# Patient Record
Sex: Male | Born: 1945 | Race: White | Hispanic: No | Marital: Married | Smoking: Former smoker
Health system: Southern US, Community
[De-identification: ages and names within clinical notes are randomized; demographics above are authoritative.]

## PROBLEM LIST (undated history)

## (undated) DIAGNOSIS — J189 Pneumonia, unspecified organism: Secondary | ICD-10-CM

## (undated) DIAGNOSIS — E785 Hyperlipidemia, unspecified: Secondary | ICD-10-CM

## (undated) DIAGNOSIS — H269 Unspecified cataract: Secondary | ICD-10-CM

## (undated) DIAGNOSIS — C801 Malignant (primary) neoplasm, unspecified: Secondary | ICD-10-CM

## (undated) DIAGNOSIS — L409 Psoriasis, unspecified: Secondary | ICD-10-CM

## (undated) DIAGNOSIS — I10 Essential (primary) hypertension: Secondary | ICD-10-CM

## (undated) DIAGNOSIS — H9190 Unspecified hearing loss, unspecified ear: Secondary | ICD-10-CM

## (undated) DIAGNOSIS — M1990 Unspecified osteoarthritis, unspecified site: Secondary | ICD-10-CM

## (undated) DIAGNOSIS — I447 Left bundle-branch block, unspecified: Secondary | ICD-10-CM

## (undated) DIAGNOSIS — R011 Cardiac murmur, unspecified: Secondary | ICD-10-CM

## (undated) HISTORY — DX: Unspecified cataract: H26.9

## (undated) HISTORY — PX: OTHER SURGICAL HISTORY: SHX169

## (undated) HISTORY — PX: COLONOSCOPY: SHX174

## (undated) HISTORY — DX: Unspecified osteoarthritis, unspecified site: M19.90

## (undated) HISTORY — DX: Hyperlipidemia, unspecified: E78.5

---

## 2002-12-24 ENCOUNTER — Encounter: Payer: Self-pay | Admitting: Gastroenterology

## 2002-12-24 ENCOUNTER — Encounter: Admission: RE | Admit: 2002-12-24 | Discharge: 2002-12-24 | Payer: Self-pay | Admitting: Gastroenterology

## 2009-10-11 ENCOUNTER — Ambulatory Visit (HOSPITAL_COMMUNITY): Admission: RE | Admit: 2009-10-11 | Discharge: 2009-10-11 | Payer: Self-pay | Admitting: Cardiology

## 2009-10-11 HISTORY — PX: CARDIAC CATHETERIZATION: SHX172

## 2013-02-26 ENCOUNTER — Other Ambulatory Visit: Payer: Self-pay

## 2013-02-26 DIAGNOSIS — R1030 Lower abdominal pain, unspecified: Secondary | ICD-10-CM

## 2013-02-27 ENCOUNTER — Ambulatory Visit
Admission: RE | Admit: 2013-02-27 | Discharge: 2013-02-27 | Disposition: A | Discharge status: Home / Self Care | Payer: Medicare Other | Source: Ambulatory Visit

## 2013-02-27 DIAGNOSIS — R1030 Lower abdominal pain, unspecified: Secondary | ICD-10-CM

## 2013-03-11 ENCOUNTER — Ambulatory Visit (INDEPENDENT_AMBULATORY_CARE_PROVIDER_SITE_OTHER): Payer: Self-pay | Admitting: General Surgery

## 2013-04-04 ENCOUNTER — Ambulatory Visit (INDEPENDENT_AMBULATORY_CARE_PROVIDER_SITE_OTHER): Payer: Medicare Other | Admitting: General Surgery

## 2013-04-04 ENCOUNTER — Encounter (INDEPENDENT_AMBULATORY_CARE_PROVIDER_SITE_OTHER): Payer: Self-pay | Admitting: General Surgery

## 2013-04-04 VITALS — BP 160/98 | HR 82 | Resp 16 | Ht 71.0 in | Wt 220.4 lb

## 2013-04-04 DIAGNOSIS — D171 Benign lipomatous neoplasm of skin and subcutaneous tissue of trunk: Secondary | ICD-10-CM

## 2013-04-04 DIAGNOSIS — D1739 Benign lipomatous neoplasm of skin and subcutaneous tissue of other sites: Secondary | ICD-10-CM

## 2013-04-04 NOTE — Progress Notes (Signed)
Patient ID: Paul Pineda, male   DOB: [birth year], 67 y.o.   MRN: [id number]  Chief Complaint  Patient presents with  . New Evaluation    eval lipoma in groin area    HPI Paul Pineda is a 67 y.o. male.  The patient is a 67 year old male who was referred by Dr. Kateri Plummer for an evaluation of the right inguinal lipoma. Patient has undergone ultrasound which reveals a lipoma. Patient states she is uncomfortable sensation in that area. He states it does not reduce and is not get smaller.  HPI  Past Medical History  Diagnosis Date  . Arthritis   . Hyperlipidemia     History reviewed. No pertinent past surgical history.  Family History  Problem Relation Age of Onset  . Cancer Father     brain    Social History History  Substance Use Topics  . Smoking status: Former Smoker    Quit date: 04/04/1985  . Smokeless tobacco: Never Used  . Alcohol Use: 8.4 oz/week    14 Cans of beer per week    No Known Allergies  Current Outpatient Prescriptions  Medication Sig Dispense Refill  . ENBREL 25 MG injection       . lisinopril (PRINIVIL,ZESTRIL) 20 MG tablet       . metoprolol succinate (TOPROL-XL) 100 MG 24 hr tablet       . fluorouracil (EFUDEX) 5 % cream       . predniSONE (DELTASONE) 10 MG tablet        No current facility-administered medications for this visit.    Review of Systems Review of Systems  Constitutional: Negative.   HENT: Negative.   Respiratory: Negative.   Cardiovascular: Negative.   Gastrointestinal: Negative.   Neurological: Negative.   All other systems reviewed and are negative.    Blood pressure 160/98, pulse 82, resp. rate 16, height 5\' 11"  (1.803 m), weight 220 lb 6.4 oz (99.973 kg).  Physical Exam Physical Exam  Constitutional: He is oriented to person, place, and time. He appears well-developed and well-nourished.  HENT:  Head: Normocephalic and atraumatic.  Eyes: EOM are normal. Pupils are equal, round, and reactive to light.  Neck:  Normal range of motion. Neck supple.  Cardiovascular: Normal rate, regular rhythm and normal heart sounds.   Pulmonary/Chest: Effort normal and breath sounds normal.  Abdominal: Soft. Bowel sounds are normal. No hernia. Hernia confirmed negative in the ventral area.    Musculoskeletal: Normal range of motion.  Neurological: He is alert and oriented to person, place, and time.  Skin: Skin is warm and dry.    Data Reviewed Ultrasound reveals signs consistent with a lipoma  Assessment    67 year old male with a right inguinal lipoma versus a possible hernia     Plan    1. We'll proceed to the operating room for excision of right inguinal lipoma. I discussed with patient this could be a hernia at which time we would proceed with a right inguinal hernia repair. 2. All risks and benefits were discussed with the patient, to generally include infection, bleeding, damage to surrounding structures, acute and chronic nerve pain, and recurrence. Alternatives were offered and described.  All questions were answered and the patient voiced understanding of the procedure and wishes to proceed at this point.         Marigene Ehlers., Paul Pineda 04/04/2013, 1:48 PM

## 2013-04-23 ENCOUNTER — Encounter (HOSPITAL_COMMUNITY): Payer: Self-pay | Admitting: Pharmacy Technician

## 2013-04-25 NOTE — Pre-Procedure Instructions (Addendum)
[facility]  04/25/2013   Your procedure is scheduled on:  04/29/13  Report to  short stay admitting at 845 AM.  Call this number if you have problems the morning of surgery: [phone]   Remember:   Do not eat food or drink liquids after midnight.   Take these medicines the morning of surgery with A SIP OF WATER: metoprolol   Do not wear jewelry, make-up or nail polish.  Do not wear lotions, powders, or perfumes. You may wear deodorant.  Do not shave 48 hours prior to surgery. Men may shave face and neck.  Do not bring valuables to the hospital.  [facility] is not responsible                  for any belongings or valuables.               Contacts, dentures or bridgework may not be worn into surgery.  Leave suitcase in the car. After surgery it may be brought to your room.  For patients admitted to the hospital, discharge time is determined by your                treatment team.               Patients discharged the day of surgery will not be allowed to drive  home.  Name and phone number of your driver:   Special Instructions: Shower using CHG 2 nights before surgery and the night before surgery.  If you shower the day of surgery use CHG.  Use special wash - you have one bottle of CHG for all showers.  You should use approximately 1/3 of the bottle for each shower.   Please read over the following fact sheets that you were given: Pain Booklet, Coughing and Deep Breathing and Surgical Site Infection Prevention

## 2013-04-28 ENCOUNTER — Encounter (HOSPITAL_COMMUNITY)
Admission: RE | Admit: 2013-04-28 | Discharge: 2013-04-28 | Disposition: A | Discharge status: Home / Self Care | Payer: Medicare Other | Source: Ambulatory Visit | Attending: General Surgery | Admitting: General Surgery

## 2013-04-28 ENCOUNTER — Encounter (HOSPITAL_COMMUNITY)
Admission: RE | Admit: 2013-04-28 | Discharge: 2013-04-28 | Disposition: A | Discharge status: Home / Self Care | Payer: Medicare Other | Source: Ambulatory Visit | Attending: Anesthesiology | Admitting: Anesthesiology

## 2013-04-28 ENCOUNTER — Encounter (HOSPITAL_COMMUNITY): Payer: Self-pay

## 2013-04-28 HISTORY — DX: Malignant (primary) neoplasm, unspecified: C80.1

## 2013-04-28 HISTORY — DX: Pneumonia, unspecified organism: J18.9

## 2013-04-28 HISTORY — DX: Cardiac murmur, unspecified: R01.1

## 2013-04-28 HISTORY — DX: Left bundle-branch block, unspecified: I44.7

## 2013-04-28 HISTORY — DX: Essential (primary) hypertension: I10

## 2013-04-28 LAB — CBC
MCHC: 34.6 g/dL (ref 30.0–36.0)
MCV: 100.4 fL — ABNORMAL HIGH (ref 78.0–100.0)
Platelets: 143 10*3/uL — ABNORMAL LOW (ref 150–400)
RDW: 13.6 % (ref 11.5–15.5)
WBC: 6.7 10*3/uL (ref 4.0–10.5)

## 2013-04-28 LAB — BASIC METABOLIC PANEL
BUN: 16 mg/dL (ref 6–23)
CO2: 24 mEq/L (ref 19–32)
Calcium: 9.2 mg/dL (ref 8.4–10.5)
Chloride: 105 mEq/L (ref 96–112)
Creatinine, Ser: 1 mg/dL (ref 0.50–1.35)
GFR calc Af Amer: 89 mL/min — ABNORMAL LOW (ref >90)
Glucose, Bld: 91 mg/dL (ref 70–99)

## 2013-04-28 LAB — HEPATIC FUNCTION PANEL
AST: 62 U/L — ABNORMAL HIGH (ref 0–37)
Bilirubin, Direct: 0.2 mg/dL (ref 0.0–0.3)

## 2013-04-28 MED ORDER — CEFAZOLIN SODIUM-DEXTROSE 2-3 GM-% IV SOLR
2.0000 g | INTRAVENOUS | Status: DC
  Filled 2013-04-28: qty 50

## 2013-04-28 NOTE — Progress Notes (Signed)
Anesthesia Chart Review:  Patient is a 67 year old male scheduled for right inguinal lipoma removal, possible right IHR on 04/29/13 by Dr. Derrell Lolling.  PAT appointment was this morning (04/28/13).  I was not asked to see patient, but was given his chart to review this afternoon.  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 Donato Schultz. He ultimately had a cardiac cath to evaluate for LV dysfunction.  Cath showed no significant CAD, EF 45-50%.  He was started on ACEI and b-blocker therapy.  Current medication list includes lisinopril and Toprol.   Following an abnormal nuclear stress test showing no ischemia, but EF 37% in 2011, patient underwent a cardiac cath on 10/11/09 that showed no angiographically significant CAD, mildly reduced EF of 45-50% with no wall motion abnormalities, no significant MR,  Insignificant peak-to-peak gradient across the aortic valve.  Mildly reduced EF was felt to be either due to HTN or possible ETOH use, and he was treated medically with ACEI and b-blocker therapy.    Echo on 10/01/09 showed normal Lv chamber size with moderately reduced contraction, anteroseptal wall hypokinesis, LVEF 40-45%, mild MR, trivial TR, grade I diastolic dysfunction without elevated LA pressure.  EKG on 04/28/13 showed NSR, LAD, left BBB. 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   CXR on 04/28/13 showed no acute findings.  Preoperative labs noted.  Cr 1.0, glucose 91.  H/H WNL.  PLT 143K.  AST/ALT 62, 66 (HFP added due to history of ETOH use/fatty liver).  Patient has known left BBB with cardiology evaluation in 2011 showing no significant CAD with mildly reduced EF of 45-50%.  He remains on b-blocker and ACEI therapy, and was referred to [facility] by his PCP.   He will be evaluated by his assigned anesthesiologist on the day of surgery, but if no acute CV/CHF symptoms then I would anticipate that he could proceed as planned.  Velna Ochs [facility] Short Stay Center/Anesthesiology Phone [phone] 04/28/2013 3:01 PM

## 2013-04-28 NOTE — Progress Notes (Signed)
req'd notes, cath from [facility] card 2012 or 2013

## 2013-04-29 ENCOUNTER — Ambulatory Visit (HOSPITAL_COMMUNITY): Payer: Medicare Other | Admitting: Anesthesiology

## 2013-04-29 ENCOUNTER — Ambulatory Visit (HOSPITAL_COMMUNITY)
Admission: RE | Admit: 2013-04-29 | Discharge: 2013-04-29 | Disposition: A | Discharge status: Home / Self Care | Payer: Medicare Other | Source: Ambulatory Visit | Attending: General Surgery | Admitting: General Surgery

## 2013-04-29 ENCOUNTER — Encounter (HOSPITAL_COMMUNITY): Payer: Self-pay | Admitting: *Deleted

## 2013-04-29 ENCOUNTER — Encounter (HOSPITAL_COMMUNITY): Payer: Self-pay | Admitting: Vascular Surgery

## 2013-04-29 ENCOUNTER — Encounter (HOSPITAL_COMMUNITY)
Admission: RE | Disposition: A | Discharge status: Home / Self Care | Payer: Self-pay | Source: Ambulatory Visit | Attending: General Surgery

## 2013-04-29 DIAGNOSIS — Z79899 Other long term (current) drug therapy: Secondary | ICD-10-CM | POA: Insufficient documentation

## 2013-04-29 DIAGNOSIS — D176 Benign lipomatous neoplasm of spermatic cord: Secondary | ICD-10-CM | POA: Insufficient documentation

## 2013-04-29 DIAGNOSIS — Z01818 Encounter for other preprocedural examination: Secondary | ICD-10-CM | POA: Insufficient documentation

## 2013-04-29 DIAGNOSIS — K4090 Unilateral inguinal hernia, without obstruction or gangrene, not specified as recurrent: Secondary | ICD-10-CM

## 2013-04-29 DIAGNOSIS — D1739 Benign lipomatous neoplasm of skin and subcutaneous tissue of other sites: Secondary | ICD-10-CM

## 2013-04-29 DIAGNOSIS — Z01812 Encounter for preprocedural laboratory examination: Secondary | ICD-10-CM | POA: Insufficient documentation

## 2013-04-29 DIAGNOSIS — Z01810 Encounter for preprocedural cardiovascular examination: Secondary | ICD-10-CM | POA: Insufficient documentation

## 2013-04-29 HISTORY — PX: INGUINAL HERNIA REPAIR: SHX194

## 2013-04-29 HISTORY — PX: INSERTION OF MESH: SHX5868

## 2013-04-29 SURGERY — INSERTION OF MESH
Anesthesia: General | Site: Groin | Laterality: Right | Wound class: Clean

## 2013-04-29 MED ORDER — SODIUM CHLORIDE 0.9 % IV SOLN: 12.5000 mg | INTRAVENOUS | Status: DC

## 2013-04-29 MED ORDER — BUPIVACAINE HCL (PF) 0.25 % IJ SOLN
INTRAMUSCULAR | Status: AC
  Filled 2013-04-29: qty 30

## 2013-04-29 MED ORDER — LIDOCAINE HCL (CARDIAC) 20 MG/ML IV SOLN
INTRAVENOUS | Status: DC | PRN
  Administered 2013-04-29: 50 mg via INTRAVENOUS

## 2013-04-29 MED ORDER — PROMETHAZINE HCL 12.5 MG PO TABS: 12.5000 mg | ORAL_TABLET | Freq: Once | ORAL | Status: AC

## 2013-04-29 MED ORDER — SODIUM CHLORIDE 0.9 % IJ SOLN: 3.0000 mL | Freq: Two times a day (BID) | INTRAMUSCULAR | Status: DC

## 2013-04-29 MED ORDER — SODIUM CHLORIDE 0.9 % IJ SOLN: 3.0000 mL | INTRAMUSCULAR | Status: DC | PRN

## 2013-04-29 MED ORDER — EPHEDRINE SULFATE 50 MG/ML IJ SOLN
INTRAMUSCULAR | Status: DC | PRN
  Administered 2013-04-29: 5 mg via INTRAVENOUS
  Administered 2013-04-29 (×2): 10 mg via INTRAVENOUS

## 2013-04-29 MED ORDER — SODIUM CHLORIDE 0.9 % IV SOLN: 250.0000 mL | INTRAVENOUS | Status: DC | PRN

## 2013-04-29 MED ORDER — OXYCODONE HCL 5 MG PO TABS
5.0000 mg | ORAL_TABLET | ORAL | Status: DC | PRN
  Administered 2013-04-29: 5 mg via ORAL

## 2013-04-29 MED ORDER — ACETAMINOPHEN 650 MG RE SUPP: 650.0000 mg | RECTAL | Status: DC | PRN

## 2013-04-29 MED ORDER — MIDAZOLAM HCL 5 MG/5ML IJ SOLN
INTRAMUSCULAR | Status: DC | PRN
  Administered 2013-04-29: 1 mg via INTRAVENOUS

## 2013-04-29 MED ORDER — ROCURONIUM BROMIDE 100 MG/10ML IV SOLN
INTRAVENOUS | Status: DC | PRN
  Administered 2013-04-29: 40 mg via INTRAVENOUS
  Administered 2013-04-29: 10 mg via INTRAVENOUS

## 2013-04-29 MED ORDER — HYDROMORPHONE HCL PF 1 MG/ML IJ SOLN
0.2500 mg | INTRAMUSCULAR | Status: DC | PRN
  Administered 2013-04-29 (×2): 0.5 mg via INTRAVENOUS

## 2013-04-29 MED ORDER — PROPOFOL 10 MG/ML IV BOLUS
INTRAVENOUS | Status: DC | PRN
  Administered 2013-04-29: 150 mg via INTRAVENOUS

## 2013-04-29 MED ORDER — HYDROMORPHONE HCL PF 1 MG/ML IJ SOLN
INTRAMUSCULAR | Status: AC
  Filled 2013-04-29: qty 1

## 2013-04-29 MED ORDER — ACETAMINOPHEN 325 MG PO TABS: 650.0000 mg | ORAL_TABLET | ORAL | Status: DC | PRN

## 2013-04-29 MED ORDER — 0.9 % SODIUM CHLORIDE (POUR BTL) OPTIME
TOPICAL | Status: DC | PRN
  Administered 2013-04-29: 1000 mL

## 2013-04-29 MED ORDER — NEOSTIGMINE METHYLSULFATE 1 MG/ML IJ SOLN
INTRAMUSCULAR | Status: DC | PRN
  Administered 2013-04-29: 4 mg via INTRAVENOUS

## 2013-04-29 MED ORDER — BUPIVACAINE-EPINEPHRINE PF 0.25-1:200000 % IJ SOLN
INTRAMUSCULAR | Status: DC | PRN
  Administered 2013-04-29: 19 mL

## 2013-04-29 MED ORDER — FENTANYL CITRATE 0.05 MG/ML IJ SOLN
INTRAMUSCULAR | Status: DC | PRN
  Administered 2013-04-29: 125 ug via INTRAVENOUS
  Administered 2013-04-29: 75 ug via INTRAVENOUS
  Administered 2013-04-29 (×2): 25 ug via INTRAVENOUS

## 2013-04-29 MED ORDER — CHLORHEXIDINE GLUCONATE 4 % EX LIQD: 1.0000 "application " | Freq: Once | CUTANEOUS | Status: DC

## 2013-04-29 MED ORDER — ONDANSETRON HCL 4 MG/2ML IJ SOLN: 4.0000 mg | Freq: Four times a day (QID) | INTRAMUSCULAR | Status: DC | PRN

## 2013-04-29 MED ORDER — PROMETHAZINE HCL 25 MG/ML IJ SOLN
12.5000 mg | Freq: Once | INTRAMUSCULAR | Status: AC
  Administered 2013-04-29: 12.5 mg via INTRAVENOUS

## 2013-04-29 MED ORDER — LACTATED RINGERS IV SOLN
INTRAVENOUS | Status: DC
  Administered 2013-04-29: 10:00:00 via INTRAVENOUS

## 2013-04-29 MED ORDER — GLYCOPYRROLATE 0.2 MG/ML IJ SOLN
INTRAMUSCULAR | Status: DC | PRN
  Administered 2013-04-29: .7 mg via INTRAVENOUS

## 2013-04-29 MED ORDER — ARTIFICIAL TEARS OP OINT
TOPICAL_OINTMENT | OPHTHALMIC | Status: DC | PRN
  Administered 2013-04-29: 1 via OPHTHALMIC

## 2013-04-29 MED ORDER — OXYCODONE HCL 5 MG PO TABS
ORAL_TABLET | ORAL | Status: AC
  Filled 2013-04-29: qty 1

## 2013-04-29 MED ORDER — ONDANSETRON HCL 4 MG/2ML IJ SOLN
INTRAMUSCULAR | Status: DC | PRN
  Administered 2013-04-29: 4 mg via INTRAVENOUS

## 2013-04-29 MED ORDER — OXYCODONE-ACETAMINOPHEN 10-325 MG PO TABS: 1.0000 | ORAL_TABLET | ORAL | Status: DC | PRN

## 2013-04-29 MED ORDER — PROMETHAZINE HCL 25 MG/ML IJ SOLN
INTRAMUSCULAR | Status: AC
  Administered 2013-04-29: 12.5 mg via INTRAVENOUS
  Filled 2013-04-29: qty 1

## 2013-04-29 MED ORDER — PROMETHAZINE HCL 12.5 MG RE SUPP: 12.5000 mg | Freq: Once | RECTAL | Status: AC

## 2013-04-29 MED ORDER — LACTATED RINGERS IV SOLN
INTRAVENOUS | Status: DC | PRN
  Administered 2013-04-29 (×2): via INTRAVENOUS

## 2013-04-29 SURGICAL SUPPLY — 62 items
ADH SKN CLS APL DERMABOND .7 (GAUZE/BANDAGES/DRESSINGS) ×2
BLADE SURG 10 STRL SS (BLADE) ×4 IMPLANT
BLADE SURG 15 STRL LF DISP TIS (BLADE) ×3 IMPLANT
BLADE SURG 15 STRL SS (BLADE) ×3
BLADE SURG ROTATE 9660 (MISCELLANEOUS) IMPLANT
CHLORAPREP W/TINT 26ML (MISCELLANEOUS) ×3 IMPLANT
CLOTH BEACON ORANGE TIMEOUT ST (SAFETY) ×4 IMPLANT
COVER SURGICAL LIGHT HANDLE (MISCELLANEOUS) ×4 IMPLANT
DERMABOND ADVANCED (GAUZE/BANDAGES/DRESSINGS) ×1
DERMABOND ADVANCED .7 DNX12 (GAUZE/BANDAGES/DRESSINGS) ×2 IMPLANT
DRAIN PENROSE 1/2X12 LTX STRL (WOUND CARE) IMPLANT
DRAPE LAPAROTOMY TRNSV 102X78 (DRAPE) ×3 IMPLANT
DRAPE ORTHO SPLIT 77X108 STRL (DRAPES)
DRAPE PED LAPAROTOMY (DRAPES) IMPLANT
DRAPE SURG ORHT 6 SPLT 77X108 (DRAPES) IMPLANT
DRAPE UTILITY 15X26 W/TAPE STR (DRAPE) ×6 IMPLANT
ELECT CAUTERY BLADE 6.4 (BLADE) ×4 IMPLANT
ELECT REM PT RETURN 9FT ADLT (ELECTROSURGICAL) ×3
ELECTRODE REM PT RTRN 9FT ADLT (ELECTROSURGICAL) ×3 IMPLANT
GLOVE BIO SURGEON STRL SZ7.5 (GLOVE) ×4 IMPLANT
GLOVE BIO SURGEON STRL SZ8 (GLOVE) ×2 IMPLANT
GLOVE BIOGEL PI IND STRL 7.0 (GLOVE) ×3 IMPLANT
GLOVE BIOGEL PI IND STRL 8 (GLOVE) ×4 IMPLANT
GLOVE BIOGEL PI INDICATOR 7.0 (GLOVE) ×3
GLOVE BIOGEL PI INDICATOR 8 (GLOVE) ×2
GLOVE SURG SS PI 6.5 STRL IVOR (GLOVE) ×2 IMPLANT
GLOVE SURG SS PI 7.0 STRL IVOR (GLOVE) ×2 IMPLANT
GOWN STRL NON-REIN LRG LVL3 (GOWN DISPOSABLE) ×7 IMPLANT
GOWN STRL REIN XL XLG (GOWN DISPOSABLE) ×6 IMPLANT
KIT BASIN OR (CUSTOM PROCEDURE TRAY) ×4 IMPLANT
KIT ROOM TURNOVER OR (KITS) ×4 IMPLANT
MESH ULTRAPRO 3X6 7.6X15CM (Mesh General) ×2 IMPLANT
NDL HYPO 25GX1X1/2 BEV (NEEDLE) ×1 IMPLANT
NEEDLE 22X1 1/2 (OR ONLY) (NEEDLE) ×2 IMPLANT
NEEDLE HYPO 25GX1X1/2 BEV (NEEDLE) ×3 IMPLANT
NS IRRIG 1000ML POUR BTL (IV SOLUTION) ×4 IMPLANT
PACK SURGICAL SETUP 50X90 (CUSTOM PROCEDURE TRAY) ×4 IMPLANT
PAD ARMBOARD 7.5X6 YLW CONV (MISCELLANEOUS) ×4 IMPLANT
PENCIL BUTTON HOLSTER BLD 10FT (ELECTRODE) ×4 IMPLANT
SPECIMEN JAR SMALL (MISCELLANEOUS) ×1 IMPLANT
SPONGE GAUZE 4X4 12PLY (GAUZE/BANDAGES/DRESSINGS) IMPLANT
SPONGE INTESTINAL PEANUT (DISPOSABLE) IMPLANT
SPONGE LAP 18X18 X RAY DECT (DISPOSABLE) ×4 IMPLANT
SUT MNCRL AB 4-0 PS2 18 (SUTURE) ×1 IMPLANT
SUT PDS AB 0 CT 36 (SUTURE) IMPLANT
SUT PROLENE 2 0 SH DA (SUTURE) ×1 IMPLANT
SUT SILK 2 0 SH (SUTURE) IMPLANT
SUT SILK 3 0 (SUTURE)
SUT SILK 3-0 18XBRD TIE 12 (SUTURE) ×1 IMPLANT
SUT VIC AB 0 CT2 27 (SUTURE) IMPLANT
SUT VIC AB 2-0 SH 27 (SUTURE)
SUT VIC AB 2-0 SH 27X BRD (SUTURE) ×1 IMPLANT
SUT VIC AB 3-0 SH 27 (SUTURE)
SUT VIC AB 3-0 SH 27XBRD (SUTURE) ×1 IMPLANT
SYR CONTROL 10ML LL (SYRINGE) ×3 IMPLANT
TOWEL OR 17X24 6PK STRL BLUE (TOWEL DISPOSABLE) ×4 IMPLANT
TOWEL OR 17X26 10 PK STRL BLUE (TOWEL DISPOSABLE) ×4 IMPLANT
TRAY FOLEY CATH 14FR (SET/KITS/TRAYS/PACK) ×2 IMPLANT
TRAY FOLEY CATH 16FRSI W/METER (SET/KITS/TRAYS/PACK) IMPLANT
TUBE CONNECTING 12X1/4 (SUCTIONS) IMPLANT
UNDERPAD 30X30 INCONTINENT (UNDERPADS AND DIAPERS) IMPLANT
YANKAUER SUCT BULB TIP NO VENT (SUCTIONS) IMPLANT

## 2013-04-29 NOTE — Progress Notes (Signed)
Dr Randa Evens called and informed of pt unable to remove gold colored ring to left ring finger, with several attempts.

## 2013-04-29 NOTE — Progress Notes (Signed)
Patient was nauseated after drinking several cups of water.  Anesthesiologist called, order obtained for Phenergan for nausea.  Pt stated nausea was much better after phenergan, and pain was 2/10 after dilaudid.

## 2013-04-29 NOTE — Interval H&P Note (Signed)
History and Physical Interval Note:  04/29/2013 10:52 AM  Paul Pineda  has presented today for surgery, with the diagnosis of hernia  The various methods of treatment have been discussed with the patient and family. After consideration of risks, benefits and other options for treatment, the patient has consented to  Procedure(s): RIGHT INGUINAL LIPOMA REMOVAL VS RIGHT INGUINAL HERNIA WITH MESH (Right) INSERTION OF MESH (Right) as a surgical intervention .  The patient's history has been reviewed, patient examined, no change in status, stable for surgery.  I have reviewed the patient's chart and labs.  Questions were answered to the patient's satisfaction.     Marigene Ehlers., Jed Limerick

## 2013-04-29 NOTE — Progress Notes (Signed)
Care of pt assumed by MA Karlisa Gaubert RN from L Yontz RN 

## 2013-04-29 NOTE — Anesthesia Preprocedure Evaluation (Addendum)
Anesthesia Evaluation  Patient identified by MRN, date of birth, ID band Patient awake    Reviewed: Allergy & Precautions, H&P , NPO status , Patient's Chart, lab work & pertinent test results  Airway Mallampati: II      Dental   Pulmonary pneumonia -, resolved,          Cardiovascular hypertension, Pt. on medications + dysrhythmias     Neuro/Psych    GI/Hepatic negative GI ROS, Neg liver ROS,   Endo/Other  negative endocrine ROS  Renal/GU negative Renal ROS     Musculoskeletal   Abdominal   Peds  Hematology   Anesthesia Other Findings   Reproductive/Obstetrics                         Anesthesia Physical Anesthesia Plan  ASA: III  Anesthesia Plan:    Post-op Pain Management:    Induction: Intravenous  Airway Management Planned: LMA  Additional Equipment:   Intra-op Plan:   Post-operative Plan: Extubation in OR  Informed Consent: I have reviewed the patients History and Physical, chart, labs and discussed the procedure including the risks, benefits and alternatives for the proposed anesthesia with the patient or authorized representative who has indicated his/her understanding and acceptance.   Dental advisory given  Plan Discussed with: CRNA, Anesthesiologist and Surgeon  Anesthesia Plan Comments:         Anesthesia Quick Evaluation

## 2013-04-29 NOTE — Op Note (Signed)
Pre Operative Diagnosis:  Right subcutaneous inguinal lipoma and questionable Right inguinal hernia  Post Operative Diagnosis: right subcutaneous inguinal lipoma, and a cord lipoma  Procedure: excision of a right subcutaneous inguinal lipomameasuring 9 0.5 x 3 cm in size, and a right inguinal hernia repair with mesh  Surgeon: Dr. Axel Filler  Assistant: none  Anesthesia: GETA  EBL: less than 5 cc  Complications: none  Counts: reported as correct x 2  Findings:  9.5 x 3 cm sub-q lipoma and a cord lipoma.  Indications for procedure:  The patient is a 67 year old male with a long history of a right inguinal subcutaneous lipoma. It is difficult to determine whether or not the patient had a small hernia that was felt to be on exam. After counseling the patient said to have this electively excised and hernia repaired it was present.  Details of the procedure: The patient was taken back to the operating room. The patient was placed in supine position with bilateral SCDs in place. After appropriate anitbiotics were confirmed, a time-out was confirmed and all facts were verified.  Quarter percent Marcaine was used to infiltrate the area of the incision and an ilioinguinal nerve block was also placed. A 5 cm incision was made just 1 cm superior to the inguinal ligament. Physician subcutaneous fat take place and the lipoma was defined and excised in its entirety. After excision of the lipoma measured approximately 9 cm x 3 cm.  I proceeded to dissect down to the hernia and bovie cautery was used to maintain hemostasis and dissection was carried down to the external oblique.  A standard incision was made laterally, and the external oblique was bluntly dissected away from the surrounding tissue with Metzenbaum scissors. Incision was made through the external oblique and lateral stab wound through the external ring. The external oblique was elevated in the spermatic cord was bluntly dissected away from  the surrounding tissue.  The ilioinguinal nerve was identified and ligated proximally. The spermatic cord and the hernia were then bluntly dissected away from the pubic tubercle and a Penrose was placed around the hernia sac in the spermatic cord. Dissection cremasterics to place a Bovie cautery.the spermatic cord was dissected away and the vas deferens was identified and protected all portions of the case. There is a small cord lipoma that was excised and suture-ligated. This retracted to the abdomen. At this time a 3 x 6 piece of Prolene mesh was then anchored to the pubic tubercle with a 2-0 Prolene.  It was anchored to the shelving edge of the external oblique.  The superior edge was fixated to the conjoint tendon with a running Prolene.  The tails were then cut into shape and tucked under the external oblique. The mesh surrounding the spermatic cord at the internal oblique was then tightened to allow approximately 1 fingerbreadth. At this time the area was irrigated out with sterile saline.  The external oblique was reapproximated using a 2-0 Vicryl in a running fashion. Scarpa's fascia was then reapproximated using a 3-0 Vicryl running fashion. The skin was then reapproximated with 4 Monocryl in a subcuticular fashion. The skin was then dressed with Dermabond.  The patient was taken to the recovery room in stable condition.

## 2013-04-29 NOTE — Preoperative (Signed)
Beta Blockers   Reason not to administer Beta Blockers:toprol taken today 

## 2013-04-29 NOTE — Anesthesia Postprocedure Evaluation (Signed)
  Anesthesia Post-op Note  Patient: Paul Pineda  Procedure(s) Performed: Procedure(s): INSERTION OF MESH (Right) HERNIA REPAIR INGUINAL ADULT (Right)  Patient Location: PACU  Anesthesia Type:General  Level of Consciousness: awake  Airway and Oxygen Therapy: Patient Spontanous Breathing  Post-op Pain: mild  Post-op Assessment: Post-op Vital signs reviewed  Post-op Vital Signs: stable  Complications: No apparent anesthesia complications

## 2013-04-29 NOTE — H&P (View-Only) (Signed)
Patient ID: Paul Pineda, male   DOB: September 15, 1945, 67 y.o.   MRN: [id number]  Chief Complaint  Patient presents with  . New Evaluation    eval lipoma in groin area    HPI Paul Pineda is a 67 y.o. male.  The patient is a 67 year old male who was referred by Dr. Kateri Plummer for an evaluation of the right inguinal lipoma. Patient has undergone ultrasound which reveals a lipoma. Patient states she is uncomfortable sensation in that area. He states it does not reduce and is not get smaller.  HPI  Past Medical History  Diagnosis Date  . Arthritis   . Hyperlipidemia     History reviewed. No pertinent past surgical history.  Family History  Problem Relation Age of Onset  . Cancer Father     brain    Social History History  Substance Use Topics  . Smoking status: Former Smoker    Quit date: 04/04/1985  . Smokeless tobacco: Never Used  . Alcohol Use: 8.4 oz/week    14 Cans of beer per week    No Known Allergies  Current Outpatient Prescriptions  Medication Sig Dispense Refill  . ENBREL 25 MG injection       . lisinopril (PRINIVIL,ZESTRIL) 20 MG tablet       . metoprolol succinate (TOPROL-XL) 100 MG 24 hr tablet       . fluorouracil (EFUDEX) 5 % cream       . predniSONE (DELTASONE) 10 MG tablet        No current facility-administered medications for this visit.    Review of Systems Review of Systems  Constitutional: Negative.   HENT: Negative.   Respiratory: Negative.   Cardiovascular: Negative.   Gastrointestinal: Negative.   Neurological: Negative.   All other systems reviewed and are negative.    Blood pressure 160/98, pulse 82, resp. rate 16, height 5\' 11"  (1.803 m), weight 220 lb 6.4 oz (99.973 kg).  Physical Exam Physical Exam  Constitutional: He is oriented to person, place, and time. He appears well-developed and well-nourished.  HENT:  Head: Normocephalic and atraumatic.  Eyes: EOM are normal. Pupils are equal, round, and reactive to light.  Neck:  Normal range of motion. Neck supple.  Cardiovascular: Normal rate, regular rhythm and normal heart sounds.   Pulmonary/Chest: Effort normal and breath sounds normal.  Abdominal: Soft. Bowel sounds are normal. No hernia. Hernia confirmed negative in the ventral area.    Musculoskeletal: Normal range of motion.  Neurological: He is alert and oriented to person, place, and time.  Skin: Skin is warm and dry.    Data Reviewed Ultrasound reveals signs consistent with a lipoma  Assessment    67 year old male with a right inguinal lipoma versus a possible hernia     Plan    1. We'll proceed to the operating room for excision of right inguinal lipoma. I discussed with patient this could be a hernia at which time we would proceed with a right inguinal hernia repair. 2. All risks and benefits were discussed with the patient, to generally include infection, bleeding, damage to surrounding structures, acute and chronic nerve pain, and recurrence. Alternatives were offered and described.  All questions were answered and the patient voiced understanding of the procedure and wishes to proceed at this point.         Marigene Ehlers., Shloka Baldridge 04/04/2013, 1:48 PM

## 2013-04-29 NOTE — Transfer of Care (Signed)
Immediate Anesthesia Transfer of Care Note  Patient: Paul Pineda  Procedure(s) Performed: Procedure(s): INSERTION OF MESH (Right) HERNIA REPAIR INGUINAL ADULT (Right)  Patient Location: PACU  Anesthesia Type:General  Level of Consciousness: awake, alert  and oriented  Airway & Oxygen Therapy: Patient Spontanous Breathing and Patient connected to nasal cannula oxygen  Post-op Assessment: Report given to PACU RN and Post -op Vital signs reviewed and stable  Post vital signs: Reviewed and stable  Complications: No apparent anesthesia complications

## 2013-04-29 NOTE — Progress Notes (Signed)
Dr Derrell Lolling called and informed of the gold band to pt left ring finger.  Spoke with Asher Muir in the OR, to let the Dr know.

## 2013-05-02 ENCOUNTER — Encounter (HOSPITAL_COMMUNITY): Payer: Self-pay | Admitting: General Surgery

## 2013-05-19 ENCOUNTER — Encounter (INDEPENDENT_AMBULATORY_CARE_PROVIDER_SITE_OTHER): Payer: Medicare Other | Admitting: General Surgery

## 2013-06-05 ENCOUNTER — Other Ambulatory Visit: Payer: Self-pay

## 2013-06-23 NOTE — Progress Notes (Signed)
Surgery scheduled for 07/14/13.  Preop on 07/04/13 at 100pm.  Need orders in EPIC.  Thank You.

## 2013-07-01 ENCOUNTER — Encounter (HOSPITAL_COMMUNITY): Payer: Self-pay | Admitting: Pharmacy Technician

## 2013-07-01 ENCOUNTER — Other Ambulatory Visit: Payer: Self-pay | Admitting: Orthopedic Surgery

## 2013-07-01 NOTE — Progress Notes (Signed)
Preoperative surgical orders have been place into the Epic hospital system for Paul Pineda on 07/01/2013, 9:39 AM  by Patrica Duel for surgery on 07-14-2013.  Preop Total Knee orders including Experal, PO Tylenol, and IV Decadron as long as there are no contraindications to the above medications. Avel Peace, PA-C

## 2013-07-02 NOTE — Patient Instructions (Addendum)
20      Your procedure is scheduled on:  Monday 07/14/2013  Report to [facility] at 0730 AM.  Call this number if you have problems the night before or morning of surgery: [phone]   Remember:             IF YOU USE CPAP,BRING MASK AND TUBING AM OF SURGERY!             IF YOU DO NOT HAVE YOUR TYPE AND SCREEN DRAWN AT PRE-ADMIT APPOINTMENT, YOU WILL HAVE IT DRAWN AM OF SURGERY!   Do not eat food or drink liquids AFTER MIDNIGHT!  Take these medicines the morning of surgery with A SIP OF WATER: Metoprolol    [facility] IS NOT RESPONSIBLE FOR ANY BELONGINGS OR VALUABLES BROUGHT TO HOSPITAL.  Marland Kitchen  Leave suitcase in the car. After surgery it may be brought to your room.  For patients admitted to the hospital, checkout time is 11:00 AM the day of              Discharge.    DO NOT WEAR JEWELRY,MAKE-UP,LOTIONS,POWDERS,PERFUMES,CONTACTS , DENTURES OR BRIDGEWORK ,AND DO NOT WEAR FALSE EYELASHES                                    Patients discharged the day of surgery will not be allowed to drive home.  If going home the same day of surgery, must have someone stay with you first 24 hrs.at home and arrange for someone to drive you home from the              Hospital.              YOUR DRIVER IS: N/A   Special Instructions:              Please read over the following fact sheets that you were given:             1. [place] PREPARING FOR SURGERY SHEET              2.INCENTIVE SPIROMETRY                                        [place].Bertice Risse,RN,BSN     [id number]                FAILURE TO FOLLOW THESE INSTRUCTIONS MAY RESULT IN  CANCELLATION OF YOUR SURGERY!               Patient Signature:___________________________

## 2013-07-04 ENCOUNTER — Encounter (HOSPITAL_COMMUNITY)
Admission: RE | Admit: 2013-07-04 | Discharge: 2013-07-04 | Disposition: A | Discharge status: Home / Self Care | Payer: Medicare Other | Source: Ambulatory Visit | Attending: Orthopedic Surgery | Admitting: Orthopedic Surgery

## 2013-07-04 ENCOUNTER — Encounter (HOSPITAL_COMMUNITY): Payer: Self-pay

## 2013-07-04 DIAGNOSIS — M1710 Unilateral primary osteoarthritis, unspecified knee: Secondary | ICD-10-CM | POA: Insufficient documentation

## 2013-07-04 DIAGNOSIS — Z01812 Encounter for preprocedural laboratory examination: Secondary | ICD-10-CM | POA: Insufficient documentation

## 2013-07-04 LAB — URINALYSIS, ROUTINE W REFLEX MICROSCOPIC
Nitrite: NEGATIVE
Protein, ur: 30 mg/dL — AB
Urobilinogen, UA: 1 mg/dL (ref 0.0–1.0)

## 2013-07-04 LAB — SURGICAL PCR SCREEN
MRSA, PCR: NEGATIVE
Staphylococcus aureus: POSITIVE — AB

## 2013-07-04 LAB — COMPREHENSIVE METABOLIC PANEL
ALT: 72 U/L — ABNORMAL HIGH (ref 0–53)
BUN: 20 mg/dL (ref 6–23)
CO2: 25 mEq/L (ref 19–32)
Calcium: 10.1 mg/dL (ref 8.4–10.5)
GFR calc Af Amer: 73 mL/min — ABNORMAL LOW (ref >90)
GFR calc non Af Amer: 63 mL/min — ABNORMAL LOW (ref >90)
Glucose, Bld: 93 mg/dL (ref 70–99)
Sodium: 141 mEq/L (ref 135–145)
Total Protein: 7.5 g/dL (ref 6.0–8.3)

## 2013-07-04 LAB — PROTIME-INR
INR: 0.96 (ref 0.00–1.49)
Prothrombin Time: 12.6 seconds (ref 11.6–15.2)

## 2013-07-04 LAB — CBC
Hemoglobin: 17.4 g/dL — ABNORMAL HIGH (ref 13.0–17.0)
MCH: 34.7 pg — ABNORMAL HIGH (ref 26.0–34.0)
MCHC: 34.7 g/dL (ref 30.0–36.0)
RDW: 12.5 % (ref 11.5–15.5)

## 2013-07-04 LAB — APTT: aPTT: 27 seconds (ref 24–37)

## 2013-07-04 LAB — URINE MICROSCOPIC-ADD ON

## 2013-07-04 NOTE — Progress Notes (Signed)
09/27/2009-EKG FROM [facility],DR.MARK SKAINS ON CHART AND OFFICE VISIT ON CHART. 10/01/2009-NUCLEAR STRESS TEST AND ECHOCARDIOGRAM  FROM [facility] ON CHART. 10/11/2009-CARDIAC CATHERIZATION FROM [place] ON CHART. 10-18-2009-F/U OFFICE VISIT FROM CATH FROM [facility] ON CHART.

## 2013-07-07 NOTE — Progress Notes (Signed)
Fax sent to Dr. 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

## 2013-07-13 ENCOUNTER — Other Ambulatory Visit: Payer: Self-pay | Admitting: Orthopedic Surgery

## 2013-07-13 NOTE — H&P (Signed)
Paul Pineda  DOB: 05/28/1946 Married / Language: English / Race: White Male  Date of Admission:  07-14-2013  Chief Complaint:  Right Knee Pain  History of Present Illness The patient is a 67 year old male who comes in for a preoperative History and Physical. The patient is scheduled for a right total knee arthroplasty to be performed by Dr. Frank V. [name], MD at [facility] on 07/14/2013. The patient was seen for a second opinion. The patient reports right knee symptoms including: pain, swelling, instability, giving way and stiffness which began year(s) ago without any known injury.The patient feels that the symptoms are worsening. The patient has the current diagnosis of knee osteoarthritis. Previous work-up for this problem has included knee x-rays. Past treatment for this problem has included intra-articular injection of corticosteroids (last injection was 07/07/09). Current treatment includes application of ice, restricted activity and nonsteroidal anti-inflammatory drugs (Naproxen).  He is a truck driver. He notes that after driving for a long distance, he feels like his knee cannot support him when he first gets up. He states the previous cortisone injections have only helped for a short period. He also has a history of psoriatic arthritis. The patient's knee has gotten progressively worse over time. He is at a stage where it is hurting him all the time. He has pain at night and during the day. It is limiting what he can and cannot do. He still drives long distance doing truck deliveries. He does not have to do any loading or unloading of the truck. he is not having any significant swelling in the knee. It does give out on him at times. He is not having any left knee pain,hip pain, low back pain, lower extremity weakness or paresthesias associated with this. He has been treated previously by Dr. Dalldorf with injections. The first injection lasted a long time  for several months, then had another injection which did not help as much. He is at a stage now where the knee is far worse than it was when he had the injections. The patient would like to be more active, but cannot do so because of the pain. He is ready to proceed with surgery. Risks and benefits of the surgery have been discussed with the patient and they elect to proceed with surgery.  There are on active contraindications to upcoming procedure such as ongoing infection or progressive neurological disease.   Problem List/Past Medical Primary osteoarthritis of one knee (715.16) High blood pressure Skin Cancer  Allergies No Known Drug Allergies. 05/01/2013    Family History Cancer. Father. father    Social History Illicit drug use. no Living situation. live with spouse Drug/Alcohol Rehab (Previously). no Exercise. Exercises never Marital status. married Tobacco / smoke exposure. no Number of flights of stairs before winded. 2-3 Pain Contract. no Drug/Alcohol Rehab (Currently). no Tobacco use. Former smoker. former smoker; smoke(d) 2 pack(s) per day Children. 2 Current work status. working full time Alcohol use. current drinker; drinks beer and wine; 5-7 per week Post-Surgical Plans. Home Advance Directives. Living Will    Medication History Enbrel ( Subcutaneous) Specific dose unknown - Active. Metoprolol Succinate ER ( Oral) Specific dose unknown - Active. Lisinopril ( Oral) Specific dose unknown - Active. Multivitamins ( Oral) Active.    Past Surgical History Inguinal Hernia Repair. open: right   Review of Systems General:Not Present- Chills, Fever, Night Sweats, Fatigue, Weight Gain, Weight Loss and Memory Loss. Skin:Not Present- Hives, Itching, Rash, Eczema and Lesions.   HEENT:Not Present- Tinnitus, Headache, Double Vision, Visual Loss, Hearing Loss and Dentures. Respiratory:Not Present- Shortness of breath with exertion,  Shortness of breath at rest, Allergies, Coughing up blood and Chronic Cough. Cardiovascular:Not Present- Chest Pain, Racing/skipping heartbeats, Difficulty Breathing Lying Down, Murmur, Swelling and Palpitations. Gastrointestinal:Not Present- Bloody Stool, Heartburn, Abdominal Pain, Vomiting, Nausea, Constipation, Diarrhea, Difficulty Swallowing, Jaundice and Loss of appetitie. Male Genitourinary:Not Present- Urinary frequency, Blood in Urine, Weak urinary stream, Discharge, Flank Pain, Incontinence, Painful Urination, Urgency, Urinary Retention and Urinating at Night. Musculoskeletal:Present- Muscle Pain and Joint Swelling. Not Present- Muscle Weakness, Joint Pain, Back Pain, Morning Stiffness and Spasms. Neurological:Not Present- Tremor, Dizziness, Blackout spells, Paralysis, Difficulty with balance and Weakness. Psychiatric:Not Present- Insomnia.    Vitals Weight: 227 lb Height: 71 in Weight was reported by patient. Height was reported by patient. Body Surface Area: 2.27 m Body Mass Index: 31.66 kg/m Pulse: 68 (Regular) Resp.: 16 (Unlabored) BP: 138/82 (Sitting, Right Arm, Standard)     Physical Exam The physical exam findings are as follows:   General Mental Status - Alert, cooperative and good historian. General Appearance- pleasant. Not in acute distress. Orientation- Oriented X3. Build & Nutrition- Well nourished and Well developed.   Head and Neck Head- normocephalic, atraumatic . Neck Global Assessment- supple. no bruit auscultated on the right and no bruit auscultated on the left.   Eye Vision- Wears corrective lenses. Pupil- Bilateral- Regular and Round. Motion- Bilateral- EOMI.   Chest and Lung Exam Auscultation: Breath sounds:- clear at anterior chest wall and - clear at posterior chest wall. Adventitious sounds:- No Adventitious sounds.   Cardiovascular Auscultation:Rhythm- Regular rate and rhythm. Heart  Sounds- S1 WNL and S2 WNL. Murmurs & Other Heart Sounds: Murmur 1:Location- Aortic Area and Pulmonic Area. Timing- Holosystolic. Grade- III/VI. Character- Crescendo/Decrescendo.   Abdomen Palpation/Percussion:Tenderness- Abdomen is non-tender to palpation. Rigidity (guarding)- Abdomen is soft. Auscultation:Auscultation of the abdomen reveals - Bowel sounds normal.   Male Genitourinary Not done, not pertinent to present illness  Musculoskeletal On exam well developed male alert and oriented in no apparent distress. His hip shows normal range of motion with no discomfort. The left knee no effusion. Range 0 to 140. No tenderness or instability. The right knee has a lot of soft tissue hypertrophy. There is a small effusion. Range about 10 to 120 with marked crepitus on range of motion. Tenderness medial greater than lateral. No instability noted. Pulses sensation and motor intact distally.  RADIOGRAPHS: AP both knees and lateral of the right show the severe endstage arthritis of the right knee tricompartmental bone on bone with large osteophyte formation. The left knee looks normal.   Assessment & Plan Primary osteoarthritis of one knee (715.16) Impression: Right Knee  Note: Plan is for a Right Total Knee Replacement by Dr. [name].  Plan is to go home.  The patient does not have any contraindications and will receive TXA (tranexamic acid) prior to surgery.  Signed electronically by Jaevion Goto L Lynard Postlewait, III PA-C  

## 2013-07-14 ENCOUNTER — Encounter (HOSPITAL_COMMUNITY): Payer: Medicare Other | Admitting: Registered Nurse

## 2013-07-14 ENCOUNTER — Encounter (HOSPITAL_COMMUNITY)
Admission: RE | Disposition: A | Discharge status: Home Health | Payer: Self-pay | Source: Ambulatory Visit | Attending: Orthopedic Surgery

## 2013-07-14 ENCOUNTER — Inpatient Hospital Stay (HOSPITAL_COMMUNITY)
Admission: RE | Admit: 2013-07-14 | Discharge: 2013-07-16 | DRG: 470 | Disposition: A | Discharge status: Home Health | Payer: Medicare Other | Source: Ambulatory Visit | Attending: Orthopedic Surgery | Admitting: Orthopedic Surgery

## 2013-07-14 ENCOUNTER — Encounter (HOSPITAL_COMMUNITY): Payer: Self-pay

## 2013-07-14 ENCOUNTER — Inpatient Hospital Stay (HOSPITAL_COMMUNITY): Payer: Medicare Other | Admitting: Registered Nurse

## 2013-07-14 DIAGNOSIS — Z96651 Presence of right artificial knee joint: Secondary | ICD-10-CM

## 2013-07-14 DIAGNOSIS — E785 Hyperlipidemia, unspecified: Secondary | ICD-10-CM | POA: Diagnosis present

## 2013-07-14 DIAGNOSIS — M1710 Unilateral primary osteoarthritis, unspecified knee: Principal | ICD-10-CM | POA: Diagnosis present

## 2013-07-14 DIAGNOSIS — Z87891 Personal history of nicotine dependence: Secondary | ICD-10-CM

## 2013-07-14 DIAGNOSIS — D62 Acute posthemorrhagic anemia: Secondary | ICD-10-CM | POA: Diagnosis not present

## 2013-07-14 DIAGNOSIS — I447 Left bundle-branch block, unspecified: Secondary | ICD-10-CM | POA: Diagnosis present

## 2013-07-14 DIAGNOSIS — M179 Osteoarthritis of knee, unspecified: Secondary | ICD-10-CM | POA: Diagnosis present

## 2013-07-14 DIAGNOSIS — Z79899 Other long term (current) drug therapy: Secondary | ICD-10-CM

## 2013-07-14 DIAGNOSIS — R011 Cardiac murmur, unspecified: Secondary | ICD-10-CM | POA: Diagnosis present

## 2013-07-14 DIAGNOSIS — I10 Essential (primary) hypertension: Secondary | ICD-10-CM | POA: Diagnosis present

## 2013-07-14 HISTORY — PX: TOTAL KNEE ARTHROPLASTY: SHX125

## 2013-07-14 SURGERY — ARTHROPLASTY, KNEE, TOTAL
Anesthesia: General | Site: Knee | Laterality: Right

## 2013-07-14 MED ORDER — PHENYLEPHRINE HCL 10 MG/ML IJ SOLN
INTRAMUSCULAR | Status: DC | PRN
  Administered 2013-07-14 (×2): 80 ug via INTRAVENOUS
  Administered 2013-07-14 (×2): 20 ug via INTRAVENOUS

## 2013-07-14 MED ORDER — PROPOFOL 10 MG/ML IV BOLUS
INTRAVENOUS | Status: AC
  Filled 2013-07-14: qty 20

## 2013-07-14 MED ORDER — LIDOCAINE HCL (CARDIAC) 20 MG/ML IV SOLN
INTRAVENOUS | Status: AC
  Filled 2013-07-14: qty 5

## 2013-07-14 MED ORDER — DEXAMETHASONE SODIUM PHOSPHATE 10 MG/ML IJ SOLN
10.0000 mg | Freq: Once | INTRAMUSCULAR | Status: AC
  Administered 2013-07-14: 10 mg via INTRAVENOUS

## 2013-07-14 MED ORDER — HYDRALAZINE HCL 20 MG/ML IJ SOLN
INTRAMUSCULAR | Status: AC
  Filled 2013-07-14: qty 1

## 2013-07-14 MED ORDER — SUFENTANIL CITRATE 50 MCG/ML IV SOLN
INTRAVENOUS | Status: AC
  Filled 2013-07-14: qty 1

## 2013-07-14 MED ORDER — ACETAMINOPHEN 325 MG PO TABS: 650.0000 mg | ORAL_TABLET | Freq: Four times a day (QID) | ORAL | Status: DC | PRN

## 2013-07-14 MED ORDER — HYDROMORPHONE HCL PF 1 MG/ML IJ SOLN
0.2500 mg | INTRAMUSCULAR | Status: DC | PRN
  Administered 2013-07-14 (×4): 0.25 mg via INTRAVENOUS

## 2013-07-14 MED ORDER — CEFAZOLIN SODIUM-DEXTROSE 2-3 GM-% IV SOLR
2.0000 g | INTRAVENOUS | Status: AC
  Administered 2013-07-14: 2 g via INTRAVENOUS

## 2013-07-14 MED ORDER — METHOCARBAMOL 500 MG PO TABS
500.0000 mg | ORAL_TABLET | Freq: Four times a day (QID) | ORAL | Status: DC | PRN
  Administered 2013-07-15 (×2): 500 mg via ORAL
  Filled 2013-07-14 (×3): qty 1

## 2013-07-14 MED ORDER — SODIUM CHLORIDE 0.9 % IJ SOLN
INTRAMUSCULAR | Status: AC
  Filled 2013-07-14: qty 10

## 2013-07-14 MED ORDER — DIPHENHYDRAMINE HCL 12.5 MG/5ML PO ELIX: 12.5000 mg | ORAL_SOLUTION | ORAL | Status: DC | PRN

## 2013-07-14 MED ORDER — ROCURONIUM BROMIDE 100 MG/10ML IV SOLN
INTRAVENOUS | Status: DC | PRN
  Administered 2013-07-14: 15 mg via INTRAVENOUS
  Administered 2013-07-14: 5 mg via INTRAVENOUS

## 2013-07-14 MED ORDER — PHENOL 1.4 % MT LIQD: 1.0000 | OROMUCOSAL | Status: DC | PRN

## 2013-07-14 MED ORDER — MORPHINE SULFATE 2 MG/ML IJ SOLN: 1.0000 mg | INTRAMUSCULAR | Status: DC | PRN

## 2013-07-14 MED ORDER — PHENYLEPHRINE 40 MCG/ML (10ML) SYRINGE FOR IV PUSH (FOR BLOOD PRESSURE SUPPORT)
PREFILLED_SYRINGE | INTRAVENOUS | Status: AC
  Filled 2013-07-14: qty 10

## 2013-07-14 MED ORDER — 0.9 % SODIUM CHLORIDE (POUR BTL) OPTIME
TOPICAL | Status: DC | PRN
  Administered 2013-07-14: 1000 mL

## 2013-07-14 MED ORDER — DEXAMETHASONE SODIUM PHOSPHATE 10 MG/ML IJ SOLN
10.0000 mg | Freq: Every day | INTRAMUSCULAR | Status: AC
  Filled 2013-07-14: qty 1

## 2013-07-14 MED ORDER — PROPOFOL 10 MG/ML IV BOLUS
INTRAVENOUS | Status: DC | PRN
  Administered 2013-07-14: 170 mg via INTRAVENOUS

## 2013-07-14 MED ORDER — FENTANYL CITRATE 0.05 MG/ML IJ SOLN
INTRAMUSCULAR | Status: AC
  Filled 2013-07-14: qty 2

## 2013-07-14 MED ORDER — METOPROLOL SUCCINATE ER 100 MG PO TB24
100.0000 mg | ORAL_TABLET | Freq: Every day | ORAL | Status: DC
  Administered 2013-07-15 – 2013-07-16 (×2): 100 mg via ORAL
  Filled 2013-07-14 (×3): qty 1

## 2013-07-14 MED ORDER — CHLORHEXIDINE GLUCONATE 4 % EX LIQD: 60.0000 mL | Freq: Once | CUTANEOUS | Status: DC

## 2013-07-14 MED ORDER — HYDRALAZINE HCL 20 MG/ML IJ SOLN
INTRAMUSCULAR | Status: DC | PRN
  Administered 2013-07-14: 10 mg via INTRAVENOUS

## 2013-07-14 MED ORDER — DEXAMETHASONE SODIUM PHOSPHATE 10 MG/ML IJ SOLN
INTRAMUSCULAR | Status: AC
  Filled 2013-07-14: qty 1

## 2013-07-14 MED ORDER — ONDANSETRON HCL 4 MG/2ML IJ SOLN
4.0000 mg | Freq: Four times a day (QID) | INTRAMUSCULAR | Status: DC | PRN
  Administered 2013-07-14: 4 mg via INTRAVENOUS
  Filled 2013-07-14: qty 2

## 2013-07-14 MED ORDER — BUPIVACAINE HCL (PF) 0.25 % IJ SOLN
INTRAMUSCULAR | Status: AC
  Filled 2013-07-14: qty 30

## 2013-07-14 MED ORDER — DOCUSATE SODIUM 100 MG PO CAPS
100.0000 mg | ORAL_CAPSULE | Freq: Two times a day (BID) | ORAL | Status: DC
  Administered 2013-07-14 – 2013-07-16 (×4): 100 mg via ORAL

## 2013-07-14 MED ORDER — MIDAZOLAM HCL 5 MG/5ML IJ SOLN
INTRAMUSCULAR | Status: DC | PRN
  Administered 2013-07-14: 2 mg via INTRAVENOUS

## 2013-07-14 MED ORDER — BUPIVACAINE LIPOSOME 1.3 % IJ SUSP
20.0000 mL | Freq: Once | INTRAMUSCULAR | Status: DC
  Filled 2013-07-14: qty 20

## 2013-07-14 MED ORDER — ONDANSETRON HCL 4 MG/2ML IJ SOLN
INTRAMUSCULAR | Status: AC
  Filled 2013-07-14: qty 2

## 2013-07-14 MED ORDER — TRAMADOL HCL 50 MG PO TABS
50.0000 mg | ORAL_TABLET | Freq: Four times a day (QID) | ORAL | Status: DC | PRN
  Administered 2013-07-14 – 2013-07-15 (×2): 100 mg via ORAL
  Filled 2013-07-14 (×2): qty 2

## 2013-07-14 MED ORDER — TRANEXAMIC ACID 100 MG/ML IV SOLN
1000.0000 mg | INTRAVENOUS | Status: AC
  Administered 2013-07-14: 1000 mg via INTRAVENOUS
  Filled 2013-07-14: qty 10

## 2013-07-14 MED ORDER — FLEET ENEMA 7-19 GM/118ML RE ENEM: 1.0000 | ENEMA | Freq: Once | RECTAL | Status: AC | PRN

## 2013-07-14 MED ORDER — MIDAZOLAM HCL 2 MG/2ML IJ SOLN
INTRAMUSCULAR | Status: AC
  Filled 2013-07-14: qty 2

## 2013-07-14 MED ORDER — BUPIVACAINE HCL 0.25 % IJ SOLN
INTRAMUSCULAR | Status: DC | PRN
  Administered 2013-07-14: 30 mL

## 2013-07-14 MED ORDER — SUFENTANIL CITRATE 50 MCG/ML IV SOLN
INTRAVENOUS | Status: DC | PRN
  Administered 2013-07-14: 10 ug via INTRAVENOUS
  Administered 2013-07-14: 20 ug via INTRAVENOUS
  Administered 2013-07-14 (×4): 10 ug via INTRAVENOUS
  Administered 2013-07-14: 5 ug via INTRAVENOUS

## 2013-07-14 MED ORDER — METOCLOPRAMIDE HCL 5 MG/ML IJ SOLN
5.0000 mg | Freq: Three times a day (TID) | INTRAMUSCULAR | Status: DC | PRN
Start: 2013-07-14 — End: 2013-07-16
  Administered 2013-07-14: 10 mg via INTRAVENOUS
  Filled 2013-07-14: qty 2

## 2013-07-14 MED ORDER — LACTATED RINGERS IV SOLN
INTRAVENOUS | Status: DC
  Administered 2013-07-14: 12:00:00 via INTRAVENOUS
  Administered 2013-07-14: 1000 mL via INTRAVENOUS
  Administered 2013-07-14: 11:00:00 via INTRAVENOUS

## 2013-07-14 MED ORDER — HYDROMORPHONE HCL PF 1 MG/ML IJ SOLN
INTRAMUSCULAR | Status: AC
  Filled 2013-07-14: qty 1

## 2013-07-14 MED ORDER — OXYCODONE HCL 5 MG PO TABS
5.0000 mg | ORAL_TABLET | ORAL | Status: DC | PRN
  Administered 2013-07-14 – 2013-07-15 (×2): 5 mg via ORAL
  Filled 2013-07-14 (×2): qty 1

## 2013-07-14 MED ORDER — SUCCINYLCHOLINE CHLORIDE 20 MG/ML IJ SOLN
INTRAMUSCULAR | Status: DC | PRN
  Administered 2013-07-14: 100 mg via INTRAVENOUS

## 2013-07-14 MED ORDER — FENTANYL CITRATE 0.05 MG/ML IJ SOLN
25.0000 ug | INTRAMUSCULAR | Status: DC | PRN
  Administered 2013-07-14 (×2): 50 ug via INTRAVENOUS

## 2013-07-14 MED ORDER — LACTATED RINGERS IV SOLN: INTRAVENOUS | Status: DC

## 2013-07-14 MED ORDER — LACTATED RINGERS IV SOLN: INTRAVENOUS | Status: DC | PRN

## 2013-07-14 MED ORDER — SODIUM CHLORIDE 0.9 % IV SOLN: INTRAVENOUS | Status: DC

## 2013-07-14 MED ORDER — DEXAMETHASONE 4 MG PO TABS
10.0000 mg | ORAL_TABLET | Freq: Every day | ORAL | Status: AC
  Administered 2013-07-15: 10 mg via ORAL
  Filled 2013-07-14: qty 1

## 2013-07-14 MED ORDER — SODIUM CHLORIDE 0.9 % IJ SOLN
INTRAMUSCULAR | Status: DC | PRN
  Administered 2013-07-14: 12:00:00

## 2013-07-14 MED ORDER — ROCURONIUM BROMIDE 100 MG/10ML IV SOLN
INTRAVENOUS | Status: AC
  Filled 2013-07-14: qty 1

## 2013-07-14 MED ORDER — SUCCINYLCHOLINE CHLORIDE 20 MG/ML IJ SOLN
INTRAMUSCULAR | Status: AC
  Filled 2013-07-14: qty 1

## 2013-07-14 MED ORDER — KETOROLAC TROMETHAMINE 15 MG/ML IJ SOLN
7.5000 mg | Freq: Four times a day (QID) | INTRAMUSCULAR | Status: AC | PRN
  Administered 2013-07-14 – 2013-07-15 (×2): 7.5 mg via INTRAVENOUS
  Filled 2013-07-14 (×2): qty 1

## 2013-07-14 MED ORDER — CEFAZOLIN SODIUM-DEXTROSE 2-3 GM-% IV SOLR
INTRAVENOUS | Status: AC
  Filled 2013-07-14: qty 50

## 2013-07-14 MED ORDER — METOCLOPRAMIDE HCL 5 MG PO TABS
5.0000 mg | ORAL_TABLET | Freq: Three times a day (TID) | ORAL | Status: DC | PRN
  Filled 2013-07-14: qty 2

## 2013-07-14 MED ORDER — RIVAROXABAN 10 MG PO TABS
10.0000 mg | ORAL_TABLET | Freq: Every day | ORAL | Status: DC
  Administered 2013-07-15 – 2013-07-16 (×2): 10 mg via ORAL
  Filled 2013-07-14 (×3): qty 1

## 2013-07-14 MED ORDER — BISACODYL 10 MG RE SUPP: 10.0000 mg | Freq: Every day | RECTAL | Status: DC | PRN

## 2013-07-14 MED ORDER — ONDANSETRON HCL 4 MG/2ML IJ SOLN
INTRAMUSCULAR | Status: DC | PRN
  Administered 2013-07-14: 4 mg via INTRAVENOUS

## 2013-07-14 MED ORDER — MENTHOL 3 MG MT LOZG: 1.0000 | LOZENGE | OROMUCOSAL | Status: DC | PRN

## 2013-07-14 MED ORDER — METHOCARBAMOL 100 MG/ML IJ SOLN
500.0000 mg | Freq: Four times a day (QID) | INTRAVENOUS | Status: DC | PRN
  Administered 2013-07-14 (×2): 500 mg via INTRAVENOUS
  Filled 2013-07-14 (×2): qty 5

## 2013-07-14 MED ORDER — LIDOCAINE HCL (CARDIAC) 20 MG/ML IV SOLN
INTRAVENOUS | Status: DC | PRN
  Administered 2013-07-14: 50 mg via INTRAVENOUS

## 2013-07-14 MED ORDER — ONDANSETRON HCL 4 MG PO TABS
4.0000 mg | ORAL_TABLET | Freq: Four times a day (QID) | ORAL | Status: DC | PRN
  Administered 2013-07-14 – 2013-07-15 (×2): 4 mg via ORAL
  Filled 2013-07-14 (×2): qty 1

## 2013-07-14 MED ORDER — SODIUM CHLORIDE 0.9 % IR SOLN
Status: DC | PRN
  Administered 2013-07-14: 1000 mL

## 2013-07-14 MED ORDER — CEFAZOLIN SODIUM-DEXTROSE 2-3 GM-% IV SOLR
2.0000 g | Freq: Four times a day (QID) | INTRAVENOUS | Status: AC
  Administered 2013-07-14 (×2): 2 g via INTRAVENOUS
  Filled 2013-07-14 (×2): qty 50

## 2013-07-14 MED ORDER — POLYETHYLENE GLYCOL 3350 17 G PO PACK: 17.0000 g | PACK | Freq: Every day | ORAL | Status: DC | PRN

## 2013-07-14 MED ORDER — ACETAMINOPHEN 650 MG RE SUPP: 650.0000 mg | Freq: Four times a day (QID) | RECTAL | Status: DC | PRN

## 2013-07-14 MED ORDER — ACETAMINOPHEN 500 MG PO TABS
1000.0000 mg | ORAL_TABLET | Freq: Once | ORAL | Status: AC
  Administered 2013-07-14: 1000 mg via ORAL
  Filled 2013-07-14: qty 2

## 2013-07-14 MED ORDER — HYDROMORPHONE HCL PF 1 MG/ML IJ SOLN
INTRAMUSCULAR | Status: DC | PRN
  Administered 2013-07-14 (×2): 1 mg via INTRAVENOUS

## 2013-07-14 MED ORDER — MORPHINE SULFATE 10 MG/ML IJ SOLN: 1.0000 mg | INTRAMUSCULAR | Status: DC | PRN

## 2013-07-14 MED ORDER — DEXTROSE-NACL 5-0.9 % IV SOLN
INTRAVENOUS | Status: DC
  Administered 2013-07-14: 14:00:00 via INTRAVENOUS

## 2013-07-14 MED ORDER — SODIUM CHLORIDE 0.9 % IJ SOLN
INTRAMUSCULAR | Status: AC
  Filled 2013-07-14: qty 50

## 2013-07-14 SURGICAL SUPPLY — 63 items
BAG SPEC THK2 15X12 ZIP CLS (MISCELLANEOUS) ×1
BAG ZIPLOCK 12X15 (MISCELLANEOUS) ×2 IMPLANT
BANDAGE ELASTIC 6 VELCRO ST LF (GAUZE/BANDAGES/DRESSINGS) ×2 IMPLANT
BANDAGE ESMARK 6X9 LF (GAUZE/BANDAGES/DRESSINGS) ×1 IMPLANT
BLADE SAG 18X100X1.27 (BLADE) ×2 IMPLANT
BLADE SAW SGTL 11.0X1.19X90.0M (BLADE) ×2 IMPLANT
BNDG CMPR 9X6 STRL LF SNTH (GAUZE/BANDAGES/DRESSINGS) ×1
BNDG ESMARK 6X9 LF (GAUZE/BANDAGES/DRESSINGS) ×2
BOWL SMART MIX CTS (DISPOSABLE) ×2 IMPLANT
CAPT KNEE ATTUNE RP ×1 IMPLANT
CEMENT HV SMART SET (Cement) ×4 IMPLANT
CUFF TOURN SGL QUICK 34 (TOURNIQUET CUFF) ×2
CUFF TRNQT CYL 34X4X40X1 (TOURNIQUET CUFF) ×1 IMPLANT
DECANTER SPIKE VIAL GLASS SM (MISCELLANEOUS) ×2 IMPLANT
DRAPE EXTREMITY T 121X128X90 (DRAPE) ×2 IMPLANT
DRAPE POUCH INSTRU U-SHP 10X18 (DRAPES) ×2 IMPLANT
DRAPE U-SHAPE 47X51 STRL (DRAPES) ×2 IMPLANT
DRSG ADAPTIC 3X8 NADH LF (GAUZE/BANDAGES/DRESSINGS) ×2 IMPLANT
DRSG PAD ABDOMINAL 8X10 ST (GAUZE/BANDAGES/DRESSINGS) ×1 IMPLANT
DURAPREP 26ML APPLICATOR (WOUND CARE) ×2 IMPLANT
ELECT REM PT RETURN 9FT ADLT (ELECTROSURGICAL) ×2
ELECTRODE REM PT RTRN 9FT ADLT (ELECTROSURGICAL) ×1 IMPLANT
EVACUATOR 1/8 PVC DRAIN (DRAIN) ×2 IMPLANT
FACESHIELD LNG OPTICON STERILE (SAFETY) ×10 IMPLANT
GLOVE BIO SURGEON STRL SZ7.5 (GLOVE) IMPLANT
GLOVE BIO SURGEON STRL SZ8 (GLOVE) ×2 IMPLANT
GLOVE BIOGEL PI IND STRL 6.5 (GLOVE) IMPLANT
GLOVE BIOGEL PI IND STRL 7.0 (GLOVE) IMPLANT
GLOVE BIOGEL PI IND STRL 8 (GLOVE) ×2 IMPLANT
GLOVE BIOGEL PI INDICATOR 6.5 (GLOVE) ×2
GLOVE BIOGEL PI INDICATOR 7.0 (GLOVE) ×2
GLOVE BIOGEL PI INDICATOR 8 (GLOVE) ×2
GLOVE SURG SS PI 6.5 STRL IVOR (GLOVE) IMPLANT
GOWN PREVENTION PLUS LG XLONG (DISPOSABLE) ×2 IMPLANT
GOWN STRL REIN XL XLG (GOWN DISPOSABLE) ×2 IMPLANT
HANDPIECE INTERPULSE COAX TIP (DISPOSABLE) ×2
IMMOBILIZER KNEE 20 (SOFTGOODS) ×2
IMMOBILIZER KNEE 20 THIGH 36 (SOFTGOODS) ×1 IMPLANT
KIT BASIN OR (CUSTOM PROCEDURE TRAY) ×2 IMPLANT
MANIFOLD NEPTUNE II (INSTRUMENTS) ×2 IMPLANT
NDL SAFETY ECLIPSE 18X1.5 (NEEDLE) ×2 IMPLANT
NEEDLE HYPO 18GX1.5 SHARP (NEEDLE) ×4
NS IRRIG 1000ML POUR BTL (IV SOLUTION) ×2 IMPLANT
PACK TOTAL JOINT (CUSTOM PROCEDURE TRAY) ×2 IMPLANT
PAD ABD 8X10 STRL (GAUZE/BANDAGES/DRESSINGS) ×2 IMPLANT
PADDING CAST ABS 6INX4YD NS (CAST SUPPLIES) ×1
PADDING CAST ABS COTTON 6X4 NS (CAST SUPPLIES) IMPLANT
PADDING CAST COTTON 6X4 STRL (CAST SUPPLIES) ×6 IMPLANT
POSITIONER SURGICAL ARM (MISCELLANEOUS) ×2 IMPLANT
SET HNDPC FAN SPRY TIP SCT (DISPOSABLE) ×1 IMPLANT
SPONGE GAUZE 4X4 12PLY (GAUZE/BANDAGES/DRESSINGS) ×2 IMPLANT
STRIP CLOSURE SKIN 1/2X4 (GAUZE/BANDAGES/DRESSINGS) ×4 IMPLANT
SUCTION FRAZIER 12FR DISP (SUCTIONS) ×2 IMPLANT
SUT MNCRL AB 4-0 PS2 18 (SUTURE) ×2 IMPLANT
SUT VIC AB 2-0 CT1 27 (SUTURE) ×6
SUT VIC AB 2-0 CT1 TAPERPNT 27 (SUTURE) ×3 IMPLANT
SUT VLOC 180 0 24IN GS25 (SUTURE) ×2 IMPLANT
SYR 20CC LL (SYRINGE) ×2 IMPLANT
SYR 50ML LL SCALE MARK (SYRINGE) ×2 IMPLANT
TOWEL OR 17X26 10 PK STRL BLUE (TOWEL DISPOSABLE) ×4 IMPLANT
TRAY FOLEY CATH 14FRSI W/METER (CATHETERS) ×2 IMPLANT
WATER STERILE IRR 1500ML POUR (IV SOLUTION) ×2 IMPLANT
WRAP KNEE MAXI GEL POST OP (GAUZE/BANDAGES/DRESSINGS) ×2 IMPLANT

## 2013-07-14 NOTE — Op Note (Signed)
Pre-operative diagnosis- Osteoarthritis  Right knee(s)  Post-operative diagnosis- Osteoarthritis Right knee(s)  Procedure-  Right  Total Knee Arthroplasty (Attune system)  Surgeon- [name]. Harlynn Kimbell, MD  Assistant- Avel Peace, Pa-C   Anesthesia-  General EBL-* No blood loss amount entered *  Drains Hemovac  Tourniquet time-  Total Tourniquet Time Documented: Thigh (Right) - 42 minutes Total: Thigh (Right) - 42 minutes    Complications- None  Condition-PACU - hemodynamically stable.   Brief Clinical Note  Paul Pineda is a 67 y.o. year old male with end stage OA of his right knee with progressively worsening pain and dysfunction. He has constant pain, with activity and at rest and significant functional deficits with difficulties even with ADLs. He has had extensive non-op management including analgesics, injections of cortisone, and home exercise program, but remains in significant pain with significant dysfunction. He has medial and patellofemoral bone on bone changes. He presents now for right Total Knee Arthroplasty.    Procedure in detail---   The patient is brought into the operating room and positioned supine on the operating table. After successful administration of  General,   a tourniquet is placed high on the  Right thigh(s) and the lower extremity is prepped and draped in the usual sterile fashion. Time out is performed by the operating team and then the  Right lower extremity is wrapped in Esmarch, knee flexed and the tourniquet inflated to 300 mmHg.       A midline incision is made with a ten blade through the subcutaneous tissue to the level of the extensor mechanism. A fresh blade is used to make a medial parapatellar arthrotomy. Soft tissue over the proximal medial tibia is subperiosteally elevated to the joint line with a knife and into the semimembranosus bursa with a Cobb elevator. Soft tissue over the proximal lateral tibia is elevated with attention being paid to  avoiding the patellar tendon on the tibial tubercle. The patella is everted, knee flexed 90 degrees and the ACL and PCL are removed. Findings are bone on bone medial and patellofemoral with massive global osteophytes.        The drill is used to create a starting hole in the distal femur and the canal is thoroughly irrigated with sterile saline to remove the fatty contents. The 5 degree Right  valgus alignment guide is placed into the femoral canal and the distal femoral cutting block is pinned to remove 10 mm off the distal femur. Resection is made with an oscillating saw.      The tibia is subluxed forward and the menisci are removed. The extramedullary alignment guide is placed referencing proximally at the medial aspect of the tibial tubercle and distally along the second metatarsal axis and tibial crest. The block is pinned to remove 2mm off the more deficient medial  side. Resection is made with an oscillating saw. Size 7is the most appropriate size for the tibia and the proximal tibia is prepared with the modular drill and keel punch for that size.      The femoral sizing guide is placed and size 7 is most appropriate. Rotation is marked off the epicondylar axis and confirmed by creating a rectangular flexion gap at 90 degrees. The size 7 cutting block is pinned in this rotation and the anterior, posterior and chamfer cuts are made with the oscillating saw. The intercondylar block is then placed and that cut is made.      Trial size 7 tibial component, trial size 7  posterior stabilized femur and a 6  mm posterior stabilized rotating platform insert trial is placed. Full extension is achieved with excellent varus/valgus and anterior/posterior balance throughout full range of motion. The patella is everted and thickness measured to be 27  mm. Free hand resection is taken to 15 mm, a 41 template is placed, lug holes are drilled, trial patella is placed, and it tracks normally. Osteophytes are removed off the  posterior femur with the trial in place. All trials are removed and the cut bone surfaces prepared with pulsatile lavage. Cement is mixed and once ready for implantation, the size 7 tibial implant, size  7 posterior stabilized femoral component, and the size 41 patella are cemented in place and the patella is held with the clamp. The trial insert is placed and the knee held in full extension. The Exparel (20 ml mixed with 30 ml saline) and .25% Bupivicaine, are injected into the extensor mechanism, posterior capsule, medial and lateral gutters and subcutaneous tissues.  All extruded cement is removed and once the cement is hard the permanent 6 mm posterior stabilized rotating platform insert is placed into the tibial tray.      The wound is copiously irrigated with saline solution and the extensor mechanism closed over a hemovac drain with #1 PDS suture. The tourniquet is released for a total tourniquet time of 42  minutes. Flexion against gravity is 140 degrees and the patella tracks normally. Subcutaneous tissue is closed with 2.0 vicryl and subcuticular with running 4.0 Monocryl. The incision is cleaned and dried and steri-strips and a bulky sterile dressing are applied. The limb is placed into a knee immobilizer and the patient is awakened and transported to recovery in stable condition.      Please note that a surgical assistant was a medical necessity for this procedure in order to perform it in a safe and expeditious manner. Surgical assistant was necessary to retract the ligaments and vital neurovascular structures to prevent injury to them and also necessary for proper positioning of the limb to allow for anatomic placement of the prosthesis.   [name] Kimmie Berggren, MD    07/14/2013, 11:59 AM

## 2013-07-14 NOTE — Anesthesia Postprocedure Evaluation (Signed)
  Anesthesia Post-op Note  Patient: Paul Pineda  Procedure(s) Performed: Procedure(s) (LRB): RIGHT TOTAL KNEE ARTHROPLASTY (Right)  Patient Location: PACU  Anesthesia Type: General  Level of Consciousness: awake and alert   Airway and Oxygen Therapy: Patient Spontanous Breathing  Post-op Pain: mild  Post-op Assessment: Post-op Vital signs reviewed, Patient's Cardiovascular Status Stable, Respiratory Function Stable, Patent Airway and No signs of Nausea or vomiting  Last Vitals:  Filed Vitals:   07/14/13 1351  BP: 172/93  Pulse: 81  Temp: 36.8 C  Resp: 20    Post-op Vital Signs: stable   Complications: No apparent anesthesia complications

## 2013-07-14 NOTE — Progress Notes (Signed)
Utilization review completed.  

## 2013-07-14 NOTE — Anesthesia Preprocedure Evaluation (Addendum)
Anesthesia Evaluation  Patient identified by MRN, date of birth, ID band Patient awake    Reviewed: Allergy & Precautions, H&P , NPO status , Patient's Chart, lab work & pertinent test results, reviewed documented beta blocker date and time   Airway Mallampati: II TM Distance: >3 FB Neck ROM: full    Dental no notable dental hx. (+) Teeth Intact and Dental Advisory Given   Pulmonary neg pulmonary ROS, former smoker,  breath sounds clear to auscultation  Pulmonary exam normal       Cardiovascular hypertension, Pt. on home beta blockers and Pt. on medications Rhythm:regular Rate:Normal  LBBB   Neuro/Psych negative neurological ROS  negative psych ROS   GI/Hepatic negative GI ROS, Neg liver ROS,   Endo/Other  negative endocrine ROS  Renal/GU negative Renal ROS  negative genitourinary   Musculoskeletal   Abdominal   Peds  Hematology negative hematology ROS (+)   Anesthesia Other Findings   Reproductive/Obstetrics negative OB ROS                         Anesthesia Physical Anesthesia Plan  ASA: III  Anesthesia Plan: General   Post-op Pain Management:    Induction:   Airway Management Planned: Oral ETT  Additional Equipment:   Intra-op Plan:   Post-operative Plan: Extubation in OR  Informed Consent: I have reviewed the patients History and Physical, chart, labs and discussed the procedure including the risks, benefits and alternatives for the proposed anesthesia with the patient or authorized representative who has indicated his/her understanding and acceptance.   Dental Advisory Given  Plan Discussed with: CRNA and Surgeon  Anesthesia Plan Comments:       Anesthesia Quick Evaluation

## 2013-07-14 NOTE — Preoperative (Signed)
Beta Blockers   Reason not to administer Beta Blockers:Not Applicable 

## 2013-07-14 NOTE — H&P (View-Only) (Signed)
Paul Pineda  DOB: 08/14/1945 Married / Language: English / Race: White Male  Date of Admission:  07-14-2013  Chief Complaint:  Right Knee Pain  History of Present Illness The patient is a 67 year old male who comes in for a preoperative History and Physical. The patient is scheduled for a right total knee arthroplasty to be performed by Dr. [name]. [name], MD at [facility] on 07/14/2013. The patient was seen for a second opinion. The patient reports right knee symptoms including: pain, swelling, instability, giving way and stiffness which began year(s) ago without any known injury.The patient feels that the symptoms are worsening. The patient has the current diagnosis of knee osteoarthritis. Previous work-up for this problem has included knee x-rays. Past treatment for this problem has included intra-articular injection of corticosteroids (last injection was 07/07/09). Current treatment includes application of ice, restricted activity and nonsteroidal anti-inflammatory drugs (Naproxen).  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 He states the previous cortisone injections have only helped for a short period. He also has a history of psoriatic arthritis. The patient's knee has gotten progressively worse over time. He is at a stage where it is hurting him all the time. He has pain at night and during the day. It is limiting what he can and cannot do. He still drives long distance doing truck deliveries. He does not have to do any loading or unloading of the truck. he is not having any significant swelling in the knee. It does give out on him at times. He is not having any left knee pain,hip pain, low back pain, lower extremity weakness or paresthesias associated with this. He has been treated previously by Dr. [name] with injections. The first injection lasted a long time  for several months, then had another injection which did not help as much. He is at a stage now where the knee is far worse than it was when he had the injections. The patient would like to be more active, but cannot do so because of the pain. He is ready to proceed with surgery. Risks and benefits of the surgery have been discussed with the patient and they elect to proceed with surgery.  There are on active contraindications to upcoming procedure such as ongoing infection or progressive neurological disease.   Problem List/Past Medical Primary osteoarthritis of one knee (715.16) High blood pressure Skin Cancer  Allergies No Known Drug Allergies. 05/01/2013    Family History Cancer. Father. father    Social History Illicit drug use. no Living situation. live with spouse Drug/Alcohol Rehab (Previously). no Exercise. Exercises never Marital status. married Tobacco / smoke exposure. no Number of flights of stairs before winded. 2-3 Pain Contract. no Drug/Alcohol Rehab (Currently). no Tobacco use. Former smoker. former smoker; smoke(d) 2 pack(s) per day Children. 2 Current work status. working full time Alcohol use. current drinker; drinks beer and wine; 5-7 per week Post-Surgical Plans. Home Advance Directives. Living Will    Medication History Enbrel ( Subcutaneous) Specific dose unknown - Active. Metoprolol Succinate ER ( Oral) Specific dose unknown - Active. Lisinopril ( Oral) Specific dose unknown - Active. Multivitamins ( Oral) Active.    Past Surgical History Inguinal Hernia Repair. open: right   Review of Systems General:Not Present- Chills, Fever, Night Sweats, Fatigue, Weight Gain, Weight Loss and Memory Loss. Skin:Not Present- Hives, Itching, Rash, Eczema and Lesions.  HEENT:Not Present- Tinnitus, Headache, Double Vision, Visual Loss, Hearing Loss and Dentures. Respiratory:Not Present- Shortness of breath with exertion,  Shortness of breath at rest, Allergies, Coughing up blood and Chronic Cough. Cardiovascular:Not Present- Chest Pain, Racing/skipping heartbeats, Difficulty Breathing Lying Down, Murmur, Swelling and Palpitations. Gastrointestinal:Not Present- Bloody Stool, Heartburn, Abdominal Pain, Vomiting, Nausea, Constipation, Diarrhea, Difficulty Swallowing, Jaundice and Loss of appetitie. Male Genitourinary:Not Present- Urinary frequency, Blood in Urine, Weak urinary stream, Discharge, Flank Pain, Incontinence, Painful Urination, Urgency, Urinary Retention and Urinating at Night. Musculoskeletal:Present- Muscle Pain and Joint Swelling. Not Present- Muscle Weakness, Joint Pain, Back Pain, Morning Stiffness and Spasms. Neurological:Not Present- Tremor, Dizziness, Blackout spells, Paralysis, Difficulty with balance and Weakness. Psychiatric:Not Present- Insomnia.    Vitals Weight: 227 lb Height: 71 in Weight was reported by patient. Height was reported by patient. Body Surface Area: 2.27 m Body Mass Index: 31.66 kg/m Pulse: 68 (Regular) Resp.: 16 (Unlabored) BP: 138/82 (Sitting, Right Arm, Standard)     Physical Exam The physical exam findings are as follows:   General Mental Status - Alert, cooperative and good historian. General Appearance- pleasant. Not in acute distress. Orientation- Oriented X3. Build & Nutrition- Well nourished and Well developed.   Head and Neck Head- normocephalic, atraumatic . Neck Global Assessment- supple. no bruit auscultated on the right and no bruit auscultated on the left.   Eye Vision- Wears corrective lenses. Pupil- Bilateral- Regular and Round. Motion- Bilateral- EOMI.   Chest and Lung Exam Auscultation: Breath sounds:- clear at anterior chest wall and - clear at posterior chest wall. Adventitious sounds:- No Adventitious sounds.   Cardiovascular Auscultation:Rhythm- Regular rate and rhythm. Heart  Sounds- S1 WNL and S2 WNL. Murmurs & Other Heart Sounds: Murmur 1:Location- Aortic Area and Pulmonic Area. Timing- Holosystolic. Grade- III/VI. Character- Crescendo/Decrescendo.   Abdomen Palpation/Percussion:Tenderness- Abdomen is non-tender to palpation. Rigidity (guarding)- Abdomen is soft. Auscultation:Auscultation of the abdomen reveals - Bowel sounds normal.   Male Genitourinary Not done, not pertinent to present illness  Musculoskeletal On exam well developed male alert and oriented in no apparent distress. His hip shows normal range of motion with no discomfort. The left knee no effusion. Range 0 to 140. No tenderness or instability. The right knee has a lot of soft tissue hypertrophy. There is a small effusion. Range about 10 to 120 with marked crepitus on range of motion. Tenderness medial greater than lateral. No instability noted. Pulses sensation and motor intact distally.  RADIOGRAPHS: AP both knees and lateral of the right show the severe endstage arthritis of the right knee tricompartmental bone on bone with large osteophyte formation. The left knee looks normal.   Assessment & Plan Primary osteoarthritis of one knee (715.16) Impression: Right Knee  Note: Plan is for a Right Total Knee Replacement by Dr. Lequita Halt.  Plan is to go home.  The patient does not have any contraindications and will receive TXA (tranexamic acid) prior to surgery.  Signed electronically by Lauraine Rinne, III PA-C

## 2013-07-14 NOTE — Transfer of Care (Signed)
Immediate Anesthesia Transfer of Care Note  Patient: Paul Pineda  Procedure(s) Performed: Procedure(s): RIGHT TOTAL KNEE ARTHROPLASTY (Right)  Patient Location: PACU  Anesthesia Type:Spinal  Level of Consciousness: awake, alert , oriented and patient cooperative  Airway & Oxygen Therapy: Patient Spontanous Breathing and Patient connected to face mask oxygen  Post-op Assessment: Report given to PACU RN, Post -op Vital signs reviewed and stable and Patient moving all extremities X 4  Post vital signs: stable  Complications: No apparent anesthesia complications

## 2013-07-14 NOTE — Interval H&P Note (Signed)
History and Physical Interval Note:  07/14/2013 8:36 AM  Paul Pineda  has presented today for surgery, with the diagnosis of OA RIGHT KNEE   The various methods of treatment have been discussed with the patient and family. After consideration of risks, benefits and other options for treatment, the patient has consented to  Procedure(s): RIGHT TOTAL KNEE ARTHROPLASTY (Right) as a surgical intervention .  The patient's history has been reviewed, patient examined, no change in status, stable for surgery.  I have reviewed the patient's chart and labs.  Questions were answered to the patient's satisfaction.     Loanne Drilling

## 2013-07-15 ENCOUNTER — Encounter (HOSPITAL_COMMUNITY): Payer: Self-pay | Admitting: Orthopedic Surgery

## 2013-07-15 DIAGNOSIS — D62 Acute posthemorrhagic anemia: Secondary | ICD-10-CM | POA: Diagnosis not present

## 2013-07-15 LAB — CBC
HCT: 38 % — ABNORMAL LOW (ref 39.0–52.0)
MCH: 34.3 pg — ABNORMAL HIGH (ref 26.0–34.0)
MCV: 101.1 fL — ABNORMAL HIGH (ref 78.0–100.0)
Platelets: 141 10*3/uL — ABNORMAL LOW (ref 150–400)
RBC: 3.76 MIL/uL — ABNORMAL LOW (ref 4.22–5.81)
WBC: 12.5 10*3/uL — ABNORMAL HIGH (ref 4.0–10.5)

## 2013-07-15 LAB — BASIC METABOLIC PANEL
BUN: 15 mg/dL (ref 6–23)
CO2: 25 mEq/L (ref 19–32)
Calcium: 8.3 mg/dL — ABNORMAL LOW (ref 8.4–10.5)
Chloride: 101 mEq/L (ref 96–112)
Creatinine, Ser: 1.03 mg/dL (ref 0.50–1.35)
GFR calc Af Amer: 86 mL/min — ABNORMAL LOW (ref >90)
Glucose, Bld: 154 mg/dL — ABNORMAL HIGH (ref 70–99)
Potassium: 4.1 mEq/L (ref 3.5–5.1)

## 2013-07-15 MED ORDER — HYDROMORPHONE HCL 2 MG PO TABS
2.0000 mg | ORAL_TABLET | ORAL | Status: DC | PRN
  Administered 2013-07-15 (×2): 4 mg via ORAL
  Administered 2013-07-15: 2 mg via ORAL
  Administered 2013-07-15 – 2013-07-16 (×3): 4 mg via ORAL
  Filled 2013-07-15 (×3): qty 2
  Filled 2013-07-15: qty 1
  Filled 2013-07-15 (×2): qty 2

## 2013-07-15 NOTE — Evaluation (Signed)
Physical Therapy Evaluation Patient Details Name: Paul Pineda MRN: [id number] DOB: 1946-06-24 Today's Date: 07/15/2013 Time: 1000-1024 PT Time Calculation (min): 24 min  PT Assessment / Plan / Recommendation History of Present Illness  s/p Right TKA  Clinical Impression  Pt will benefit from PT to address deficits below    PT Assessment  Patient needs continued PT services    Follow Up Recommendations  Home health PT    Does the patient have the potential to tolerate intense rehabilitation      Barriers to Discharge        Equipment Recommendations       Recommendations for Other Services     Frequency 7X/week    Precautions / Restrictions Precautions Precautions: Knee;Fall Required Braces or Orthoses: Knee Immobilizer - Right Knee Immobilizer - Right: Discontinue once straight leg raise with < 10 degree lag Restrictions Weight Bearing Restrictions: No Other Position/Activity Restrictions: WBAT   Pertinent Vitals/Pain       Mobility  Bed Mobility Bed Mobility: Supine to Sit Supine to Sit: 5: Supervision Details for Bed Mobility Assistance: for safety Transfers Transfers: Sit to Stand;Stand to Sit Sit to Stand: 4: Min guard Stand to Sit: 4: Min guard Details for Transfer Assistance: cues for hand placement and  RLE position Ambulation/Gait Ambulation/Gait Assistance: 4: Min guard Ambulation Distance (Feet): 100 Feet Assistive device: Rolling walker Ambulation/Gait Assistance Details: cues for sequence and use of UEs for pain control; Gait Pattern: Step-to pattern;Antalgic    Exercises Total Joint Exercises Ankle Circles/Pumps: AROM;10 reps;Both Quad Sets: AROM;10 reps;Both   PT Diagnosis: Difficulty walking  PT Problem List: Decreased strength;Decreased range of motion;Decreased activity tolerance;Decreased mobility;Decreased knowledge of use of DME PT Treatment Interventions: DME instruction;Gait training;Functional mobility training;Therapeutic  activities;Therapeutic exercise;Patient/family education     PT Goals(Current goals can be found in the care plan section) Acute Rehab PT Goals Patient Stated Goal: none stated PT Goal Formulation: With patient Time For Goal Achievement: 07/17/13 Potential to Achieve Goals: Good  Visit Information  Last PT Received On: 07/15/13 Assistance Needed: +1 History of Present Illness: s/p Right TKA       Prior Functioning  Home Living Family/patient expects to be discharged to:: Private residence Living Arrangements: Spouse/significant other Type of Home: House Home Access: Stairs to enter Secretary/administrator of Steps: 2 Home Layout: One level Home Equipment: Grab bars - tub/shower;Walker - 2 wheels;Cane - single point Additional Comments: planning to get toilet riser Prior Function Level of Independence: Independent Communication Communication: No difficulties    Cognition  Cognition Arousal/Alertness: Awake/alert Behavior During Therapy: WFL for tasks assessed/performed Overall Cognitive Status: Within Functional Limits for tasks assessed    Extremity/Trunk Assessment Upper Extremity Assessment Upper Extremity Assessment: Overall WFL for tasks assessed Lower Extremity Assessment Lower Extremity Assessment: RLE deficits/detail RLE Deficits / Details: ankle WFL;  able to do  SLR  with much difficultya dn 15* quad lag   Balance    End of Session PT - End of Session Equipment Utilized During Treatment: Right knee immobilizer Activity Tolerance: Patient tolerated treatment well Patient left: in bed;with call bell/phone within reach Nurse Communication: Mobility status CPM Right Knee CPM Right Knee: Off  GP     [facility] 07/15/2013, 10:37 AM

## 2013-07-15 NOTE — Progress Notes (Signed)
   Subjective: 1 Day Post-Op Procedure(s) (LRB): RIGHT TOTAL KNEE ARTHROPLASTY (Right) Patient reports pain as mild and manageable as per patient. Patient seen in rounds with Dr. Lequita Halt. Patient is well, and has had no acute complaints or problems We will start therapy today.  Plan is to go Home after hospital stay.  Objective: Vital signs in last 24 hours: Temp:  [97.4 F (36.3 C)-98.6 F (37 C)] 98.4 F (36.9 C) (12/16 0551) Pulse Rate:  [70-88] 75 (12/16 0551) Resp:  [13-20] 16 (12/16 0736) BP: (101-172)/(64-93) 133/81 mmHg (12/16 0551) SpO2:  [93 %-100 %] 97 % (12/16 0736) Weight:  [100.154 kg (220 lb 12.8 oz)] 100.154 kg (220 lb 12.8 oz) (12/15 1351)  Intake/Output from previous day:  Intake/Output Summary (Last 24 hours) at 07/15/13 0850 Last data filed at 07/15/13 0850  Gross per 24 hour  Intake 5406.66 ml  Output   2135 ml  Net 3271.66 ml    Intake/Output this shift: Total I/O In: 383.3 [I.V.:383.3] Out: -   Labs:  Recent Labs  07/15/13 0430  HGB 12.9*    Recent Labs  07/15/13 0430  WBC 12.5*  RBC 3.76*  HCT 38.0*  PLT 141*    Recent Labs  07/15/13 0430  NA 135  K 4.1  CL 101  CO2 25  BUN 15  CREATININE 1.03  GLUCOSE 154*  CALCIUM 8.3*   No results found for this basename: LABPT, INR,  in the last 72 hours  EXAM General - Patient is Alert, Appropriate and Oriented Extremity - Neurovascular intact Sensation intact distally Dorsiflexion/Plantar flexion intact Dressing - dressing C/D/I Motor Function - intact, moving foot and toes well on exam.  Hemovac pulled without difficulty.  Past Medical History  Diagnosis Date  . Arthritis   . Hyperlipidemia   . Hypertension   . Pneumonia     hx  . Heart murmur   . Cancer     chest lump ,scrotum  . Left bundle branch block     Assessment/Plan: 1 Day Post-Op Procedure(s) (LRB): RIGHT TOTAL KNEE ARTHROPLASTY (Right) Principal Problem:   OA (osteoarthritis) of knee Active  Problems:   Postoperative anemia due to acute blood loss  Estimated body mass index is 30.81 kg/(m^2) as calculated from the following:   Height as of this encounter: 5\' 11"  (1.803 m).   Weight as of this encounter: 100.154 kg (220 lb 12.8 oz). Up with therapy Plan for discharge tomorrow Discharge home with home health  DVT Prophylaxis - Xarelto Weight-Bearing as tolerated to right leg D/C O2 and Pulse OX and try on Room Air  Vinnie Bobst, Marlowe Sax 07/15/2013, 8:50 AM

## 2013-07-15 NOTE — Progress Notes (Signed)
07/15/13 1300  PT Visit Information  Last PT Received On 07/15/13  Assistance Needed +1  History of Present Illness s/p Right TKA  PT Time Calculation  PT Start Time 1241  PT Stop Time 1257  PT Time Calculation (min) 16 min  Subjective Data  Patient Stated Goal none stated  Precautions  Precautions Knee;Fall  Required Braces or Orthoses Knee Immobilizer - Right  Knee Immobilizer - Right Discontinue once straight leg raise with < 10 degree lag  Restrictions  Other Position/Activity Restrictions WBAT  Cognition  Arousal/Alertness Awake/alert  Behavior During Therapy WFL for tasks assessed/performed  Overall Cognitive Status Within Functional Limits for tasks assessed  Total Joint Exercises  Ankle Circles/Pumps AROM;10 reps;Both  Quad Sets AROM;10 reps;Both  Short Arc Quad AROM;Strengthening;Right;10 reps  Heel Slides AROM;AAROM;Right;10 reps  Hip ABduction/ADduction AROM;Right;10 reps;AAROM  Straight Leg Raises AROM;AAROM;Right;10 reps  Goniometric ROM 90*  PT - End of Session  Activity Tolerance Patient tolerated treatment well  Patient left in bed;with call bell/phone within reach  Nurse Communication Mobility status  PT - Assessment/Plan  PT Plan Current plan remains appropriate  PT Frequency 7X/week  Follow Up Recommendations Home health PT  PT equipment None recommended by PT  PT Goal Progression  Progress towards PT goals Progressing toward goals  Acute Rehab PT Goals  Time For Goal Achievement 07/17/13  Potential to Achieve Goals Good  PT General Charges  $$ ACUTE PT VISIT 1 Procedure  PT Treatments  $Therapeutic Exercise 8-22 mins

## 2013-07-15 NOTE — Evaluation (Signed)
Occupational Therapy Evaluation Patient Details Name: Paul Pineda MRN: [id number] DOB: November 06, 1945 Today's Date: 07/15/2013 Time: 8295-6213 OT Time Calculation (min): 25 min  OT Assessment / Plan / Recommendation History of present illness s/p Right TKA   Clinical Impression   Pt was admitted for R TKA.  All education was completed.  No further OT is needed at this time    OT Assessment  Patient does not need any further OT services    Follow Up Recommendations  No OT follow up    Barriers to Discharge      Equipment Recommendations   (3:1 vs toilet riser--wants to know if insurance will cover/ if not his cost)    Recommendations for Other Services    Frequency       Precautions / Restrictions Precautions Precautions: Knee;Fall Required Braces or Orthoses: Knee Immobilizer - Right Knee Immobilizer - Right: Discontinue once straight leg raise with < 10 degree lag Restrictions Weight Bearing Restrictions: No Other Position/Activity Restrictions: WBAT   Pertinent Vitals/Pain R knee 8/10; repositioned with ice and alerted RN    ADL  Grooming: Teeth care;Supervision/safety Where Assessed - Grooming: Supported standing Upper Body Bathing: Set up Where Assessed - Upper Body Bathing: Unsupported sitting Lower Body Bathing: Minimal assistance Where Assessed - Lower Body Bathing: Supported sit to stand Upper Body Dressing: Set up Where Assessed - Upper Body Dressing: Unsupported sitting Lower Body Dressing: Minimal assistance Where Assessed - Lower Body Dressing: Supported sit to Pharmacist, hospital: Min guard (simulated:  bed) Toilet Transfer Method: Sit to stand Toileting - Architect and Hygiene: Min guard Where Assessed - Toileting Clothing Manipulation and Hygiene: Sit to stand from 3-in-1 or toilet (simulated) Equipment Used: Rolling walker Transfers/Ambulation Related to ADLs: ambulated to bathroom.  Pt with increased pain, so demonstrated but did not  practice shower stall.  Pt verbalizes understanding ADL Comments: Wife will help with sock/shoe as needed.  reviewed sequence for dressing    OT Diagnosis:    OT Problem List:   OT Treatment Interventions:     OT Goals(Current goals can be found in the care plan section) Acute Rehab OT Goals Patient Stated Goal: none stated  Visit Information  Last OT Received On: 07/15/13 Assistance Needed: +1 History of Present Illness: s/p Right TKA       Prior Functioning     Home Living Family/patient expects to be discharged to:: Private residence Living Arrangements: Spouse/significant other Type of Home: House Home Access: Stairs to enter [employer] of Steps: 2 Home Layout: One level Home Equipment: Grab bars - tub/shower;Walker - 2 wheels;Cane - single point Additional Comments: pt has shower stall with grab bars.   Prior Function Level of Independence: Independent Communication Communication: No difficulties         Vision/Perception     Cognition  Cognition Arousal/Alertness: Awake/alert Behavior During Therapy: WFL for tasks assessed/performed Overall Cognitive Status: Within Functional Limits for tasks assessed    Extremity/Trunk Assessment Upper Extremity Assessment Upper Extremity Assessment: Overall WFL for tasks assessed     Mobility Bed Mobility Bed Mobility: Supine to Sit Supine to Sit: 5: Supervision Details for Bed Mobility Assistance: for safety Transfers Sit to Stand: 4: Min guard Stand to Sit: 4: Min guard Details for Transfer Assistance: cues for hand placement     Exercise    Balance     End of Session OT - End of Session Activity Tolerance: Patient tolerated treatment well Patient left: in bed;with call bell/phone within  reach  GO     Paul Pineda 07/15/2013, 11:44 AM Marica Otter, OTR/L [phone] 07/15/2013

## 2013-07-16 LAB — CBC
HCT: 33.3 % — ABNORMAL LOW (ref 39.0–52.0)
MCH: 33.7 pg (ref 26.0–34.0)
MCHC: 33.3 g/dL (ref 30.0–36.0)
MCV: 101.2 fL — ABNORMAL HIGH (ref 78.0–100.0)
Platelets: 120 10*3/uL — ABNORMAL LOW (ref 150–400)
RDW: 12.7 % (ref 11.5–15.5)
WBC: 14 10*3/uL — ABNORMAL HIGH (ref 4.0–10.5)

## 2013-07-16 LAB — BASIC METABOLIC PANEL
BUN: 17 mg/dL (ref 6–23)
Calcium: 8.3 mg/dL — ABNORMAL LOW (ref 8.4–10.5)
Creatinine, Ser: 1 mg/dL (ref 0.50–1.35)
GFR calc Af Amer: 89 mL/min — ABNORMAL LOW (ref >90)
GFR calc non Af Amer: 77 mL/min — ABNORMAL LOW (ref >90)
Glucose, Bld: 116 mg/dL — ABNORMAL HIGH (ref 70–99)
Potassium: 3.8 mEq/L (ref 3.5–5.1)

## 2013-07-16 MED ORDER — METHOCARBAMOL 500 MG PO TABS: 500.0000 mg | ORAL_TABLET | Freq: Four times a day (QID) | ORAL | Status: DC | PRN

## 2013-07-16 MED ORDER — RIVAROXABAN 10 MG PO TABS: 10.0000 mg | ORAL_TABLET | Freq: Every day | ORAL | Status: DC

## 2013-07-16 MED ORDER — ONDANSETRON HCL 4 MG PO TABS: 4.0000 mg | ORAL_TABLET | Freq: Four times a day (QID) | ORAL | Status: DC | PRN

## 2013-07-16 MED ORDER — HYDROMORPHONE HCL 2 MG PO TABS: 2.0000 mg | ORAL_TABLET | ORAL | Status: DC | PRN

## 2013-07-16 MED ORDER — TRAMADOL HCL 50 MG PO TABS: 50.0000 mg | ORAL_TABLET | Freq: Four times a day (QID) | ORAL | Status: DC | PRN

## 2013-07-16 NOTE — Care Management Note (Signed)
    Page 1 of 2   07/16/2013     6:00:08 PM   CARE MANAGEMENT NOTE 07/16/2013  Patient:  Paul Pineda, Paul Pineda   Account Number:  [id number]  Date Initiated:  07/16/2013  Documentation initiated by:  Colleen Can  Subjective/Objective Assessment:   dx rt totall knee replacemnt     Action/Plan:   cm spoke with patient. Plans are for pt to return to his home in [facility] where spouse will be caregiver. He already has RW and cane.   Anticipated DC Date:  07/16/2013   Anticipated DC Plan:  HOME W HOME HEALTH SERVICES      DC Planning Services  CM consult      [facility]  HOME HEALTH  DURABLE MEDICAL EQUIPMENT   Choice offered to / List presented to:  C-1 Patient   DME arranged  3-N-1      DME agency  [facility]     HH arranged  HH-2 PT      [facility] agency  [facility]   Status of service:  Completed, signed off Medicare Important Message given?  NA - LOS <3 / Initial given by admissions (If response is "NO", the following Medicare IM given date fields will be blank) Date Medicare IM given:   Date Additional Medicare IM given:    Discharge Disposition:  HOME W HOME HEALTH SERVICES  Per UR Regulation:    If discussed at Long Length of Stay Meetings, dates discussed:    Comments:

## 2013-07-16 NOTE — Discharge Summary (Signed)
Physician Discharge Summary   Patient ID: Paul Pineda MRN: [id number] DOB/AGE: 65/65/1947 65 y.o.  Admit date: 07/14/2013 Discharge date: 07/16/2013  Primary Diagnosis:  Osteoarthritis Right knee(s)  Admission Diagnoses:  Past Medical History  Diagnosis Date  . Arthritis   . Hyperlipidemia   . Hypertension   . Pneumonia     hx  . Heart murmur   . Cancer     chest lump ,scrotum  . Left bundle branch block    Discharge Diagnoses:   Principal Problem:   OA (osteoarthritis) of knee Active Problems:   Postoperative anemia due to acute blood loss  Estimated body mass index is 30.81 kg/(m^2) as calculated from the following:   Height as of this encounter: 5\' 11"  (1.803 m).   Weight as of this encounter: 100.154 kg (220 lb 12.8 oz).  Procedure:  Procedure(s) (LRB): RIGHT TOTAL KNEE ARTHROPLASTY (Right)   Consults: None  HPI: Paul Pineda is a 67 y.o. year old male with end stage OA of his right knee with progressively worsening pain and dysfunction. He has constant pain, with activity and at rest and significant functional deficits with difficulties even with ADLs. He has had extensive non-op management including analgesics, injections of cortisone, and home exercise program, but remains in significant pain with significant dysfunction. He has medial and patellofemoral bone on bone changes. He presents now for right Total Knee Arthroplasty.   Laboratory Data: Admission on 07/14/2013, Discharged on 07/16/2013  Component Date Value Range Status  . ABO/RH(D) 07/14/2013 O NEG   Final  . Antibody Screen 07/14/2013 NEG   Final  . Sample Expiration 07/14/2013 07/17/2013   Final  . ABO/RH(D) 07/14/2013 O NEG   Final  . WBC 07/15/2013 12.5* 4.0 - 10.5 K/uL Final  . RBC 07/15/2013 3.76* 4.22 - 5.81 MIL/uL Final  . Hemoglobin 07/15/2013 12.9* 13.0 - 17.0 g/dL Final  . HCT 40/98/1191 38.0* 39.0 - 52.0 % Final  . MCV 07/15/2013 101.1* 78.0 - 100.0 fL Final  . MCH 07/15/2013  34.3* 26.0 - 34.0 pg Final  . MCHC 07/15/2013 33.9  30.0 - 36.0 g/dL Final  . RDW 47/82/9562 12.6  11.5 - 15.5 % Final  . Platelets 07/15/2013 141* 150 - 400 K/uL Final  . Sodium 07/15/2013 135  135 - 145 mEq/L Final  . Potassium 07/15/2013 4.1  3.5 - 5.1 mEq/L Final  . Chloride 07/15/2013 101  96 - 112 mEq/L Final  . CO2 07/15/2013 25  19 - 32 mEq/L Final  . Glucose, Bld 07/15/2013 154* 70 - 99 mg/dL Final  . BUN 13/02/6577 15  6 - 23 mg/dL Final  . Creatinine, Ser 07/15/2013 1.03  0.50 - 1.35 mg/dL Final  . Calcium 46/96/2952 8.3* 8.4 - 10.5 mg/dL Final  . GFR calc non Af Amer 07/15/2013 74* >90 mL/min Final  . GFR calc Af Amer 07/15/2013 86* >90 mL/min Final   Comment: (NOTE)                          The eGFR has been calculated using the CKD EPI equation.                          This calculation has not been validated in all clinical situations.                          eGFR's persistently <90  mL/min signify possible Chronic Kidney                          Disease.  . WBC 07/16/2013 14.0* 4.0 - 10.5 K/uL Final  . RBC 07/16/2013 3.29* 4.22 - 5.81 MIL/uL Final  . Hemoglobin 07/16/2013 11.1* 13.0 - 17.0 g/dL Final  . HCT 16/05/9603 33.3* 39.0 - 52.0 % Final  . MCV 07/16/2013 101.2* 78.0 - 100.0 fL Final  . MCH 07/16/2013 33.7  26.0 - 34.0 pg Final  . MCHC 07/16/2013 33.3  30.0 - 36.0 g/dL Final  . RDW 54/03/8118 12.7  11.5 - 15.5 % Final  . Platelets 07/16/2013 120* 150 - 400 K/uL Final  . Sodium 07/16/2013 139  135 - 145 mEq/L Final  . Potassium 07/16/2013 3.8  3.5 - 5.1 mEq/L Final  . Chloride 07/16/2013 103  96 - 112 mEq/L Final  . CO2 07/16/2013 26  19 - 32 mEq/L Final  . Glucose, Bld 07/16/2013 116* 70 - 99 mg/dL Final  . BUN 14/78/2956 17  6 - 23 mg/dL Final  . Creatinine, Ser 07/16/2013 1.00  0.50 - 1.35 mg/dL Final  . Calcium 21/30/8657 8.3* 8.4 - 10.5 mg/dL Final  . GFR calc non Af Amer 07/16/2013 77* >90 mL/min Final  . GFR calc Af Amer 07/16/2013 89* >90 mL/min Final    Comment: (NOTE)                          The eGFR has been calculated using the CKD EPI equation.                          This calculation has not been validated in all clinical situations.                          eGFR's persistently <90 mL/min signify possible Chronic Kidney                          Disease.  Hospital Outpatient Visit on 07/04/2013  Component Date Value Range Status  . MRSA, PCR 07/04/2013 NEGATIVE  NEGATIVE Final  . Staphylococcus aureus 07/04/2013 POSITIVE* NEGATIVE Final   Comment:                                 The Xpert SA Assay (FDA                          approved for NASAL specimens                          in patients over 65 years of age),                          is one component of                          a comprehensive surveillance                          program.  Test performance has  been validated by [facility] for patients greater                          than or equal to 66 year old.                          It is not intended                          to diagnose infection nor to                          guide or monitor treatment.  Marland Kitchen aPTT 07/04/2013 27  24 - 37 seconds Final  . WBC 07/04/2013 8.8  4.0 - 10.5 K/uL Final  . RBC 07/04/2013 5.02  4.22 - 5.81 MIL/uL Final  . Hemoglobin 07/04/2013 17.4* 13.0 - 17.0 g/dL Final  . HCT 72/53/6644 50.1  39.0 - 52.0 % Final  . MCV 07/04/2013 99.8  78.0 - 100.0 fL Final  . MCH 07/04/2013 34.7* 26.0 - 34.0 pg Final  . MCHC 07/04/2013 34.7  30.0 - 36.0 g/dL Final  . RDW 03/47/4259 12.5  11.5 - 15.5 % Final  . Platelets 07/04/2013 140* 150 - 400 K/uL Final  . Sodium 07/04/2013 141  135 - 145 mEq/L Final  . Potassium 07/04/2013 4.0  3.5 - 5.1 mEq/L Final  . Chloride 07/04/2013 103  96 - 112 mEq/L Final  . CO2 07/04/2013 25  19 - 32 mEq/L Final  . Glucose, Bld 07/04/2013 93  70 - 99 mg/dL Final  . BUN 56/38/7564 20  6 - 23 mg/dL Final  . Creatinine,  Ser 07/04/2013 1.18  0.50 - 1.35 mg/dL Final  . Calcium 33/29/5188 10.1  8.4 - 10.5 mg/dL Final  . Total Protein 07/04/2013 7.5  6.0 - 8.3 g/dL Final  . Albumin 41/66/0630 4.2  3.5 - 5.2 g/dL Final  . AST 16/07/930 78* 0 - 37 U/L Final  . ALT 07/04/2013 72* 0 - 53 U/L Final  . Alkaline Phosphatase 07/04/2013 64  39 - 117 U/L Final  . Total Bilirubin 07/04/2013 0.8  0.3 - 1.2 mg/dL Final  . GFR calc non Af Amer 07/04/2013 63* >90 mL/min Final  . GFR calc Af Amer 07/04/2013 73* >90 mL/min Final   Comment: (NOTE)                          The eGFR has been calculated using the CKD EPI equation.                          This calculation has not been validated in all clinical situations.                          eGFR's persistently <90 mL/min signify possible Chronic Kidney                          Disease.  Marland Kitchen Prothrombin Time 07/04/2013 12.6  11.6 - 15.2 seconds Final  . INR 07/04/2013 0.96  0.00 - 1.49 Final  . Color, Urine  07/04/2013 ORANGE* YELLOW Final   BIOCHEMICALS MAY BE AFFECTED BY COLOR  . APPearance 07/04/2013 CLEAR  CLEAR Final  . Specific Gravity, Urine 07/04/2013 1.028  1.005 - 1.030 Final  . pH 07/04/2013 5.5  5.0 - 8.0 Final  . Glucose, UA 07/04/2013 NEGATIVE  NEGATIVE mg/dL Final  . Hgb urine dipstick 07/04/2013 NEGATIVE  NEGATIVE Final  . Bilirubin Urine 07/04/2013 SMALL* NEGATIVE Final  . Ketones, ur 07/04/2013 15* NEGATIVE mg/dL Final  . Protein, ur 16/05/9603 30* NEGATIVE mg/dL Final  . Urobilinogen, UA 07/04/2013 1.0  0.0 - 1.0 mg/dL Final  . Nitrite 54/03/8118 NEGATIVE  NEGATIVE Final  . Leukocytes, UA 07/04/2013 SMALL* NEGATIVE Final  . WBC, UA 07/04/2013 0-2  <3 WBC/hpf Final  . Urine-Other 07/04/2013 MUCOUS PRESENT   Final     X-Rays:No results found.  EKG: Orders placed during the hospital encounter of 04/29/13  . EKG     Hospital Course: Paul Pineda is a 67 y.o. who was admitted to [facility]. They were brought to the operating room on  07/14/2013 and underwent Procedure(s): RIGHT TOTAL KNEE ARTHROPLASTY.  Patient tolerated the procedure well and was later transferred to the recovery room and then to the orthopaedic floor for postoperative care.  They were given PO and IV analgesics for pain control following their surgery.  They were given 24 hours of postoperative antibiotics of  Anti-infectives   Start     Dose/Rate Route Frequency Ordered Stop   07/14/13 1700  ceFAZolin (ANCEF) IVPB 2 g/50 mL premix     2 g 100 mL/hr over 30 Minutes Intravenous Every 6 hours 07/14/13 1351 07/14/13 2310   07/14/13 0800  ceFAZolin (ANCEF) IVPB 2 g/50 mL premix     2 g 100 mL/hr over 30 Minutes Intravenous On call to O.R. 07/14/13 1478 07/14/13 1055     and started on DVT prophylaxis in the form of Xarelto.   PT and OT were ordered for total joint protocol.  Discharge planning consulted to help with postop disposition and equipment needs.  Patient had a decent night on the evening of surgery.  They started to get up OOB with therapy on day one. Hemovac drain was pulled without difficulty.  Continued to work with therapy into day two.  Dressing was changed on day two and the incision was healing well.  Patient was seen in rounds and was ready to go home later that same day.   Discharge Medications: Prior to Admission medications   Medication Sig Start Date End Date Taking? Authorizing Provider  etanercept (ENBREL) 50 MG/ML injection Inject 50 mg into the skin 2 (two) times a week. Injects on Sundays and Wednesdays.   Yes Historical Provider, MD  lisinopril (PRINIVIL,ZESTRIL) 20 MG tablet Take 20 mg by mouth daily with breakfast.    Yes Historical Provider, MD  metoprolol succinate (TOPROL-XL) 100 MG 24 hr tablet Take 100 mg by mouth daily with breakfast. Take with or immediately following a meal.   Yes Historical Provider, MD  Multiple Vitamins-Minerals (MULTIVITAMIN WITH MINERALS) tablet Take 1 tablet by mouth daily.   Yes Historical Provider,  MD  naproxen sodium (ANAPROX) 220 MG tablet Take 440 mg by mouth daily.    Yes Historical Provider, MD  HYDROmorphone (DILAUDID) 2 MG tablet Take 1-2 tablets (2-4 mg total) by mouth every 3 (three) hours as needed for severe pain. 07/16/13   Kianah Harries, PA-C  methocarbamol (ROBAXIN) 500 MG tablet Take 1 tablet (500 mg total) by  mouth every 6 (six) hours as needed for muscle spasms. 07/16/13   Quran Vasco, PA-C  ondansetron (ZOFRAN) 4 MG tablet Take 1 tablet (4 mg total) by mouth every 6 (six) hours as needed for nausea. 07/16/13   Novak Stgermaine Julien Girt, PA-C  rivaroxaban (XARELTO) 10 MG TABS tablet Take 1 tablet (10 mg total) by mouth daily with breakfast. Take Xarelto for two and a half more weeks, then discontinue Xarelto. Once the patient has completed the blood thinner regimen, then take a Baby 81 mg Aspirin daily for four more weeks. 07/16/13   Timisha Mondry, PA-C  traMADol (ULTRAM) 50 MG tablet Take 1-2 tablets (50-100 mg total) by mouth every 6 (six) hours as needed (mild pain). 07/16/13   Selda Jalbert Julien Girt, PA-C   Discharge home with home health  Diet - Cardiac diet  Follow up - in 2 weeks  Activity - WBAT  Disposition - Home  Condition Upon Discharge - Good  D/C Meds - See DC Summary  DVT Prophylaxis - Xarelto       Discharge Orders   Future Orders Complete By Expires   Call MD / Call 911  As directed    Comments:     If you experience chest pain or shortness of breath, CALL 911 and be transported to the hospital emergency room.  If you develope a fever above 101 F, pus (white drainage) or increased drainage or redness at the wound, or calf pain, call your surgeon's office.   Change dressing  As directed    Comments:     Change dressing daily with sterile 4 x 4 inch gauze dressing and apply TED hose. Do not submerge the incision under water.   Constipation Prevention  As directed    Comments:     Drink plenty of fluids.  Prune juice may be helpful.  You  may use a stool softener, such as Colace (over the counter) 100 mg twice a day.  Use MiraLax (over the counter) for constipation as needed.   Diet - low sodium heart healthy  As directed    Discharge instructions  As directed    Comments:     Pick up stool softner and laxative for home. Do not submerge incision under water. May shower. Continue to use ice for pain and swelling from surgery.  Take Xarelto for two and a half more weeks, then discontinue Xarelto. Once the patient has completed the blood thinner regimen, then take a Baby 81 mg Aspirin daily for four more weeks.   Do not put a pillow under the knee. Place it under the heel.  As directed    Do not sit on low chairs, stoools or toilet seats, as it may be difficult to get up from low surfaces  As directed    Driving restrictions  As directed    Comments:     No driving until released by the physician.   Increase activity slowly as tolerated  As directed    Lifting restrictions  As directed    Comments:     No lifting until released by the physician.   Patient may shower  As directed    Comments:     You may shower without a dressing once there is no drainage.  Do not wash over the wound.  If drainage remains, do not shower until drainage stops.   TED hose  As directed    Comments:     Use stockings (TED hose) for 3 weeks on both  leg(s).  You may remove them at night for sleeping.   Weight bearing as tolerated  As directed    Questions:     Laterality:     Extremity:         Medication List    STOP taking these medications       etanercept 50 MG/ML injection  Commonly known as:  ENBREL     multivitamin with minerals tablet     naproxen sodium 220 MG tablet  Commonly known as:  ANAPROX      TAKE these medications       HYDROmorphone 2 MG tablet  Commonly known as:  DILAUDID  Take 1-2 tablets (2-4 mg total) by mouth every 3 (three) hours as needed for severe pain.     lisinopril 20 MG tablet  Commonly known  as:  PRINIVIL,ZESTRIL  Take 20 mg by mouth daily with breakfast.     methocarbamol 500 MG tablet  Commonly known as:  ROBAXIN  Take 1 tablet (500 mg total) by mouth every 6 (six) hours as needed for muscle spasms.     metoprolol succinate 100 MG 24 hr tablet  Commonly known as:  TOPROL-XL  Take 100 mg by mouth daily with breakfast. Take with or immediately following a meal.     ondansetron 4 MG tablet  Commonly known as:  ZOFRAN  Take 1 tablet (4 mg total) by mouth every 6 (six) hours as needed for nausea.     rivaroxaban 10 MG Tabs tablet  Commonly known as:  XARELTO  - Take 1 tablet (10 mg total) by mouth daily with breakfast. Take Xarelto for two and a half more weeks, then discontinue Xarelto.  - Once the patient has completed the blood thinner regimen, then take a Baby 81 mg Aspirin daily for four more weeks.     traMADol 50 MG tablet  Commonly known as:  ULTRAM  Take 1-2 tablets (50-100 mg total) by mouth every 6 (six) hours as needed (mild pain).       Follow-up Information   Follow up with Loanne Drilling, MD. Schedule an appointment as soon as possible for a visit on 2020/07/2112.   Specialty:  Orthopedic Surgery   Contact information:   [address] [place] [address] [phone]       Signed: Patrica Duel 07/17/2013, 7:46 AM

## 2013-07-16 NOTE — Progress Notes (Addendum)
   Subjective: 2 Days Post-Op Procedure(s) (LRB): RIGHT TOTAL KNEE ARTHROPLASTY (Right) Patient reports pain as mild.   Patient seen in rounds for Dr. Lequita Halt. Patient is well, and has had no acute complaints or problems Patient is ready to go home   Intake/Output from previous day:  Intake/Output Summary (Last 24 hours) at 07/17/13 0745 Last data filed at 07/16/13 0900  Gross per 24 hour  Intake    240 ml  Output    300 ml  Net    -60 ml    Intake/Output this shift: Total I/O In: 1176.7 [P.O.:720; I.V.:456.7] Out: 900 [Urine:900]  Labs:  Recent Labs  07/15/13 0430 07/16/13 0415  HGB 12.9* 11.1*    Recent Labs  07/15/13 0430 07/16/13 0415  WBC 12.5* 14.0*  RBC 3.76* 3.29*  HCT 38.0* 33.3*  PLT 141* 120*    Recent Labs  07/15/13 0430 07/16/13 0415  NA 135 139  K 4.1 3.8  CL 101 103  CO2 25 26  BUN 15 17  CREATININE 1.03 1.00  GLUCOSE 154* 116*  CALCIUM 8.3* 8.3*   No results found for this basename: LABPT, INR,  in the last 72 hours  EXAM: General - Patient is Alert, Appropriate and Oriented Extremity - Neurovascular intact Sensation intact distally Incision - clean, dry, no drainage, healing Motor Function - intact, moving foot and toes well on exam.   Assessment/Plan: 2 Days Post-Op Procedure(s) (LRB): RIGHT TOTAL KNEE ARTHROPLASTY (Right) Procedure(s) (LRB): RIGHT TOTAL KNEE ARTHROPLASTY (Right) Past Medical History  Diagnosis Date  . Arthritis   . Hyperlipidemia   . Hypertension   . Pneumonia     hx  . Heart murmur   . Cancer     chest lump ,scrotum  . Left bundle branch block    Principal Problem:   OA (osteoarthritis) of knee Active Problems:   Postoperative anemia due to acute blood loss  Estimated body mass index is 30.81 kg/(m^2) as calculated from the following:   Height as of this encounter: 5\' 11"  (1.803 m).   Weight as of this encounter: 100.154 kg (220 lb 12.8 oz). Up with therapy Discharge home with home  health Diet - Cardiac diet Follow up - in 2 weeks Activity - WBAT Disposition - Home Condition Upon Discharge - Good D/C Meds - See DC Summary DVT Prophylaxis - Xarelto  Paul Pineda 07/17/2013, 7:45 AM

## 2013-07-16 NOTE — Progress Notes (Signed)
Physical Therapy Treatment Patient Details Name: JAKARI SADA MRN: [id number] DOB: [birth year] Today's Date: 07/16/2013 Time: 1020-1046 PT Time Calculation (min): 26 min  PT Assessment / Plan / Recommendation  History of Present Illness s/p Right TKA   PT Comments   Pt ambulated in hallway, practiced stairs with spouse, and performed exercises.  Spouse and pt provided with HEP and stairs handouts.  Pt and spouse had no further questions/concerns and pt feels ready for d/c home today.   Follow Up Recommendations  Home health PT     Does the patient have the potential to tolerate intense rehabilitation     Barriers to Discharge        Equipment Recommendations  None recommended by PT    Recommendations for Other Services    Frequency 7X/week   Progress towards PT Goals Progress towards PT goals: Progressing toward goals  Plan Current plan remains appropriate    Precautions / Restrictions Precautions Precautions: Knee;Fall Required Braces or Orthoses: Knee Immobilizer - Right Knee Immobilizer - Right: Discontinue once straight leg raise with < 10 degree lag Restrictions Other Position/Activity Restrictions: WBAT   Pertinent Vitals/Pain Ice applied end of session    Mobility  Bed Mobility Bed Mobility: Supine to Sit Supine to Sit: 5: Supervision Details for Bed Mobility Assistance: verbal cue to apply KI prior to EOB Transfers Transfers: Sit to Stand;Stand to Sit Sit to Stand: 5: Supervision;From bed;With upper extremity assist Stand to Sit: 5: Supervision;To chair/3-in-1;With upper extremity assist Details for Transfer Assistance: cues for hand placement Ambulation/Gait Ambulation/Gait Assistance: 4: Min guard;5: Supervision Ambulation Distance (Feet): 80 Feet Assistive device: Rolling walker Ambulation/Gait Assistance Details: verbal cues for RW distance, UEs for pain control Gait Pattern: Step-to pattern;Antalgic Gait velocity: decr Stairs: Yes Stairs  Assistance: 4: Min guard Stairs Assistance Details (indicate cue type and reason): pt and spouse educated on safe technique and sequence,  spouse held RW during traversing steps, provided with handout Stair Management Technique: Step to pattern;Backwards;With walker Number of Stairs: 3    Exercises Total Joint Exercises Ankle Circles/Pumps: AROM;Both;15 reps Quad Sets: AROM;Both;20 reps Short Arc Quad: AROM;Right;15 reps Heel Slides: AAROM;Right;15 reps Hip ABduction/ADduction: 15 reps;AAROM;Right Straight Leg Raises: AAROM;Right;10 reps Goniometric ROM: grossly 70* R knee flexion today, pt reports feels more stiff today   PT Diagnosis:    PT Problem List:   PT Treatment Interventions:     PT Goals (current goals can now be found in the care plan section)    Visit Information  Last PT Received On: 07/16/13 Assistance Needed: +1 History of Present Illness: s/p Right TKA    Subjective Data      Cognition  Cognition Arousal/Alertness: Awake/alert Behavior During Therapy: WFL for tasks assessed/performed Overall Cognitive Status: Within Functional Limits for tasks assessed    Balance     End of Session PT - End of Session Equipment Utilized During Treatment: Right knee immobilizer Activity Tolerance: Patient tolerated treatment well Patient left: in chair;with call bell/phone within reach;with family/visitor present   GP     Jarielys Girardot,KATHrine E 07/16/2013, 1:38 PM Zenovia Jarred, PT, DPT 07/16/2013 Pager: [phone]

## 2013-07-17 NOTE — Progress Notes (Signed)
Discharge summary sent to payer through [facility]  

## 2013-08-15 ENCOUNTER — Observation Stay (HOSPITAL_COMMUNITY)
Admission: EM | Admit: 2013-08-15 | Discharge: 2013-08-16 | DRG: 312 | Disposition: A | Discharge status: Home / Self Care | Payer: Medicare Other | Attending: Internal Medicine | Admitting: Internal Medicine

## 2013-08-15 ENCOUNTER — Encounter (HOSPITAL_COMMUNITY): Payer: Self-pay | Admitting: Emergency Medicine

## 2013-08-15 ENCOUNTER — Emergency Department (HOSPITAL_COMMUNITY): Payer: Medicare Other

## 2013-08-15 DIAGNOSIS — R000 Tachycardia, unspecified: Secondary | ICD-10-CM | POA: Diagnosis present

## 2013-08-15 DIAGNOSIS — M129 Arthropathy, unspecified: Secondary | ICD-10-CM | POA: Insufficient documentation

## 2013-08-15 DIAGNOSIS — M1710 Unilateral primary osteoarthritis, unspecified knee: Secondary | ICD-10-CM

## 2013-08-15 DIAGNOSIS — E860 Dehydration: Secondary | ICD-10-CM | POA: Diagnosis present

## 2013-08-15 DIAGNOSIS — Z87891 Personal history of nicotine dependence: Secondary | ICD-10-CM | POA: Insufficient documentation

## 2013-08-15 DIAGNOSIS — D62 Acute posthemorrhagic anemia: Secondary | ICD-10-CM

## 2013-08-15 DIAGNOSIS — R55 Syncope and collapse: Principal | ICD-10-CM | POA: Diagnosis present

## 2013-08-15 DIAGNOSIS — M179 Osteoarthritis of knee, unspecified: Secondary | ICD-10-CM

## 2013-08-15 DIAGNOSIS — Z7982 Long term (current) use of aspirin: Secondary | ICD-10-CM | POA: Insufficient documentation

## 2013-08-15 DIAGNOSIS — I447 Left bundle-branch block, unspecified: Secondary | ICD-10-CM | POA: Insufficient documentation

## 2013-08-15 DIAGNOSIS — I10 Essential (primary) hypertension: Secondary | ICD-10-CM | POA: Insufficient documentation

## 2013-08-15 DIAGNOSIS — E785 Hyperlipidemia, unspecified: Secondary | ICD-10-CM | POA: Insufficient documentation

## 2013-08-15 DIAGNOSIS — Z96659 Presence of unspecified artificial knee joint: Secondary | ICD-10-CM | POA: Insufficient documentation

## 2013-08-15 LAB — CBC
HEMATOCRIT: 40.3 % (ref 39.0–52.0)
Hemoglobin: 13.5 g/dL (ref 13.0–17.0)
MCH: 33.6 pg (ref 26.0–34.0)
MCHC: 33.5 g/dL (ref 30.0–36.0)
MCV: 100.2 fL — ABNORMAL HIGH (ref 78.0–100.0)
Platelets: 166 10*3/uL (ref 150–400)
RBC: 4.02 MIL/uL — AB (ref 4.22–5.81)
RDW: 13 % (ref 11.5–15.5)
WBC: 8.7 10*3/uL (ref 4.0–10.5)

## 2013-08-15 LAB — POCT I-STAT TROPONIN I: TROPONIN I, POC: 0.01 ng/mL (ref 0.00–0.08)

## 2013-08-15 LAB — PHOSPHORUS: Phosphorus: 3.3 mg/dL (ref 2.3–4.6)

## 2013-08-15 LAB — BASIC METABOLIC PANEL
BUN: 26 mg/dL — AB (ref 6–23)
CO2: 22 mEq/L (ref 19–32)
Calcium: 9.5 mg/dL (ref 8.4–10.5)
Chloride: 103 mEq/L (ref 96–112)
Creatinine, Ser: 1.21 mg/dL (ref 0.50–1.35)
GFR calc Af Amer: 71 mL/min — ABNORMAL LOW (ref >90)
GFR calc non Af Amer: 61 mL/min — ABNORMAL LOW (ref >90)
GLUCOSE: 100 mg/dL — AB (ref 70–99)
POTASSIUM: 4.5 meq/L (ref 3.7–5.3)
Sodium: 141 mEq/L (ref 137–147)

## 2013-08-15 LAB — PRO B NATRIURETIC PEPTIDE: Pro B Natriuretic peptide (BNP): 395.8 pg/mL — ABNORMAL HIGH (ref 0–125)

## 2013-08-15 LAB — MAGNESIUM: MAGNESIUM: 2.2 mg/dL (ref 1.5–2.5)

## 2013-08-15 MED ORDER — SODIUM CHLORIDE 0.9 % IJ SOLN
3.0000 mL | Freq: Two times a day (BID) | INTRAMUSCULAR | Status: DC
  Administered 2013-08-16: 3 mL via INTRAVENOUS

## 2013-08-15 MED ORDER — ENOXAPARIN SODIUM 40 MG/0.4ML ~~LOC~~ SOLN
40.0000 mg | SUBCUTANEOUS | Status: DC
  Administered 2013-08-16: 40 mg via SUBCUTANEOUS
  Filled 2013-08-15: qty 0.4

## 2013-08-15 MED ORDER — ONDANSETRON HCL 4 MG PO TABS: 4.0000 mg | ORAL_TABLET | Freq: Four times a day (QID) | ORAL | Status: DC | PRN

## 2013-08-15 MED ORDER — SODIUM CHLORIDE 0.9 % IV SOLN: INTRAVENOUS | Status: AC

## 2013-08-15 MED ORDER — ACETAMINOPHEN 325 MG PO TABS: 650.0000 mg | ORAL_TABLET | Freq: Four times a day (QID) | ORAL | Status: DC | PRN

## 2013-08-15 MED ORDER — DOCUSATE SODIUM 100 MG PO CAPS
100.0000 mg | ORAL_CAPSULE | Freq: Two times a day (BID) | ORAL | Status: DC
  Administered 2013-08-16: 100 mg via ORAL
  Filled 2013-08-15 (×3): qty 1

## 2013-08-15 MED ORDER — ASPIRIN 81 MG PO CHEW
81.0000 mg | CHEWABLE_TABLET | Freq: Every day | ORAL | Status: DC
  Administered 2013-08-16: 81 mg via ORAL
  Filled 2013-08-15: qty 1

## 2013-08-15 MED ORDER — TRAMADOL HCL 50 MG PO TABS: 50.0000 mg | ORAL_TABLET | Freq: Four times a day (QID) | ORAL | Status: DC | PRN

## 2013-08-15 MED ORDER — SODIUM CHLORIDE 0.9 % IV BOLUS (SEPSIS)
1000.0000 mL | Freq: Once | INTRAVENOUS | Status: AC
  Administered 2013-08-15: 1000 mL via INTRAVENOUS

## 2013-08-15 MED ORDER — IOHEXOL 350 MG/ML SOLN
80.0000 mL | Freq: Once | INTRAVENOUS | Status: AC | PRN
  Administered 2013-08-15: 80 mL via INTRAVENOUS

## 2013-08-15 MED ORDER — HYDROCODONE-ACETAMINOPHEN 5-325 MG PO TABS: 1.0000 | ORAL_TABLET | ORAL | Status: DC | PRN

## 2013-08-15 MED ORDER — ACETAMINOPHEN 650 MG RE SUPP: 650.0000 mg | Freq: Four times a day (QID) | RECTAL | Status: DC | PRN

## 2013-08-15 MED ORDER — METOPROLOL TARTRATE 50 MG PO TABS
50.0000 mg | ORAL_TABLET | Freq: Two times a day (BID) | ORAL | Status: DC
Start: 2013-08-16 — End: 2013-08-16
  Administered 2013-08-16 (×2): 50 mg via ORAL
  Filled 2013-08-15 (×3): qty 1

## 2013-08-15 MED ORDER — ONDANSETRON HCL 4 MG/2ML IJ SOLN: 4.0000 mg | Freq: Four times a day (QID) | INTRAMUSCULAR | Status: DC | PRN

## 2013-08-15 NOTE — ED Notes (Signed)
Pt sent from PCP with tachycardia today; pt with with hx of multiple near syncope and syncope episodes over last month; pt with some elevated kidney enzymes per PCP yesterday; pt denies CP or SOB

## 2013-08-15 NOTE — ED Provider Notes (Signed)
CSN: [id number]     Arrival date & time 08/15/13  1647 History   First MD Initiated Contact with Patient 08/15/13 1729     Chief Complaint  Patient presents with  . Loss of Consciousness  . Tachycardia   (Consider location/radiation/quality/duration/timing/severity/associated sxs/prior Treatment) Patient is a 68 y.o. male presenting with syncope. The history is provided by the patient.  Loss of Consciousness Episode history:  Multiple Most recent episode:  More than 2 days ago Duration:  10 seconds Timing:  Intermittent Progression:  Waxing and waning Chronicity:  New Context: standing up   Context: not blood draw, not bowel movement, not medication change, not with normal activity and not sight of blood   Witnessed: no   Relieved by:  Bed rest Worsened by:  Nothing tried Ineffective treatments:  None tried Associated symptoms: no anxiety, no chest pain, no confusion, no difficulty breathing, no fever, no headaches, no palpitations, no seizures, no shortness of breath and no vomiting   Risk factors: no congenital heart disease, no coronary artery disease and no seizures     Past Medical History  Diagnosis Date  . Arthritis   . Hyperlipidemia   . Hypertension   . Pneumonia     hx  . Heart murmur   . Cancer     chest lump ,scrotum  . Left bundle branch block    Past Surgical History  Procedure Laterality Date  . Lumps      scrotum, chest   . Cardiac catheterization  10/11/2009    no sign CAD, EF 45-50%  . Insertion of mesh Right 04/29/2013    Procedure: INSERTION OF MESH;  Surgeon: Ralene Ok, MD;  Location: [place];  Service: General;  Laterality: Right;  . Inguinal hernia repair Right 04/29/2013    Procedure: HERNIA REPAIR INGUINAL ADULT;  Surgeon: Ralene Ok, MD;  Location: [place];  Service: General;  Laterality: Right;  . Total knee arthroplasty Right 07/14/2013    Procedure: RIGHT TOTAL KNEE ARTHROPLASTY;  Surgeon: Gearlean Alf, MD;  Location: [facility];   Service: Orthopedics;  Laterality: Right;   Family History  Problem Relation Age of Onset  . Cancer Father     brain   History  Substance Use Topics  . Smoking status: Former Smoker -- 2.00 packs/day for 10 years    Types: Cigarettes    Quit date: 04/04/1985  . Smokeless tobacco: Never Used  . Alcohol Use: 8.4 oz/week    14 Cans of beer per week     Comment: 1-2 daily-burbon on rocks nightly    Review of Systems  Constitutional: Negative for fever and chills.  HENT: Negative for congestion and facial swelling.   Eyes: Negative for discharge and visual disturbance.  Respiratory: Negative for shortness of breath.   Cardiovascular: Positive for syncope. Negative for chest pain and palpitations.  Gastrointestinal: Negative for vomiting, abdominal pain and diarrhea.  Musculoskeletal: Negative for arthralgias and myalgias.  Skin: Negative for color change and rash.  Neurological: Positive for syncope. Negative for tremors, seizures and headaches.  Psychiatric/Behavioral: Negative for confusion and dysphoric mood.    Allergies  Review of patient's allergies indicates no known allergies.  Home Medications   No current outpatient prescriptions on file. BP 133/78  Pulse 80  Temp(Src) 97.8 F (36.6 C) (Oral)  Resp 18  Ht 5\' 11"  (1.803 m)  Wt 208 lb 14.4 oz (94.756 kg)  BMI 29.15 kg/m2  SpO2 95% Physical Exam  Constitutional: He is oriented to  person, place, and time. He appears well-developed and well-nourished.  HENT:  Head: Normocephalic and atraumatic.  Eyes: EOM are normal. Pupils are equal, round, and reactive to light.  Neck: Normal range of motion. Neck supple. No JVD present.  Cardiovascular: Regular rhythm.  Exam reveals no gallop and no friction rub.   No murmur heard. Tachycardia   Pulmonary/Chest: No respiratory distress. He has no wheezes.  Abdominal: He exhibits no distension. There is no rebound and no guarding.  Musculoskeletal: Normal range of motion.   Neurological: He is alert and oriented to person, place, and time.  Skin: No rash noted. No pallor.  Psychiatric: He has a normal mood and affect. His behavior is normal.    ED Course  Procedures (including critical care time) Labs Review Labs Reviewed  CBC - Abnormal; Notable for the following:    RBC 4.02 (*)    MCV 100.2 (*)    All other components within normal limits  BASIC METABOLIC PANEL - Abnormal; Notable for the following:    Glucose, Bld 100 (*)    BUN 26 (*)    GFR calc non Af Amer 61 (*)    GFR calc Af Amer 71 (*)    All other components within normal limits  PRO B NATRIURETIC PEPTIDE - Abnormal; Notable for the following:    Pro B Natriuretic peptide (BNP) 395.8 (*)    All other components within normal limits  MAGNESIUM  PHOSPHORUS  MAGNESIUM  PHOSPHORUS  TSH  COMPREHENSIVE METABOLIC PANEL  CBC  CBC  CREATININE, SERUM  TROPONIN I  TROPONIN I  TROPONIN I  HEMOGLOBIN A1C  URINALYSIS, ROUTINE W REFLEX MICROSCOPIC  POCT I-STAT TROPONIN I   Imaging Review Dg Chest 2 View  08/15/2013   CLINICAL DATA:  Loss of consciousness  EXAM: CHEST  2 VIEW  COMPARISON:  DG CHEST 2 VIEW dated 04/28/2013  FINDINGS: Normal mediastinum and cardiac silhouette. Normal pulmonary vasculature. No evidence of effusion, infiltrate, or pneumothorax. No acute bony abnormality. Degenerative osteophytosis of the thoracic spine.  IMPRESSION: No acute cardiopulmonary process.   Electronically Signed   By: Suzy Bouchard M.D.   On: 08/15/2013 18:06   Ct Angio Chest W/cm &/or Wo Cm  08/15/2013   CLINICAL DATA:  Tachycardia and near syncopal and syncopal episodes  EXAM: CT ANGIOGRAPHY CHEST WITH CONTRAST  TECHNIQUE: Multidetector CT imaging of the chest was performed using the standard protocol during bolus administration of intravenous contrast. Multiplanar CT image reconstructions including MIPs were obtained to evaluate the vascular anatomy.  CONTRAST:  34mL OMNIPAQUE IOHEXOL 350 MG/ML SOLN  intravenously  COMPARISON:  Chest x-ray of August 15, 2013  FINDINGS: Contrast within the pulmonary arterial tree is within the limits of normal. There are no filling defects to suggest an acute pulmonary embolism embolism. The cardiac chambers are normal in size. The caliber of the thoracic aorta is normal. There is a small hiatal hernia. There is no mediastinal nor hilar lymphadenopathy. There is no pleural nor pericardial effusion.  At lung window settings there is no interstitial or alveolar infiltrate. There are no findings suspicious for pulmonary infarction. No abnormal pulmonary parenchymal nodules are demonstrated.  Within the upper abdomen the observed portions of the liver and spleen appear normal. The gallbladder is contracted. The thoracic vertebral bodies are preserved in height. There are bridging osteophytes at T10-T11 and T12-L1. The sternum is normal in appearance.  Review of the MIP images confirms the above findings.  IMPRESSION: 1. There is no  evidence of an acute pulmonary embolism. No acute thoracic aortic pathology is demonstrated. 2. There is no evidence of CHF or pneumonia. No pulmonary parenchymal masses are demonstrated. There is no mediastinal or hilar lymphadenopathy. 3. There is no evidence of a pleural nor pericardial effusion. 4. The gallbladder is contracted. This may be due to recent ingestion of a meal, but correlation with the timing of the patient's most recent meal is needed.   Electronically Signed   By: David  Martinique   On: 08/15/2013 19:19    EKG Interpretation    Date/Time:  Friday August 15 2013 16:56:16 EST Ventricular Rate:  140 PR Interval:  144 QRS Duration: 130 QT Interval:  302 QTC Calculation: 461 R Axis:   13 Text Interpretation:  Sinus tachycardia Left bundle branch block Abnormal ECG Confirmed by DOCHERTY  MD, MEGAN ([id number]) on 08/15/2013 7:19:42 PM            MDM   1. Syncope and collapse   2. Tachycardia   3. Dehydration    68 y/o M  recent knee surgery a month ago, with multiple pre syncopal episodes over the past month.  Only one syncopal episode, fell to the ground, immediately back.  This happened about 4 days ago.  Denies chest pain, shortness of breath, palpitations.  ECG with wide qrs, LBBB.  PE study negative, other labs unremarkable, will check mg, phos, consult cards. Cardiology feel that this is not secondary to his tachycardia.  Consulted medicine.  Will admit for dehydration.    Deno Etienne, MD 08/16/13 [phone]

## 2013-08-15 NOTE — ED Notes (Signed)
Meal tray ordered 

## 2013-08-15 NOTE — H&P (Signed)
PCP:  Jonathon Bellows, MD    Chief Complaint:  syncope  HPI: Paul Pineda is a 68 y.o. male   has a past medical history of Arthritis; Hyperlipidemia; Hypertension; Pneumonia; Heart murmur; Cancer; and Left bundle branch block.   Presented with  Syncopal episode 3 days ago. Pateitn have had right knee replaced on 07/14/13 post operatively he had struggled with Poor Po intake.  Last 2 weeks he started to have episodes of light headedness associated with rapidly standing up. Last week he had episode of persistent vomiting which he attributed to polypharmacy. Prior to this for the past 1 month he reports poor PO intake that has just improved for the past 1 week.  Denies any diarrhea nausea currently. 3 days ago he had an episode of syncope after he stood up rapidly.  2 days ago he went to PT treatment and was found to have blood pressure 100/80 he went to his PCP and was told to dc his Toprolol and lisinopril. Today he noted his blood pressure went back up and took lisinopril but not toprolol. When he went to PT today his HR was up to 140's. He was told to come to ER. In ER he was found to be orthostatic in sinus tachycardia up to 140's . He was given 2L of NS with improvement of HR down to 90's. Given hx of recent syncope Hospitalist was called for admission. Of note despite getting IVF patient have not urinated in the past 8 hours.   Review of Systems:    Pertinent positives include: weight loss nausea, vomiting, change in color of urine,   Constitutional:  No weight loss, night sweats, Fevers, chills, fatigue,  HEENT:  No headaches, Difficulty swallowing,Tooth/dental problems,Sore throat,  No sneezing, itching, ear ache, nasal congestion, post nasal drip,  Cardio-vascular:  No chest pain, Orthopnea, PND, anasarca, dizziness, palpitations.no Bilateral lower extremity swelling  GI:  No heartburn, indigestion, abdominal pain, diarrhea, change in bowel habits, loss of appetite, melena, blood  in stool, hematemesis Resp:  no shortness of breath at rest. No dyspnea on exertion, No excess mucus, no productive cough, No non-productive cough, No coughing up of blood.No change in color of mucus.No wheezing. Skin:  no rash or lesions. No jaundice GU:  no dysuria, no urgency or frequency. No straining to urinate.  No flank pain.  Musculoskeletal:  No joint pain or no joint swelling. No decreased range of motion. No back pain.  Psych:  No change in mood or affect. No depression or anxiety. No memory loss.  Neuro: no localizing neurological complaints, no tingling, no weakness, no double vision, no gait abnormality, no slurred speech, no confusion  Otherwise ROS are negative except for above, 10 systems were reviewed  Past Medical History: Past Medical History  Diagnosis Date  . Arthritis   . Hyperlipidemia   . Hypertension   . Pneumonia     hx  . Heart murmur   . Cancer     chest lump ,scrotum  . Left bundle branch block    Past Surgical History  Procedure Laterality Date  . Lumps      scrotum, chest   . Cardiac catheterization  10/11/2009    no sign CAD, EF 45-50%  . Insertion of mesh Right 04/29/2013    Procedure: INSERTION OF MESH;  Surgeon: Ralene Ok, MD;  Location: [place];  Service: General;  Laterality: Right;  . Inguinal hernia repair Right 04/29/2013    Procedure: HERNIA REPAIR INGUINAL  ADULT;  Surgeon: Ralene Ok, MD;  Location: [place];  Service: General;  Laterality: Right;  . Total knee arthroplasty Right 07/14/2013    Procedure: RIGHT TOTAL KNEE ARTHROPLASTY;  Surgeon: Gearlean Alf, MD;  Location: [facility];  Service: Orthopedics;  Laterality: Right;     Medications: Prior to Admission medications   Medication Sig Start Date End Date Taking? Authorizing Provider  aspirin 81 MG tablet Take 81 mg by mouth daily.   Yes Historical Provider, MD  lisinopril (PRINIVIL,ZESTRIL) 20 MG tablet Take 20 mg by mouth daily with breakfast.    Yes Historical  Provider, MD  metoprolol succinate (TOPROL-XL) 100 MG 24 hr tablet Take 100 mg by mouth daily with breakfast. Take with or immediately following a meal.   Yes Historical Provider, MD  traMADol (ULTRAM) 50 MG tablet Take 1-2 tablets (50-100 mg total) by mouth every 6 (six) hours as needed (mild pain). 07/16/13  Yes Alexzandrew Dara Lords, PA-C    Allergies:  No Known Allergies  Social History:  Ambulatory   independently   Lives at   Home with family   reports that he quit smoking about 28 years ago. His smoking use included Cigarettes. He has a 20 pack-year smoking history. He has never used smokeless tobacco. He reports that he drinks about 8.4 ounces of alcohol per week. He reports that he does not use illicit drugs.   Family History: family history includes Cancer in his father.    Physical Exam: Patient Vitals for the past 24 hrs:  BP Temp Temp src Pulse Resp SpO2 Height Weight  08/15/13 2104 127/77 mmHg - - 84 20 99 % - -  08/15/13 2100 127/77 mmHg - - 98 24 99 % - -  08/15/13 2000 140/74 mmHg - - 101 19 98 % - -  08/15/13 1816 135/70 mmHg - - 139 - - - -  08/15/13 1815 118/73 mmHg - - 109 - 99 % - -  08/15/13 1814 118/73 mmHg - - 115 - - - -  08/15/13 1813 126/74 mmHg - - 93 - - - -  08/15/13 1730 118/90 mmHg - - 122 18 99 % - -  08/15/13 1705 - - - - - - 5\' 11"  (1.803 m) 93.441 kg (206 lb)  08/15/13 1701 109/89 mmHg 97.9 F (36.6 C) Oral 143 18 98 % - -    1. General:  in No Acute distress 2. Psychological: Alert and   Oriented 3. Head/ENT:   Moist   Mucous Membranes                          Head Non traumatic, neck supple                          Normal   Dentition 4. SKIN:   decreased Skin turgor,  Skin clean Dry and intact no rash 5. Heart: Regular rate and rhythm no Murmur, Rub or gallop 6. Lungs: Clear to auscultation bilaterally, no wheezes or crackles   7. Abdomen: Soft, non-tender, Non distended 8. Lower extremities: no clubbing, cyanosis, or edema 9.  Neurologically Grossly intact, moving all 4 extremities equally 10. MSK: Normal range of motion  body mass index is 28.74 kg/(m^2).   Labs on Admission:   Recent Labs  08/15/13 1735  NA 141  K 4.5  CL 103  CO2 22  GLUCOSE 100*  BUN 26*  CREATININE 1.21  CALCIUM 9.5  MG 2.2  PHOS 3.3   No results found for this basename: AST, ALT, ALKPHOS, BILITOT, PROT, ALBUMIN,  in the last 72 hours No results found for this basename: LIPASE, AMYLASE,  in the last 72 hours  Recent Labs  08/15/13 1735  WBC 8.7  HGB 13.5  HCT 40.3  MCV 100.2*  PLT 166   No results found for this basename: CKTOTAL, CKMB, CKMBINDEX, TROPONINI,  in the last 72 hours No results found for this basename: TSH, T4TOTAL, FREET3, T3FREE, THYROIDAB,  in the last 72 hours No results found for this basename: VITAMINB12, FOLATE, FERRITIN, TIBC, IRON, RETICCTPCT,  in the last 72 hours No results found for this basename: HGBA1C    Estimated Creatinine Clearance: 71 ml/min (by C-G formula based on Cr of 1.21). ABG No results found for this basename: phart, pco2, po2, hco3, tco2, acidbasedef, o2sat     No results found for this basename: DDIMER     Other results:  I have pearsonaly reviewed this: ECG REPORT  Rate: 140  Rhythm: LBBB ST&T Change:    Cultures: No results found for this basename: sdes, specrequest, cult, reptstatus       Radiological Exams on Admission: Dg Chest 2 View  08/15/2013   CLINICAL DATA:  Loss of consciousness  EXAM: CHEST  2 VIEW  COMPARISON:  DG CHEST 2 VIEW dated 04/28/2013  FINDINGS: Normal mediastinum and cardiac silhouette. Normal pulmonary vasculature. No evidence of effusion, infiltrate, or pneumothorax. No acute bony abnormality. Degenerative osteophytosis of the thoracic spine.  IMPRESSION: No acute cardiopulmonary process.   Electronically Signed   By: Suzy Bouchard M.D.   On: 08/15/2013 18:06   Ct Angio Chest W/cm &/or Wo Cm  08/15/2013   CLINICAL DATA:  Tachycardia  and near syncopal and syncopal episodes  EXAM: CT ANGIOGRAPHY CHEST WITH CONTRAST  TECHNIQUE: Multidetector CT imaging of the chest was performed using the standard protocol during bolus administration of intravenous contrast. Multiplanar CT image reconstructions including MIPs were obtained to evaluate the vascular anatomy.  CONTRAST:  34mL OMNIPAQUE IOHEXOL 350 MG/ML SOLN intravenously  COMPARISON:  Chest x-ray of August 15, 2013  FINDINGS: Contrast within the pulmonary arterial tree is within the limits of normal. There are no filling defects to suggest an acute pulmonary embolism embolism. The cardiac chambers are normal in size. The caliber of the thoracic aorta is normal. There is a small hiatal hernia. There is no mediastinal nor hilar lymphadenopathy. There is no pleural nor pericardial effusion.  At lung window settings there is no interstitial or alveolar infiltrate. There are no findings suspicious for pulmonary infarction. No abnormal pulmonary parenchymal nodules are demonstrated.  Within the upper abdomen the observed portions of the liver and spleen appear normal. The gallbladder is contracted. The thoracic vertebral bodies are preserved in height. There are bridging osteophytes at T10-T11 and T12-L1. The sternum is normal in appearance.  Review of the MIP images confirms the above findings.  IMPRESSION: 1. There is no evidence of an acute pulmonary embolism. No acute thoracic aortic pathology is demonstrated. 2. There is no evidence of CHF or pneumonia. No pulmonary parenchymal masses are demonstrated. There is no mediastinal or hilar lymphadenopathy. 3. There is no evidence of a pleural nor pericardial effusion. 4. The gallbladder is contracted. This may be due to recent ingestion of a meal, but correlation with the timing of the patient's most recent meal is needed.   Electronically Signed   By: David  Martinique  On: 08/15/2013 19:19    Chart has been reviewed  Assessment/Plan  68 Yo Male here  with dehydration due to recent hx of poor PO intake, and vomiting now improving after IVF. Given syncope will admit for further rehydration and evaluation.  Present on Admission:  . Syncope and collapse - most likely due to orthostatic hypotension, but given advance age and hx of HTN will cycle ce, serial ECG, obtain echo and cacrotid dopplers. Continue to rehydrate check orthostatics in AM to document resolution.  . Tachycardia - likely combination of dehydration and rebound tachycardia after stopping toprolol. Will split toprolol 100 qd into metoprolol 50 mg BID with holding paramenters for now, Episode of hypotension was likely in the setting of dehydration . Dehydration - IVF, hold lisinopril given slight increase in cr for now, can restart by PCP as an outpatient.  HTN -restart metoprolol and would restart lisinopril at a later date  Prophylaxis: Lovenox, Protonix  CODE STATUS: FULL CODE  Other plan as per orders.  I have spent a total of 55 min on this admission  Obaloluwa Delatte 08/15/2013, 10:10 PM

## 2013-08-15 NOTE — ED Notes (Signed)
Admitting MD at bedside.

## 2013-08-16 DIAGNOSIS — D62 Acute posthemorrhagic anemia: Secondary | ICD-10-CM

## 2013-08-16 DIAGNOSIS — IMO0002 Reserved for concepts with insufficient information to code with codable children: Secondary | ICD-10-CM

## 2013-08-16 DIAGNOSIS — M1710 Unilateral primary osteoarthritis, unspecified knee: Secondary | ICD-10-CM

## 2013-08-16 DIAGNOSIS — I519 Heart disease, unspecified: Secondary | ICD-10-CM

## 2013-08-16 LAB — CBC
HCT: 34.5 % — ABNORMAL LOW (ref 39.0–52.0)
Hemoglobin: 11.2 g/dL — ABNORMAL LOW (ref 13.0–17.0)
MCH: 32.9 pg (ref 26.0–34.0)
MCHC: 32.5 g/dL (ref 30.0–36.0)
MCV: 101.5 fL — AB (ref 78.0–100.0)
PLATELETS: 137 10*3/uL — AB (ref 150–400)
RBC: 3.4 MIL/uL — AB (ref 4.22–5.81)
RDW: 13.5 % (ref 11.5–15.5)
WBC: 6.6 10*3/uL (ref 4.0–10.5)

## 2013-08-16 LAB — COMPREHENSIVE METABOLIC PANEL
ALBUMIN: 3 g/dL — AB (ref 3.5–5.2)
ALK PHOS: 69 U/L (ref 39–117)
ALT: 39 U/L (ref 0–53)
AST: 34 U/L (ref 0–37)
BUN: 22 mg/dL (ref 6–23)
CHLORIDE: 108 meq/L (ref 96–112)
CO2: 21 meq/L (ref 19–32)
CREATININE: 1.19 mg/dL (ref 0.50–1.35)
Calcium: 8.5 mg/dL (ref 8.4–10.5)
GFR calc Af Amer: 72 mL/min — ABNORMAL LOW (ref >90)
GFR, EST NON AFRICAN AMERICAN: 62 mL/min — AB (ref >90)
Glucose, Bld: 92 mg/dL (ref 70–99)
POTASSIUM: 4.2 meq/L (ref 3.7–5.3)
Sodium: 142 mEq/L (ref 137–147)
Total Bilirubin: 0.4 mg/dL (ref 0.3–1.2)
Total Protein: 6 g/dL (ref 6.0–8.3)

## 2013-08-16 LAB — URINALYSIS, ROUTINE W REFLEX MICROSCOPIC
BILIRUBIN URINE: NEGATIVE
Glucose, UA: NEGATIVE mg/dL
HGB URINE DIPSTICK: NEGATIVE
KETONES UR: NEGATIVE mg/dL
Leukocytes, UA: NEGATIVE
Nitrite: NEGATIVE
PROTEIN: NEGATIVE mg/dL
Specific Gravity, Urine: 1.02 (ref 1.005–1.030)
Urobilinogen, UA: 0.2 mg/dL (ref 0.0–1.0)
pH: 5 (ref 5.0–8.0)

## 2013-08-16 LAB — HEMOGLOBIN A1C
Hgb A1c MFr Bld: 5.3 % (ref <5.7)
Mean Plasma Glucose: 105 mg/dL (ref <117)

## 2013-08-16 LAB — PHOSPHORUS: PHOSPHORUS: 4.2 mg/dL (ref 2.3–4.6)

## 2013-08-16 LAB — MAGNESIUM: Magnesium: 2 mg/dL (ref 1.5–2.5)

## 2013-08-16 LAB — TROPONIN I

## 2013-08-16 LAB — TSH: TSH: 5.774 u[IU]/mL — AB (ref 0.350–4.500)

## 2013-08-16 MED ORDER — DSS 100 MG PO CAPS: 100.0000 mg | ORAL_CAPSULE | Freq: Two times a day (BID) | ORAL | Status: AC | PRN

## 2013-08-16 MED ORDER — METOPROLOL TARTRATE 25 MG PO TABS
25.0000 mg | ORAL_TABLET | Freq: Two times a day (BID) | ORAL | Status: DC
  Filled 2013-08-16: qty 1

## 2013-08-16 MED ORDER — METOPROLOL TARTRATE 25 MG PO TABS: 25.0000 mg | ORAL_TABLET | Freq: Two times a day (BID) | ORAL | Status: DC

## 2013-08-16 MED ORDER — LISINOPRIL 5 MG PO TABS
5.0000 mg | ORAL_TABLET | Freq: Every day | ORAL | Status: DC
Start: 2013-08-17 — End: 2019-01-17

## 2013-08-16 NOTE — ED Provider Notes (Signed)
Medical screening examination/treatment/procedure(s) were conducted as a shared visit with resident-physician practitioner(s) and myself.  I personally evaluated the patient during the encounter.  Pt is a 68 y.o. male with pmhx as above presenting with recurrent syncopal episodes, tachycardia in setting of recent total knee replacement last month.  On PE, pt tachycardic, but asymptmatic.  Cardiopulm exam otherwise benign. EKG with further widened QRS.  HR only slightly improved w/ IVF.  Cardiology consulted, feels pt best served on [facility] service. [facility] will admit for syncope w/u. Marland Kitchen   EKG Interpretation    Date/Time:  Friday August 15 2013 16:56:16 EST Ventricular Rate:  140 PR Interval:  144 QRS Duration: 130 QT Interval:  302 QTC Calculation: 461 R Axis:   13 Text Interpretation:  Sinus tachycardia Left bundle branch block Abnormal ECG Confirmed by DOCHERTY  MD, MEGAN ([id number]) on 08/15/2013 7:19:42 PM             Paul Ehlers, MD 08/16/13 1013

## 2013-08-16 NOTE — Progress Notes (Signed)
Echocardiogram 2D Echocardiogram has been performed.  Paul Pineda 08/16/2013, 9:51 AM

## 2013-08-16 NOTE — Progress Notes (Signed)
Pt had 14 beats VT. Pt VSS. MD Rai notified. Susie Cassette

## 2013-08-16 NOTE — Discharge Summary (Signed)
Physician Discharge Summary  Patient ID: Paul Pineda MRN: [id number] DOB/AGE: [birth year] 68 y.o.  Admit date: 08/15/2013 Discharge date: 08/16/2013  Primary Care Physician:  Jonathon Bellows, MD  Discharge Diagnoses:    . Syncope and collapse . Tachycardia . Dehydration  Consults: None   Recommendations for Outpatient Follow-up:  1. please note the medication adjustments, Toprol-XL was discontinued. Lisinopril was decreased to 5 mg daily, patient was placed on metoprolol 25 mg BID.    Allergies:  No Known Allergies   Discharge Medications:   Medication List    STOP taking these medications       metoprolol succinate 100 MG 24 hr tablet  Commonly known as:  TOPROL-XL      TAKE these medications       aspirin 81 MG tablet  Take 81 mg by mouth daily.     DSS 100 MG Caps  Take 100 mg by mouth 2 (two) times daily as needed for mild constipation.     lisinopril 5 MG tablet  Commonly known as:  PRINIVIL,ZESTRIL  Take 1 tablet (5 mg total) by mouth daily.  Start taking on:  08/17/2013     metoprolol tartrate 25 MG tablet  Commonly known as:  LOPRESSOR  Take 1 tablet (25 mg total) by mouth 2 (two) times daily.  Start taking on:  08/17/2013     traMADol 50 MG tablet  Commonly known as:  ULTRAM  Take 1-2 tablets (50-100 mg total) by mouth every 6 (six) hours as needed (mild pain).         Brief H and P: For complete details please refer to admission H and P, but in brief Paul Pineda is a 68 y.o. male has a past medical history of Arthritis; Hyperlipidemia; Hypertension; Pneumonia; Heart murmur; Cancer; and Left bundle branch block. Presented with  Syncopal episode 3 days ago. Patient had right knee replaced on 07/14/13 post operatively he had struggled with Poor Po intake. Last 2 weeks he started to have episodes of light headedness associated with rapidly standing up. Last week he had episode of persistent vomiting which he attributed to polypharmacy. Prior to  this for the past 1 month he reports poor PO intake that has just improved for the past 1 week. Denies any diarrhea nausea currently. 3 days ago he had an episode of syncope after he stood up rapidly. 2 days ago he went to PT treatment and was found to have blood pressure 100/80 he went to his PCP and was told to dc his Toprolol and lisinopril. On day of admission, he noted his blood pressure went back up and took lisinopril but not toprolol. When he went to PT, his HR was up to 140's. He was told to come to ER. In ER he was found to be orthostatic, sinus tachycardia up to 140's . He was given 2L of NS with improvement of HR down to 90's. Given hx of recent syncope Hospitalist was called for admission   Hospital Course:     Syncope and collapse: Likely due to orthostatic hypotension, dehydration, beta blocker withdrawal causing a rebound tachycardia. Patient was admitted to the telemetry floor, ruled out for acute ACS. EKG showed normal sinus rhythm, old left bundle branch block. Patient was given aggressive IV fluid hydration which improved his BP and heart rate. He was restarted on beta blocker and lisinopril was held. 2-D echo was obtained which showed EF of 40-45%, normal wall motion, no regional wall motion abnormality,  no aortic stenosis. CT angiogram of the chest showed no acute pulmonary embolism, pneumonia or CHF. The patient has been ambulating without any difficulty. Given his slightly depressed ejection fraction on echo, I did recommend him to stay on lisinopril but much reduced dose at 5mg  daily. Toprol-XL 100mg  was discontinued. Instead I started him on metoprolol 25 mg BID. All these medications will need to be readjusted and evaluated at the time of his followup with his PCP.    Tachycardia: Likely due to orthostatic hypotension and rebound tachycardia from beta blocker withdrawal. Please see #1    Dehydration - Patient was provided aggressive IV fluid hydration which did improve his  symptoms  Day of Discharge BP 108/69  Pulse 54  Temp(Src) 98.2 F (36.8 C) (Oral)  Resp 16  Ht 5\' 11"  (1.803 m)  Wt 94.756 kg (208 lb 14.4 oz)  BMI 29.15 kg/m2  SpO2 94%  Physical Exam: General: Alert and awake oriented x3 not in any acute distress. HEENT: anicteric sclera, pupils reactive to light and accommodation CVS: S1-S2 clear no murmur rubs or gallops Chest: clear to auscultation bilaterally, no wheezing rales or rhonchi Abdomen: soft nontender, nondistended, normal bowel sounds Extremities: no cyanosis, clubbing or edema noted bilaterally Neuro: Cranial nerves II-XII intact, no focal neurological deficits   The results of significant diagnostics from this hospitalization (including imaging, microbiology, ancillary and laboratory) are listed below for reference.    LAB RESULTS: Basic Metabolic Panel:  Recent Labs Lab 08/15/13 1735 08/16/13 0415  NA 141 142  K 4.5 4.2  CL 103 108  CO2 22 21  GLUCOSE 100* 92  BUN 26* 22  CREATININE 1.21 1.19  CALCIUM 9.5 8.5  MG 2.2 2.0  PHOS 3.3 4.2   Liver Function Tests:  Recent Labs Lab 08/16/13 0415  AST 34  ALT 39  ALKPHOS 69  BILITOT 0.4  PROT 6.0  ALBUMIN 3.0*   No results found for this basename: LIPASE, AMYLASE,  in the last 168 hours No results found for this basename: AMMONIA,  in the last 168 hours CBC:  Recent Labs Lab 08/15/13 1735 08/16/13 0415  WBC 8.7 6.6  HGB 13.5 11.2*  HCT 40.3 34.5*  MCV 100.2* 101.5*  PLT 166 137*   Cardiac Enzymes:  Recent Labs Lab 08/16/13 0415  TROPONINI <0.30   BNP: No components found with this basename: POCBNP,  CBG: No results found for this basename: GLUCAP,  in the last 168 hours  Significant Diagnostic Studies:  Dg Chest 2 View  08/15/2013   CLINICAL DATA:  Loss of consciousness  EXAM: CHEST  2 VIEW  COMPARISON:  DG CHEST 2 VIEW dated 04/28/2013  FINDINGS: Normal mediastinum and cardiac silhouette. Normal pulmonary vasculature. No evidence of  effusion, infiltrate, or pneumothorax. No acute bony abnormality. Degenerative osteophytosis of the thoracic spine.  IMPRESSION: No acute cardiopulmonary process.   Electronically Signed   By: Suzy Bouchard M.D.   On: 08/15/2013 18:06   Ct Angio Chest W/cm &/or Wo Cm  08/15/2013   CLINICAL DATA:  Tachycardia and near syncopal and syncopal episodes  EXAM: CT ANGIOGRAPHY CHEST WITH CONTRAST  TECHNIQUE: Multidetector CT imaging of the chest was performed using the standard protocol during bolus administration of intravenous contrast. Multiplanar CT image reconstructions including MIPs were obtained to evaluate the vascular anatomy.  CONTRAST:  57mL OMNIPAQUE IOHEXOL 350 MG/ML SOLN intravenously  COMPARISON:  Chest x-ray of August 15, 2013  FINDINGS: Contrast within the pulmonary arterial tree is within the  limits of normal. There are no filling defects to suggest an acute pulmonary embolism embolism. The cardiac chambers are normal in size. The caliber of the thoracic aorta is normal. There is a small hiatal hernia. There is no mediastinal nor hilar lymphadenopathy. There is no pleural nor pericardial effusion.  At lung window settings there is no interstitial or alveolar infiltrate. There are no findings suspicious for pulmonary infarction. No abnormal pulmonary parenchymal nodules are demonstrated.  Within the upper abdomen the observed portions of the liver and spleen appear normal. The gallbladder is contracted. The thoracic vertebral bodies are preserved in height. There are bridging osteophytes at T10-T11 and T12-L1. The sternum is normal in appearance.  Review of the MIP images confirms the above findings.  IMPRESSION: 1. There is no evidence of an acute pulmonary embolism. No acute thoracic aortic pathology is demonstrated. 2. There is no evidence of CHF or pneumonia. No pulmonary parenchymal masses are demonstrated. There is no mediastinal or hilar lymphadenopathy. 3. There is no evidence of a pleural  nor pericardial effusion. 4. The gallbladder is contracted. This may be due to recent ingestion of a meal, but correlation with the timing of the patient's most recent meal is needed.   Electronically Signed   By: David  Martinique   On: 08/15/2013 19:19    2D ECHO:  Study Conclusions  Left ventricle: The cavity size was normal. Systolic function was mildly to moderately reduced. The estimated ejection fraction was in the range of 40% to 45%. Wall motion was normal; there were no regional wall motion abnormalities. There was an increased relative contribution of atrial contraction to ventricular filling.   Disposition and Follow-up:     Discharge Orders   Future Orders Complete By Expires   Diet - low sodium heart healthy  As directed    Discharge instructions  As directed    Comments:     Please check your BP daily. Hold lisinopril and metoprolol if BP is running in 90's or low 100's. Please note the medication changes. Have the log of your BP readings for 7-10 days for your primary physician to review for any medication changes. Stay hydrated.   Increase activity slowly  As directed        DISPOSITION: Home   DIET: Heart healthy diet    DISCHARGE FOLLOW-UP Follow-up Information   Follow up with [name], D, MD. Schedule an appointment as soon as possible for a visit in 10 days. (for hospital follow-up )    Specialty:  Family Medicine   Contact information:   [address] [place] [address] [phone]       Time spent on Discharge: 40 mins  Signed:   RAI,RIPUDEEP M.D. [facility] 08/16/2013, 10:54 AM Pager: [phone]

## 2013-09-22 ENCOUNTER — Encounter: Payer: Self-pay | Admitting: Family Medicine

## 2013-09-22 ENCOUNTER — Ambulatory Visit: Payer: Self-pay | Admitting: Family Medicine

## 2013-09-22 VITALS — BP 130/80 | HR 84 | Temp 98.0°F | Resp 16 | Ht 70.0 in | Wt 211.0 lb

## 2013-09-22 DIAGNOSIS — Z021 Encounter for pre-employment examination: Secondary | ICD-10-CM

## 2013-09-22 DIAGNOSIS — Z0289 Encounter for other administrative examinations: Secondary | ICD-10-CM

## 2013-09-22 NOTE — Progress Notes (Signed)
[facility] [address] [place] [address] [phone]  Date:  09/22/2013   Name:  Paul Pineda   DOB:  [birth year]   MRN:  [id number]  PCP:  Jonathon Bellows, MD    Chief Complaint: Employment Physical   History of Present Illness:  Paul Pineda is a 68 y.o. 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  He developed hypotension and dehydration post- op (thought due to intolerance of his pain meds) and did have a syncopal episode lasts month.  He was admitted and had a full work-up.  His work- up was benign.  His medications were changed and he was released after a full work- up.  He felt that his pain meds caused him to vomit and get dehydrated and weak.    No further lightheaded episodes or syncope.   He is off of the pain meds and his appetite and sleeping are much better again. He now feels quite well  His right knee was done by Dr. Sandie Ano in December.  He also had a hernia repair in September- right inguinal.  This is now doing well; he is still doing PT but [facility]   Patient Active Problem List   Diagnosis Date Noted  . Syncope and collapse 08/15/2013  . Tachycardia 08/15/2013  . Dehydration 08/15/2013  . Postoperative anemia due to acute blood loss 07/15/2013  . OA (osteoarthritis) of knee 07/14/2013    Past Medical History  Diagnosis Date  . Arthritis   . Hyperlipidemia   . Hypertension   . Pneumonia     hx  . Heart murmur   . Cancer     chest lump ,scrotum  . Left bundle branch block     Past Surgical History  Procedure Laterality Date  . Lumps      scrotum, chest   . Cardiac catheterization  10/11/2009    no sign CAD, EF 45-50%  . Insertion of mesh Right 04/29/2013    Procedure: INSERTION OF MESH;  Surgeon: Ralene Ok, MD;   Location: [facility];  Service: General;  Laterality: Right;  . Inguinal hernia repair Right 04/29/2013    Procedure: HERNIA REPAIR INGUINAL ADULT;  Surgeon: Ralene Ok, MD;  Location: [facility];  Service: General;  Laterality: Right;  . Total knee arthroplasty Right 07/14/2013    Procedure: RIGHT TOTAL KNEE ARTHROPLASTY;  Surgeon: Gearlean Alf, MD;  Location: [facility];  Service: Orthopedics;  Laterality: Right;    History  Substance Use Topics  . Smoking status: Former Smoker -- 2.00 packs/day for 10 years    Types: Cigarettes    Quit date: 04/04/1985  . Smokeless tobacco: Never Used  . Alcohol Use: 8.4 oz/week    14 Cans of beer per week     Comment: 1-2 daily-burbon on rocks nightly    Family History  Problem Relation Age of Onset  . Cancer Father     brain    No Known Allergies  Medication list has been reviewed and updated.  Current Outpatient Prescriptions on File Prior to Visit  Medication Sig Dispense Refill  . aspirin 81 MG tablet Take 81 mg by mouth daily.      Marland Kitchen lisinopril (PRINIVIL,ZESTRIL) 5 MG tablet  Take 1 tablet (5 mg total) by mouth daily.  30 tablet  3  . metoprolol tartrate (LOPRESSOR) 25 MG tablet Take 1 tablet (25 mg total) by mouth 2 (two) times daily.  60 tablet  3  . docusate sodium 100 MG CAPS Take 100 mg by mouth 2 (two) times daily as needed for mild constipation.  60 capsule  0  . traMADol (ULTRAM) 50 MG tablet Take 1-2 tablets (50-100 mg total) by mouth every 6 (six) hours as needed (mild pain).  60 tablet  1   No current facility-administered medications on file prior to visit.    Review of Systems:  As per HPI- otherwise negative.   Physical Examination: Filed Vitals:   09/22/13 1225  BP: 120/72  Pulse: 82  Temp: 98 F (36.7 C)  Resp: 16   Filed Vitals:   09/22/13 1225  Height: 5\' 10"  (1.778 m)  Weight: 211 lb (95.709 kg)   Body mass index is 30.28 kg/(m^2). Ideal Body Weight: Weight in (lb) to have BMI = 25: 173.9  GEN: WDWN,  NAD, Non-toxic, A & O x 3, looks well HEENT: Atraumatic, Normocephalic. Neck supple. No masses, No LAD. Bilateral TM wnl, oropharynx normal.  PEERL,EOMI.   Ears and Nose: No external deformity. CV: RRR, No M/G/R. No JVD. No thrill. No extra heart sounds. PULM: CTA B, no wheezes, crackles, rhonchi. No retractions. No resp. distress. No accessory muscle use. ABD: S, NT, ND. No rebound. No HSM. EXTR: No c/c/e NEURO Normal gait. Normal strength and DTR all extremities.   PSYCH: Normally interactive. Conversant. Not depressed or anxious appearing.  Calm demeanor.  Right knee has midline scar c/w knee replacement. He has good extension to nearly 0 degrees and good strength of the right leg joint with good ROM for driving a vehicle.  Scar right inguinal are - no current weakness noted.   Checked his BP lying 130/90, sitting 126/86 and standing 130/80.  Pulse as per VS  Assessment and Plan: Physical exam, pre-employment  Here for a DOT exam as he is returning to work following a knee replacement. He did have a syncopal episode but he has a thorough inpt evaluation and this is thought to be due to dehydration.  I do not see any reason why he is at increased risk for recurrent syncope.  He did have slightly low EF on his echo- 40- 45%.  While 40% is sufficient, he will need to have a follow-up echo in a year prior to next exam.  1 year card due to controlled HTN  Signed Lamar Blinks, MD

## 2013-09-22 NOTE — Patient Instructions (Signed)
If you have any recurrence of your vomiting or lightheaded feeling DO NOT DRIVE until you have been seen by your doctor.  However at this time it appears that your fainting was a one time occurrence due to dehydration.

## 2014-03-09 ENCOUNTER — Other Ambulatory Visit: Payer: Self-pay | Admitting: Dermatology

## 2014-06-05 ENCOUNTER — Other Ambulatory Visit: Payer: Self-pay | Admitting: Family Medicine

## 2014-06-05 DIAGNOSIS — Z139 Encounter for screening, unspecified: Secondary | ICD-10-CM

## 2014-06-19 ENCOUNTER — Ambulatory Visit: Payer: Medicare Other

## 2014-08-12 ENCOUNTER — Other Ambulatory Visit: Payer: Self-pay | Admitting: Dermatology

## 2015-01-25 ENCOUNTER — Other Ambulatory Visit: Payer: Self-pay

## 2015-11-26 DIAGNOSIS — H5203 Hypermetropia, bilateral: Secondary | ICD-10-CM | POA: Diagnosis not present

## 2016-01-28 DIAGNOSIS — D1801 Hemangioma of skin and subcutaneous tissue: Secondary | ICD-10-CM | POA: Diagnosis not present

## 2016-01-28 DIAGNOSIS — L821 Other seborrheic keratosis: Secondary | ICD-10-CM | POA: Diagnosis not present

## 2016-01-28 DIAGNOSIS — D235 Other benign neoplasm of skin of trunk: Secondary | ICD-10-CM | POA: Diagnosis not present

## 2016-01-28 DIAGNOSIS — L400 Psoriasis vulgaris: Secondary | ICD-10-CM | POA: Diagnosis not present

## 2016-01-28 DIAGNOSIS — Z85828 Personal history of other malignant neoplasm of skin: Secondary | ICD-10-CM | POA: Diagnosis not present

## 2016-04-27 DIAGNOSIS — Z23 Encounter for immunization: Secondary | ICD-10-CM | POA: Diagnosis not present

## 2016-04-28 DIAGNOSIS — Z111 Encounter for screening for respiratory tuberculosis: Secondary | ICD-10-CM | POA: Diagnosis not present

## 2016-04-28 DIAGNOSIS — Z125 Encounter for screening for malignant neoplasm of prostate: Secondary | ICD-10-CM | POA: Diagnosis not present

## 2016-04-28 DIAGNOSIS — E782 Mixed hyperlipidemia: Secondary | ICD-10-CM | POA: Diagnosis not present

## 2016-04-28 DIAGNOSIS — D7589 Other specified diseases of blood and blood-forming organs: Secondary | ICD-10-CM | POA: Diagnosis not present

## 2016-04-28 DIAGNOSIS — R350 Frequency of micturition: Secondary | ICD-10-CM | POA: Diagnosis not present

## 2016-06-30 DIAGNOSIS — Z0000 Encounter for general adult medical examination without abnormal findings: Secondary | ICD-10-CM | POA: Diagnosis not present

## 2016-08-01 DIAGNOSIS — L400 Psoriasis vulgaris: Secondary | ICD-10-CM | POA: Diagnosis not present

## 2016-08-01 DIAGNOSIS — C44619 Basal cell carcinoma of skin of left upper limb, including shoulder: Secondary | ICD-10-CM | POA: Diagnosis not present

## 2016-11-10 DIAGNOSIS — H5203 Hypermetropia, bilateral: Secondary | ICD-10-CM | POA: Diagnosis not present

## 2016-12-14 DIAGNOSIS — Z85828 Personal history of other malignant neoplasm of skin: Secondary | ICD-10-CM | POA: Diagnosis not present

## 2016-12-14 DIAGNOSIS — L400 Psoriasis vulgaris: Secondary | ICD-10-CM | POA: Diagnosis not present

## 2017-01-25 DIAGNOSIS — C44519 Basal cell carcinoma of skin of other part of trunk: Secondary | ICD-10-CM | POA: Diagnosis not present

## 2017-01-25 DIAGNOSIS — C44319 Basal cell carcinoma of skin of other parts of face: Secondary | ICD-10-CM | POA: Diagnosis not present

## 2017-01-25 DIAGNOSIS — L400 Psoriasis vulgaris: Secondary | ICD-10-CM | POA: Diagnosis not present

## 2017-02-16 DIAGNOSIS — L570 Actinic keratosis: Secondary | ICD-10-CM | POA: Diagnosis not present

## 2017-02-16 DIAGNOSIS — C44519 Basal cell carcinoma of skin of other part of trunk: Secondary | ICD-10-CM | POA: Diagnosis not present

## 2017-04-16 DIAGNOSIS — C44319 Basal cell carcinoma of skin of other parts of face: Secondary | ICD-10-CM | POA: Diagnosis not present

## 2017-08-20 DIAGNOSIS — Z5181 Encounter for therapeutic drug level monitoring: Secondary | ICD-10-CM | POA: Diagnosis not present

## 2017-08-20 DIAGNOSIS — E782 Mixed hyperlipidemia: Secondary | ICD-10-CM | POA: Diagnosis not present

## 2017-08-20 DIAGNOSIS — Z0000 Encounter for general adult medical examination without abnormal findings: Secondary | ICD-10-CM | POA: Diagnosis not present

## 2017-08-20 DIAGNOSIS — Z125 Encounter for screening for malignant neoplasm of prostate: Secondary | ICD-10-CM | POA: Diagnosis not present

## 2017-08-22 DIAGNOSIS — Z5181 Encounter for therapeutic drug level monitoring: Secondary | ICD-10-CM | POA: Diagnosis not present

## 2017-09-27 DIAGNOSIS — D126 Benign neoplasm of colon, unspecified: Secondary | ICD-10-CM | POA: Diagnosis not present

## 2017-09-27 DIAGNOSIS — K5730 Diverticulosis of large intestine without perforation or abscess without bleeding: Secondary | ICD-10-CM | POA: Diagnosis not present

## 2017-09-27 DIAGNOSIS — Z1211 Encounter for screening for malignant neoplasm of colon: Secondary | ICD-10-CM | POA: Diagnosis not present

## 2017-09-27 DIAGNOSIS — K640 First degree hemorrhoids: Secondary | ICD-10-CM | POA: Diagnosis not present

## 2017-10-02 DIAGNOSIS — D126 Benign neoplasm of colon, unspecified: Secondary | ICD-10-CM | POA: Diagnosis not present

## 2017-10-11 DIAGNOSIS — L400 Psoriasis vulgaris: Secondary | ICD-10-CM | POA: Diagnosis not present

## 2017-10-11 DIAGNOSIS — D044 Carcinoma in situ of skin of scalp and neck: Secondary | ICD-10-CM | POA: Diagnosis not present

## 2017-10-11 DIAGNOSIS — D485 Neoplasm of uncertain behavior of skin: Secondary | ICD-10-CM | POA: Diagnosis not present

## 2017-10-12 DIAGNOSIS — C44329 Squamous cell carcinoma of skin of other parts of face: Secondary | ICD-10-CM | POA: Diagnosis not present

## 2017-10-12 DIAGNOSIS — C4442 Squamous cell carcinoma of skin of scalp and neck: Secondary | ICD-10-CM | POA: Diagnosis not present

## 2017-11-22 DIAGNOSIS — H903 Sensorineural hearing loss, bilateral: Secondary | ICD-10-CM | POA: Insufficient documentation

## 2017-11-22 DIAGNOSIS — H9313 Tinnitus, bilateral: Secondary | ICD-10-CM | POA: Insufficient documentation

## 2017-11-27 ENCOUNTER — Other Ambulatory Visit: Payer: Self-pay | Admitting: Otolaryngology

## 2017-11-27 DIAGNOSIS — H905 Unspecified sensorineural hearing loss: Secondary | ICD-10-CM

## 2017-12-07 ENCOUNTER — Inpatient Hospital Stay
Admission: RE | Admit: 2017-12-07 | Discharge: 2017-12-07 | Disposition: A | Discharge status: Home / Self Care | Payer: Self-pay | Source: Ambulatory Visit | Attending: Otolaryngology | Admitting: Otolaryngology

## 2017-12-13 DIAGNOSIS — C44329 Squamous cell carcinoma of skin of other parts of face: Secondary | ICD-10-CM | POA: Diagnosis not present

## 2017-12-15 ENCOUNTER — Ambulatory Visit
Admission: RE | Admit: 2017-12-15 | Discharge: 2017-12-15 | Disposition: A | Discharge status: Home / Self Care | Payer: Medicare Other | Source: Ambulatory Visit | Attending: Otolaryngology | Admitting: Otolaryngology

## 2017-12-15 DIAGNOSIS — H9193 Unspecified hearing loss, bilateral: Secondary | ICD-10-CM | POA: Diagnosis not present

## 2017-12-15 DIAGNOSIS — H905 Unspecified sensorineural hearing loss: Secondary | ICD-10-CM

## 2017-12-15 MED ORDER — GADOBENATE DIMEGLUMINE 529 MG/ML IV SOLN
20.0000 mL | Freq: Once | INTRAVENOUS | Status: AC | PRN
  Administered 2017-12-15: 20 mL via INTRAVENOUS

## 2017-12-25 DIAGNOSIS — L738 Other specified follicular disorders: Secondary | ICD-10-CM | POA: Diagnosis not present

## 2017-12-25 DIAGNOSIS — L400 Psoriasis vulgaris: Secondary | ICD-10-CM | POA: Diagnosis not present

## 2017-12-25 DIAGNOSIS — L570 Actinic keratosis: Secondary | ICD-10-CM | POA: Diagnosis not present

## 2018-04-12 DIAGNOSIS — L400 Psoriasis vulgaris: Secondary | ICD-10-CM | POA: Diagnosis not present

## 2018-04-12 DIAGNOSIS — D485 Neoplasm of uncertain behavior of skin: Secondary | ICD-10-CM | POA: Diagnosis not present

## 2018-04-12 DIAGNOSIS — L819 Disorder of pigmentation, unspecified: Secondary | ICD-10-CM | POA: Diagnosis not present

## 2018-04-12 DIAGNOSIS — C44519 Basal cell carcinoma of skin of other part of trunk: Secondary | ICD-10-CM | POA: Diagnosis not present

## 2018-04-12 DIAGNOSIS — L570 Actinic keratosis: Secondary | ICD-10-CM | POA: Diagnosis not present

## 2018-04-16 DIAGNOSIS — C44629 Squamous cell carcinoma of skin of left upper limb, including shoulder: Secondary | ICD-10-CM | POA: Diagnosis not present

## 2018-04-29 DIAGNOSIS — L570 Actinic keratosis: Secondary | ICD-10-CM | POA: Diagnosis not present

## 2018-04-30 DIAGNOSIS — Z23 Encounter for immunization: Secondary | ICD-10-CM | POA: Diagnosis not present

## 2018-05-13 DIAGNOSIS — L400 Psoriasis vulgaris: Secondary | ICD-10-CM | POA: Diagnosis not present

## 2018-07-26 ENCOUNTER — Emergency Department (HOSPITAL_COMMUNITY)
Admission: EM | Admit: 2018-07-26 | Discharge: 2018-07-26 | Disposition: A | Discharge status: Home / Self Care | Payer: Medicare Other | Attending: Emergency Medicine | Admitting: Emergency Medicine

## 2018-07-26 ENCOUNTER — Encounter (HOSPITAL_COMMUNITY): Payer: Self-pay | Admitting: Emergency Medicine

## 2018-07-26 ENCOUNTER — Emergency Department (HOSPITAL_COMMUNITY): Payer: Medicare Other

## 2018-07-26 DIAGNOSIS — R600 Localized edema: Secondary | ICD-10-CM | POA: Insufficient documentation

## 2018-07-26 DIAGNOSIS — I10 Essential (primary) hypertension: Secondary | ICD-10-CM | POA: Diagnosis not present

## 2018-07-26 DIAGNOSIS — Z87891 Personal history of nicotine dependence: Secondary | ICD-10-CM | POA: Insufficient documentation

## 2018-07-26 DIAGNOSIS — R05 Cough: Secondary | ICD-10-CM | POA: Diagnosis not present

## 2018-07-26 DIAGNOSIS — R21 Rash and other nonspecific skin eruption: Secondary | ICD-10-CM | POA: Insufficient documentation

## 2018-07-26 DIAGNOSIS — M2550 Pain in unspecified joint: Secondary | ICD-10-CM | POA: Diagnosis not present

## 2018-07-26 DIAGNOSIS — Z96651 Presence of right artificial knee joint: Secondary | ICD-10-CM | POA: Diagnosis not present

## 2018-07-26 LAB — CBC WITH DIFFERENTIAL/PLATELET
ABS IMMATURE GRANULOCYTES: 0.06 10*3/uL (ref 0.00–0.07)
Basophils Absolute: 0.1 10*3/uL (ref 0.0–0.1)
Basophils Relative: 1 %
Eosinophils Absolute: 0.8 10*3/uL — ABNORMAL HIGH (ref 0.0–0.5)
Eosinophils Relative: 8 %
HCT: 44.1 % (ref 39.0–52.0)
Hemoglobin: 14.7 g/dL (ref 13.0–17.0)
IMMATURE GRANULOCYTES: 1 %
Lymphocytes Relative: 19 %
Lymphs Abs: 2.1 10*3/uL (ref 0.7–4.0)
MCH: 35.9 pg — AB (ref 26.0–34.0)
MCHC: 33.3 g/dL (ref 30.0–36.0)
MCV: 107.6 fL — ABNORMAL HIGH (ref 80.0–100.0)
MONO ABS: 1.7 10*3/uL — AB (ref 0.1–1.0)
MONOS PCT: 16 %
NEUTROS PCT: 55 %
Neutro Abs: 5.9 10*3/uL (ref 1.7–7.7)
Platelets: 167 10*3/uL (ref 150–400)
RBC: 4.1 MIL/uL — ABNORMAL LOW (ref 4.22–5.81)
RDW: 13 % (ref 11.5–15.5)
WBC: 10.6 10*3/uL — ABNORMAL HIGH (ref 4.0–10.5)
nRBC: 0 % (ref 0.0–0.2)

## 2018-07-26 LAB — I-STAT CG4 LACTIC ACID, ED: Lactic Acid, Venous: 1.9 mmol/L (ref 0.5–1.9)

## 2018-07-26 LAB — COMPREHENSIVE METABOLIC PANEL
ALT: 34 U/L (ref 0–44)
AST: 58 U/L — ABNORMAL HIGH (ref 15–41)
Albumin: 3 g/dL — ABNORMAL LOW (ref 3.5–5.0)
Alkaline Phosphatase: 63 U/L (ref 38–126)
Anion gap: 10 (ref 5–15)
BUN: 22 mg/dL (ref 8–23)
CO2: 25 mmol/L (ref 22–32)
Calcium: 8.2 mg/dL — ABNORMAL LOW (ref 8.9–10.3)
Chloride: 102 mmol/L (ref 98–111)
Creatinine, Ser: 1.26 mg/dL — ABNORMAL HIGH (ref 0.61–1.24)
GFR calc Af Amer: >60 mL/min (ref >60)
GFR calc non Af Amer: 57 mL/min — ABNORMAL LOW (ref >60)
Glucose, Bld: 104 mg/dL — ABNORMAL HIGH (ref 70–99)
Potassium: 4.5 mmol/L (ref 3.5–5.1)
Sodium: 137 mmol/L (ref 135–145)
Total Bilirubin: 1.1 mg/dL (ref 0.3–1.2)
Total Protein: 6.1 g/dL — ABNORMAL LOW (ref 6.5–8.1)

## 2018-07-26 LAB — URINALYSIS, ROUTINE W REFLEX MICROSCOPIC
Bilirubin Urine: NEGATIVE
Glucose, UA: NEGATIVE mg/dL
Hgb urine dipstick: NEGATIVE
KETONES UR: 5 mg/dL — AB
Leukocytes, UA: NEGATIVE
Nitrite: NEGATIVE
Protein, ur: NEGATIVE mg/dL
Specific Gravity, Urine: 1.023 (ref 1.005–1.030)
pH: 5 (ref 5.0–8.0)

## 2018-07-26 LAB — BRAIN NATRIURETIC PEPTIDE: B Natriuretic Peptide: 116 pg/mL — ABNORMAL HIGH (ref 0.0–100.0)

## 2018-07-26 MED ORDER — HYDROXYZINE HCL 25 MG PO TABS: 25.0000 mg | ORAL_TABLET | Freq: Four times a day (QID) | ORAL | 0 refills | Status: DC | PRN

## 2018-07-26 MED ORDER — SODIUM CHLORIDE 0.9 % IV BOLUS
1000.0000 mL | Freq: Once | INTRAVENOUS | Status: AC
  Administered 2018-07-26: 1000 mL via INTRAVENOUS

## 2018-07-26 MED ORDER — PREDNISONE 10 MG PO TABS: 40.0000 mg | ORAL_TABLET | Freq: Every day | ORAL | 0 refills | Status: AC

## 2018-07-26 NOTE — ED Triage Notes (Signed)
Pt reports that for over month had eczema really bad. Reports past 2 weeks had shakes and chills. Also adds that pain in joints for "long time but yesterday started having swelling in bilat knees, elbows and wrist.

## 2018-07-26 NOTE — Discharge Instructions (Signed)
You were seen in the ED today with rash and joint pain. Your lab work is not showing any infection at this time. I suspect at this time that your symptoms are related to a psoriasis flare. You should take the steroid as directed but call your PCP and Dermatologist on Monday to schedule an ASAP appointment. Return to the ED with any fever, trouble bleeding, chest pain, or other concerning symptoms to you.

## 2018-07-26 NOTE — ED Provider Notes (Signed)
Emergency Department Provider Note   I have reviewed the triage vital signs and the nursing notes.   HISTORY  Chief Complaint Eczema and Joint Swelling   HPI Paul Pineda is a 72 y.o. male with PMH of HLD, HTN, and psoriatic arthritis presents to the emergency department for evaluation of worsening psoriasis rash that has developed into diffuse erythema which is itchy.  Patient also notes bilateral joint swelling and pain.  Over the last several days he is developed shaking chills.  The patient is on Enbrel and follows with his dermatologist.  He states that the Enbrel was controlling his symptoms well until a new dermatologist switch the medication several months ago.  He had return of his psoriasis symptoms and arthritis pain.  He was switched back to the Enbrel several weeks ago but his symptoms have not improved.  He has been back to his dermatologist who started topical steroid medication with no improvement.  The shaking chills began over the past 2 days.  The family member at bedside states that there is been a nonproductive cough which she has noticed.  No abdominal pain, vomiting, diarrhea.  The family member pulls me aside after evaluating the patient to tell me that the patient does drink alcohol heavily.    Past Medical History:  Diagnosis Date  . Arthritis   . Cancer (HCC)    chest lump ,scrotum  . Heart murmur   . Hyperlipidemia   . Hypertension   . Left bundle branch block   . Pneumonia    hx    Patient Active Problem List   Diagnosis Date Noted  . Syncope and collapse 08/15/2013  . Tachycardia 08/15/2013  . Dehydration 08/15/2013  . Postoperative anemia due to acute blood loss 07/15/2013  . OA (osteoarthritis) of knee 07/14/2013    Past Surgical History:  Procedure Laterality Date  . CARDIAC CATHETERIZATION  10/11/2009   no sign CAD, EF 45-50%  . INGUINAL HERNIA REPAIR Right 04/29/2013   Procedure: HERNIA REPAIR INGUINAL ADULT;  Surgeon: Ralene Ok, MD;  Location: [facility];  Service: General;  Laterality: Right;  . INSERTION OF MESH Right 04/29/2013   Procedure: INSERTION OF MESH;  Surgeon: Ralene Ok, MD;  Location: [place] [facility];  Service: General;  Laterality: Right;  . lumps     scrotum, chest   . TOTAL KNEE ARTHROPLASTY Right 07/14/2013   Procedure: RIGHT TOTAL KNEE ARTHROPLASTY;  Surgeon: Gearlean Alf, MD;  Location: [facility];  Service: Orthopedics;  Laterality: Right;   Allergies Patient has no known allergies.  Family History  Problem Relation Age of Onset  . Cancer Father        brain    Social History Social History   Tobacco Use  . Smoking status: Former Smoker    Packs/day: 2.00    Years: 10.00    Pack years: 20.00    Types: Cigarettes    Last attempt to quit: 04/04/1985    Years since quitting: 33.3  . Smokeless tobacco: Never Used  Substance Use Topics  . Alcohol use: Yes    Alcohol/week: 14.0 standard drinks    Types: 14 Cans of beer per week    Comment: 1-2 daily-burbon on rocks nightly  . Drug use: No    Review of Systems  Constitutional: No fever. Positive chills.  Eyes: No visual changes. ENT: No sore throat. Cardiovascular: Denies chest pain. Respiratory: Denies shortness of breath. Gastrointestinal: No abdominal pain.  No nausea, no vomiting.  No diarrhea.  No constipation. Genitourinary: Negative for dysuria. Musculoskeletal: Negative for back pain. Positive joint swelling.  Skin: Positive diffuse itchy rash.  Neurological: Negative for headaches, focal weakness or numbness.  10-point ROS otherwise negative.  ____________________________________________   PHYSICAL EXAM:  VITAL SIGNS: ED Triage Vitals [07/26/18 0955]  Enc Vitals Group     BP 103/66     Pulse Rate 88     Resp 18     Temp 98.6 F (37 C)     Temp Source Oral     SpO2 100 %   Constitutional: Alert and oriented. Patient is bundled in multiple coats and blankets on my initial evaluation.  Eyes: Conjunctivae are  normal.  Head: Atraumatic. Nose: No congestion/rhinnorhea. Mouth/Throat: Mucous membranes are moist. No mucosal lesions.  Neck: No stridor.   Cardiovascular: Normal rate, regular rhythm. Good peripheral circulation. Grossly normal heart sounds.   Respiratory: Normal respiratory effort.  No retractions. Lungs CTAB. Gastrointestinal: Soft and nontender. No distention.  Musculoskeletal: No lower extremity tenderness nor edema. No gross deformities of extremities. Neurologic:  Normal speech and language. No gross focal neurologic deficits are appreciated.  Skin:  Skin is warm and dry. Diffuse erythematous rash over the face, trunk, arms and legs. No palms or soles involvement. Several excoriated areas over the left arm with associated bruising.    ____________________________________________   LABS (all labs ordered are listed, but only abnormal results are displayed)  Labs Reviewed  COMPREHENSIVE METABOLIC PANEL - Abnormal; Notable for the following components:      Result Value   Glucose, Bld 104 (*)    Creatinine, Ser 1.26 (*)    Calcium 8.2 (*)    Total Protein 6.1 (*)    Albumin 3.0 (*)    AST 58 (*)    GFR calc non Af Amer 57 (*)    All other components within normal limits  BRAIN NATRIURETIC PEPTIDE - Abnormal; Notable for the following components:   B Natriuretic Peptide 116.0 (*)    All other components within normal limits  CBC WITH DIFFERENTIAL/PLATELET - Abnormal; Notable for the following components:   WBC 10.6 (*)    RBC 4.10 (*)    MCV 107.6 (*)    MCH 35.9 (*)    Monocytes Absolute 1.7 (*)    Eosinophils Absolute 0.8 (*)    All other components within normal limits  URINALYSIS, ROUTINE W REFLEX MICROSCOPIC - Abnormal; Notable for the following components:   Color, Urine AMBER (*)    APPearance HAZY (*)    Ketones, ur 5 (*)    All other components within normal limits  CULTURE, BLOOD (ROUTINE X 2)  CULTURE, BLOOD (ROUTINE X 2)  I-STAT CG4 LACTIC ACID, ED    ____________________________________________  RADIOLOGY  Dg Chest 2 View  Result Date: 07/26/2018 CLINICAL DATA:  Cough, chills, aches EXAM: CHEST - 2 VIEW COMPARISON:  CT chest of 08/15/2013 and chest x-ray of the same date FINDINGS: No active infiltrate or effusion is seen. Mediastinal and hilar contours appear unchanged. The heart is mildly enlarged and stable. There are degenerative changes throughout the mid to lower thoracic spine. IMPRESSION: No pneumonia or effusion.  Stable borderline cardiomegaly. Electronically Signed   By: Ivar Drape M.D.   On: 07/26/2018 11:20    ____________________________________________   PROCEDURES  Procedure(s) performed:   Procedures  None ____________________________________________   INITIAL IMPRESSION / ASSESSMENT AND PLAN / ED COURSE  Pertinent labs & imaging results that were available during my  care of the patient were reviewed by me and considered in my medical decision making (see chart for details).  Patient with history of psoriatic arthritis on Enbrel presents with chills at home over the past several days in the setting of progressively worsening rash and joint pain/swelling.  Family does note a history of alcohol abuse.  No obvious withdrawal symptoms at this time.  Patient's vital signs are normal.  Rash is diffuse and excoriated in certain areas but does not appear to be infectious in etiology.  Plan for screening labs including blood cultures given the patient's status of being on immune compromising medications with shaking chills.   Labs reviewed. Normal lactate. Mild leukocytosis. Platelets are normal. Afebrile. Normal LFTs. No new medications to suspect acute drug reaction. Patient with close dermatology follow up. Seems consistent with acute psoriasis flare. Plan for steroid burst and atarax for itching. No role for abx at this time. Also stressed close PCP follow up. Discussed ED return precautions in detail.   ____________________________________________  FINAL CLINICAL IMPRESSION(S) / ED DIAGNOSES  Final diagnoses:  Rash  Arthralgia, unspecified joint     MEDICATIONS GIVEN DURING THIS VISIT:  Medications  sodium chloride 0.9 % bolus 1,000 mL (0 mLs Intravenous Stopped 07/26/18 1307)     NEW OUTPATIENT MEDICATIONS STARTED DURING THIS VISIT:  Discharge Medication List as of 07/26/2018  1:10 PM    START taking these medications   Details  predniSONE (DELTASONE) 10 MG tablet Take 4 tablets (40 mg total) by mouth daily for 5 days., Starting Fri 07/26/2018, Until Wed 07/31/2018, Normal        Note:  This document was prepared using Dragon voice recognition software and may include unintentional dictation errors.  Nanda Quinton, MD Emergency Medicine    Orville Mena, [facility], MD 07/26/18 [phone]

## 2018-07-31 LAB — CULTURE, BLOOD (ROUTINE X 2): Culture: NO GROWTH

## 2018-08-03 DIAGNOSIS — H524 Presbyopia: Secondary | ICD-10-CM | POA: Diagnosis not present

## 2018-08-06 DIAGNOSIS — L4050 Arthropathic psoriasis, unspecified: Secondary | ICD-10-CM | POA: Diagnosis not present

## 2018-08-06 DIAGNOSIS — L539 Erythematous condition, unspecified: Secondary | ICD-10-CM | POA: Diagnosis not present

## 2018-08-06 DIAGNOSIS — I10 Essential (primary) hypertension: Secondary | ICD-10-CM | POA: Diagnosis not present

## 2018-08-14 DIAGNOSIS — R238 Other skin changes: Secondary | ICD-10-CM | POA: Diagnosis not present

## 2018-08-14 DIAGNOSIS — D485 Neoplasm of uncertain behavior of skin: Secondary | ICD-10-CM | POA: Diagnosis not present

## 2018-08-14 DIAGNOSIS — D1801 Hemangioma of skin and subcutaneous tissue: Secondary | ICD-10-CM | POA: Diagnosis not present

## 2018-08-14 DIAGNOSIS — L989 Disorder of the skin and subcutaneous tissue, unspecified: Secondary | ICD-10-CM | POA: Diagnosis not present

## 2018-08-14 DIAGNOSIS — L400 Psoriasis vulgaris: Secondary | ICD-10-CM | POA: Diagnosis not present

## 2018-09-06 DIAGNOSIS — E782 Mixed hyperlipidemia: Secondary | ICD-10-CM | POA: Diagnosis not present

## 2018-09-06 DIAGNOSIS — I10 Essential (primary) hypertension: Secondary | ICD-10-CM | POA: Diagnosis not present

## 2018-09-06 DIAGNOSIS — Z0000 Encounter for general adult medical examination without abnormal findings: Secondary | ICD-10-CM | POA: Diagnosis not present

## 2018-09-06 DIAGNOSIS — D7589 Other specified diseases of blood and blood-forming organs: Secondary | ICD-10-CM | POA: Diagnosis not present

## 2018-09-10 DIAGNOSIS — D225 Melanocytic nevi of trunk: Secondary | ICD-10-CM | POA: Diagnosis not present

## 2018-09-10 DIAGNOSIS — L400 Psoriasis vulgaris: Secondary | ICD-10-CM | POA: Diagnosis not present

## 2018-09-10 DIAGNOSIS — C44529 Squamous cell carcinoma of skin of other part of trunk: Secondary | ICD-10-CM | POA: Diagnosis not present

## 2018-09-10 DIAGNOSIS — L821 Other seborrheic keratosis: Secondary | ICD-10-CM | POA: Diagnosis not present

## 2018-09-27 DIAGNOSIS — C44619 Basal cell carcinoma of skin of left upper limb, including shoulder: Secondary | ICD-10-CM | POA: Diagnosis not present

## 2018-09-27 DIAGNOSIS — C44519 Basal cell carcinoma of skin of other part of trunk: Secondary | ICD-10-CM | POA: Diagnosis not present

## 2018-09-30 DIAGNOSIS — C4490 Unspecified malignant neoplasm of skin, unspecified: Secondary | ICD-10-CM | POA: Diagnosis not present

## 2018-09-30 DIAGNOSIS — L409 Psoriasis, unspecified: Secondary | ICD-10-CM | POA: Diagnosis not present

## 2018-09-30 DIAGNOSIS — Z111 Encounter for screening for respiratory tuberculosis: Secondary | ICD-10-CM | POA: Diagnosis not present

## 2018-10-01 ENCOUNTER — Telehealth: Payer: Self-pay | Admitting: Oncology

## 2018-10-01 ENCOUNTER — Other Ambulatory Visit: Payer: Self-pay | Admitting: Oncology

## 2018-10-01 DIAGNOSIS — C801 Malignant (primary) neoplasm, unspecified: Secondary | ICD-10-CM | POA: Insufficient documentation

## 2018-10-01 NOTE — Telephone Encounter (Signed)
A new patient appt has been scheduled for the pt to see Dr. Jana Hakim on 3/20 at 3pm. Mrs. Hasley has requested for her husband to see Dr. Jana Hakim because she's an established pt of his. I explained to the couple that Dr. Jana Hakim will be out of the office which is why his appt is later in the month. They both voiced understanding and wished to proceed with the appt. I also informed them that Dr. Jana Hakim will be ordering labs and scans and someone will be notifying them of the appts as well.

## 2018-10-03 ENCOUNTER — Telehealth: Payer: Self-pay | Admitting: Oncology

## 2018-10-03 NOTE — Telephone Encounter (Signed)
R/s appt per 3/04 sch message - pt is aware of appt date and time

## 2018-10-08 ENCOUNTER — Inpatient Hospital Stay: Payer: Medicare Other | Attending: Oncology

## 2018-10-08 ENCOUNTER — Other Ambulatory Visit: Payer: Self-pay | Admitting: *Deleted

## 2018-10-08 ENCOUNTER — Ambulatory Visit (HOSPITAL_COMMUNITY)
Admission: RE | Admit: 2018-10-08 | Discharge: 2018-10-08 | Disposition: A | Discharge status: Home / Self Care | Payer: Medicare Other | Source: Ambulatory Visit | Attending: Oncology | Admitting: Oncology

## 2018-10-08 DIAGNOSIS — I700 Atherosclerosis of aorta: Secondary | ICD-10-CM | POA: Insufficient documentation

## 2018-10-08 DIAGNOSIS — R772 Abnormality of alphafetoprotein: Secondary | ICD-10-CM | POA: Insufficient documentation

## 2018-10-08 DIAGNOSIS — E785 Hyperlipidemia, unspecified: Secondary | ICD-10-CM | POA: Diagnosis not present

## 2018-10-08 DIAGNOSIS — Z7982 Long term (current) use of aspirin: Secondary | ICD-10-CM | POA: Diagnosis not present

## 2018-10-08 DIAGNOSIS — Z79899 Other long term (current) drug therapy: Secondary | ICD-10-CM | POA: Diagnosis not present

## 2018-10-08 DIAGNOSIS — M129 Arthropathy, unspecified: Secondary | ICD-10-CM | POA: Insufficient documentation

## 2018-10-08 DIAGNOSIS — R011 Cardiac murmur, unspecified: Secondary | ICD-10-CM | POA: Diagnosis not present

## 2018-10-08 DIAGNOSIS — C801 Malignant (primary) neoplasm, unspecified: Secondary | ICD-10-CM | POA: Insufficient documentation

## 2018-10-08 DIAGNOSIS — K760 Fatty (change of) liver, not elsewhere classified: Secondary | ICD-10-CM | POA: Diagnosis not present

## 2018-10-08 DIAGNOSIS — D62 Acute posthemorrhagic anemia: Secondary | ICD-10-CM

## 2018-10-08 DIAGNOSIS — Z87891 Personal history of nicotine dependence: Secondary | ICD-10-CM | POA: Diagnosis not present

## 2018-10-08 DIAGNOSIS — Z8701 Personal history of pneumonia (recurrent): Secondary | ICD-10-CM | POA: Insufficient documentation

## 2018-10-08 DIAGNOSIS — J984 Other disorders of lung: Secondary | ICD-10-CM | POA: Diagnosis not present

## 2018-10-08 DIAGNOSIS — I10 Essential (primary) hypertension: Secondary | ICD-10-CM | POA: Insufficient documentation

## 2018-10-08 DIAGNOSIS — I2510 Atherosclerotic heart disease of native coronary artery without angina pectoris: Secondary | ICD-10-CM | POA: Diagnosis not present

## 2018-10-08 DIAGNOSIS — Z85828 Personal history of other malignant neoplasm of skin: Secondary | ICD-10-CM | POA: Diagnosis not present

## 2018-10-08 DIAGNOSIS — I447 Left bundle-branch block, unspecified: Secondary | ICD-10-CM | POA: Insufficient documentation

## 2018-10-08 DIAGNOSIS — R21 Rash and other nonspecific skin eruption: Secondary | ICD-10-CM | POA: Insufficient documentation

## 2018-10-08 DIAGNOSIS — Z809 Family history of malignant neoplasm, unspecified: Secondary | ICD-10-CM | POA: Diagnosis not present

## 2018-10-08 DIAGNOSIS — C439 Malignant melanoma of skin, unspecified: Secondary | ICD-10-CM | POA: Diagnosis not present

## 2018-10-08 LAB — CBC WITH DIFFERENTIAL/PLATELET
Abs Immature Granulocytes: 0.13 10*3/uL — ABNORMAL HIGH (ref 0.00–0.07)
Basophils Absolute: 0.1 10*3/uL (ref 0.0–0.1)
Basophils Relative: 1 %
EOS ABS: 0.3 10*3/uL (ref 0.0–0.5)
Eosinophils Relative: 3 %
HCT: 48.7 % (ref 39.0–52.0)
Hemoglobin: 15.7 g/dL (ref 13.0–17.0)
Immature Granulocytes: 1 %
Lymphocytes Relative: 17 %
Lymphs Abs: 1.7 10*3/uL (ref 0.7–4.0)
MCH: 34.6 pg — ABNORMAL HIGH (ref 26.0–34.0)
MCHC: 32.2 g/dL (ref 30.0–36.0)
MCV: 107.3 fL — AB (ref 80.0–100.0)
Monocytes Absolute: 1.8 10*3/uL — ABNORMAL HIGH (ref 0.1–1.0)
Monocytes Relative: 18 %
Neutro Abs: 5.7 10*3/uL (ref 1.7–7.7)
Neutrophils Relative %: 60 %
Platelets: 162 10*3/uL (ref 150–400)
RBC: 4.54 MIL/uL (ref 4.22–5.81)
RDW: 13.7 % (ref 11.5–15.5)
WBC: 9.7 10*3/uL (ref 4.0–10.5)
nRBC: 0 % (ref 0.0–0.2)

## 2018-10-08 LAB — CMP (CANCER CENTER ONLY)
ALT: 47 U/L — AB (ref 0–44)
AST: 65 U/L — ABNORMAL HIGH (ref 15–41)
Albumin: 3.5 g/dL (ref 3.5–5.0)
Alkaline Phosphatase: 96 U/L (ref 38–126)
Anion gap: 11 (ref 5–15)
BUN: 15 mg/dL (ref 8–23)
CO2: 26 mmol/L (ref 22–32)
CREATININE: 0.98 mg/dL (ref 0.61–1.24)
Calcium: 9.5 mg/dL (ref 8.9–10.3)
Chloride: 108 mmol/L (ref 98–111)
Glucose, Bld: 77 mg/dL (ref 70–99)
Potassium: 4.2 mmol/L (ref 3.5–5.1)
Sodium: 145 mmol/L (ref 135–145)
Total Bilirubin: 0.8 mg/dL (ref 0.3–1.2)
Total Protein: 7.5 g/dL (ref 6.5–8.1)

## 2018-10-08 LAB — CEA (IN HOUSE-CHCC): CEA (CHCC-In House): 3.92 ng/mL (ref 0.00–5.00)

## 2018-10-08 LAB — LACTATE DEHYDROGENASE: LDH: 297 U/L — ABNORMAL HIGH (ref 98–192)

## 2018-10-08 MED ORDER — IOHEXOL 300 MG/ML  SOLN
100.0000 mL | Freq: Once | INTRAMUSCULAR | Status: AC | PRN
  Administered 2018-10-08: 80 mL via INTRAVENOUS

## 2018-10-09 ENCOUNTER — Telehealth: Payer: Self-pay

## 2018-10-09 ENCOUNTER — Encounter: Payer: Self-pay | Admitting: Adult Health

## 2018-10-09 DIAGNOSIS — I700 Atherosclerosis of aorta: Secondary | ICD-10-CM | POA: Insufficient documentation

## 2018-10-09 LAB — AFP TUMOR MARKER: AFP, Serum, Tumor Marker: 12.5 ng/mL — ABNORMAL HIGH (ref 0.0–8.3)

## 2018-10-09 LAB — PROSTATE-SPECIFIC AG, SERUM (LABCORP): Prostate Specific Ag, Serum: 0.3 ng/mL (ref 0.0–4.0)

## 2018-10-09 NOTE — Telephone Encounter (Signed)
Spoke with patient to inform of no cancer seen on scans.  Scans do show aortic atherosclerosis and he should talk to his PCP about this.  Patient states "that runs in my family."  Understanding verbalized of information given.  No questions/concerns at this time.

## 2018-10-09 NOTE — Telephone Encounter (Signed)
-----   Message from Gardenia Phlegm, NP sent at 10/09/2018  9:10 AM EDT ----- Please call patient with results.  No cancer on scans.  He does have aortic atherosclerosis.  He should talk to his general practitioner about this.   ----- Message ----- From: Interface, Rad Results In Sent: 10/09/2018   8:27 AM EDT To: Chauncey Cruel, MD

## 2018-10-14 ENCOUNTER — Other Ambulatory Visit: Payer: Self-pay | Admitting: Oncology

## 2018-10-16 NOTE — Progress Notes (Signed)
[place]  Telephone:([phone]) [phone] Fax:(336) [phone]    ID: Paul Pineda DOB: 1945-12-05  MR#: [id number]  PFX#:[id number]  Patient Care Team: [name], MD as PCP - General (Family Medicine) [name], [name], MD as Consulting Physician (Hematology and Oncology) [name], MD as Referring Physician (Dermatology) [name], MD (Dermatology) OTHER MD:    CHIEF COMPLAINT: Occult carcinoma  CURRENT TREATMENT: Work-up in progress   HISTORY OF CURRENT ILLNESS: Paul Pineda is referred by Dr. [name] at [facility] for evaluation of possible occult carcinoma.  Paul Pineda underwent a skin punch biopsy on 08/14/2018 ([id number]) showing: A. Skin, punch biopsy, right upper chest, poorly differentiated carcinoma favor metastatic - Comment: after initial review of the H&E slides, immunohistochemical stains with appropriate controls were performed with EMA (negative), COX.2 (negative), CK-7 (negative, CD-20 (negative), CK-903 (positive), PAS (negative) and TTF-1 (negative) to confirm the  diagnosis.Clinicopathologic correlation remains necessary. B. Skin, excision, biopsy, right lower chest, Arteriovenous hemangioma/malformation.   Once we get the referral we obtained a chest, abdomen, and pelvis CT with contrast on 10/08/2018 showing no evidence of metastatic disease. Hepatic steatosis. Aortic atherosclerosis (ICD10-170.0). Coronary artery calcification.  Results for QUANTAVIS, OBRYANT (MRN [id number])  Ref. Range 10/08/2018 14:23 10/08/2018 14:25  AFP, Serum, Tumor Marker Latest Ref Range: 0.0 - 8.3 ng/mL 12.5 (H)   CEA ([facility]) Latest Ref Range: 0.00 - 5.00 ng/mL 3.92   Prostate Specific Ag, Serum Latest Ref Range: 0.0 - 4.0 ng/mL  0.3    The patient's subsequent history is as detailed below.   INTERVAL HISTORY: Paul Pineda was evaluated in the cancer clinic on 10/18/2018 accompanied by his wife.   On 07/26/2018, Paul Pineda presented to the ED for evaluation  of worsening psoriasis rash that developed into diffuse erythema with bilateral joint swelling and pain. He was prescribed prednisone, with relief that worked for two weeks, but then he broke out again.  He has a long history of severe psoriasis, which has been treated with immunosuppressants.  He is currently off treatment for this.  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   REVIEW OF SYSTEMS: Paul Pineda denies unusual headaches, visual changes, nausea, vomiting, stiff neck, dizziness, or gait imbalance. There has been no cough, phlegm production, or pleurisy, no chest pain or pressure, and no change in bowel or bladder habits. The patient denies fever, bleeding, unexplained fatigue or unexplained weight loss. A detailed review of systems was otherwise entirely negative.   PAST MEDICAL HISTORY: Past Medical History:  Diagnosis Date   Arthritis    Cancer ([place])    chest lump ,scrotum   Cataract    right   Heart murmur    Hyperlipidemia    Hypertension    Left bundle branch block    Pneumonia    hx   Cirrhosis   PAST SURGICAL HISTORY: Past Surgical History:  Procedure Laterality Date   CARDIAC CATHETERIZATION  10/11/2009   no sign CAD, EF 45-50%   INGUINAL HERNIA REPAIR Right 04/29/2013   Procedure: HERNIA REPAIR INGUINAL ADULT;  Surgeon: Ralene Ok, MD;  Location: [facility];  Service: General;  Laterality: Right;   INSERTION OF MESH Right 04/29/2013   Procedure: INSERTION OF MESH;  Surgeon: Ralene Ok, MD;  Location: [facility];  Service: General;  Laterality: Right;   lumps     scrotum, chest    TOTAL KNEE ARTHROPLASTY Right 07/14/2013   Procedure: RIGHT TOTAL KNEE ARTHROPLASTY;  Surgeon: Gearlean Alf,  MD;  Location: [facility];  Service: Orthopedics;  Laterality: Right;     FAMILY HISTORY: Family History  Problem Relation Age of Onset   Cancer Father        brain   Psoriasis Sister    Psoriasis Brother    Wynter's father died from brain  cancer at age 22. Patients' mother died from alzheimer's at age 42. The patient has 2 brothers and 1 sister. Patient denies anyone in her family having breast, ovarian, prostate, or pancreatic cancer. Paul Pineda notes that one of his brothers and his sister both also have psoriasis.    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 His wife, Paul Pineda, is a past patient here. They have two children. Paul Pineda and Paul Pineda, who both live in [place], [place]. 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 Almalik has no grandchildren. He does not attend a church, Glass blower/designer, or mosque.   ADVANCED DIRECTIVES: In the absence of any documentation, Paul Pineda's spouse, Paul Pineda, is his healthcare power of attorney.      HEALTH MAINTENANCE: Social History   Tobacco Use   Smoking status: Former Smoker    Packs/day: 2.00    Years: 10.00    Pack years: 20.00    Types: Cigarettes    Last attempt to quit: 04/04/1985    Years since quitting: 33.5   Smokeless tobacco: Never Used  Substance Use Topics   Alcohol use: Yes    Alcohol/week: 14.0 standard drinks    Types: 14 Cans of beer per week    Comment: 1-2 daily-burbon on rocks nightly   Drug use: No   Colonoscopy: [facility]; Pt believes last appointment was in 2019.   PSA 0.03 on 10/08/2018  No Known Allergies  Current Outpatient Medications  Medication Sig Dispense Refill   aspirin 81 MG tablet Take 81 mg by mouth daily.     docusate sodium 100 MG CAPS Take 100 mg by mouth 2 (two) times daily as needed for mild constipation. (Patient not taking: Reported on 07/26/2018) 60 capsule 0   Etanercept (ENBREL) 25 MG/0.5ML SOSY Inject 25 mg into the skin 2 (two) times a week. On Sunday and Thursday     hydrOXYzine (ATARAX/VISTARIL) 25 MG tablet Take 1 tablet (25 mg total) by mouth every 6 (six) hours as needed for itching. 12 tablet 0   lisinopril (PRINIVIL,ZESTRIL) 10 MG tablet Take 10 mg by mouth daily.      lisinopril (PRINIVIL,ZESTRIL) 5 MG tablet Take 1 tablet (5 mg total) by mouth daily. (Patient not taking: Reported on 07/26/2018) 30 tablet 3   metoprolol tartrate (LOPRESSOR) 25 MG tablet Take 1 tablet (25 mg total) by mouth 2 (two) times daily. (Patient taking differently: Take 25 mg by mouth daily. ) 60 tablet 3   Multiple Vitamins-Minerals (CENTRUM SILVER 50+MEN) TABS Take 1 tablet by mouth daily.     traMADol (ULTRAM) 50 MG tablet Take 1-2 tablets (50-100 mg total) by mouth every 6 (six) hours as needed (mild pain). (Patient not taking: Reported on 07/26/2018) 60 tablet 1   No current facility-administered medications for this visit.      OBJECTIVE: Middle-aged white man who appears stated age  73:   10/18/18 1504  BP: 119/68  Pulse: 78  Resp: 18  Temp: 97.8 F (36.6 C)  SpO2: 100%     Body mass index is 28.52 kg/m.   Wt Readings from Last 3 Encounters:  10/18/18 198  lb 12.8 oz (90.2 kg)  09/22/13 211 lb (95.7 kg)  08/16/13 208 lb 14.4 oz (94.8 kg)      ECOG FS:1 - Symptomatic but completely ambulatory  Ocular: Sclerae unicteric, pupils round and equal Lymphatic: No cervical or supraclavicular adenopathy Lungs no rales or rhonchi Heart regular rate and rhythm Abd soft, nontender, positive bowel sounds Scrotal exam shows bilateral testicular atrophy MSK no focal spinal tenderness, no joint edema, obvious osteoarthritic deformities in both hands Neuro: non-focal, well-oriented, appropriate affect Skin: The skin is diffusely erythematous.  There are no obvious plaques.  There are some areas of scarring from prior biopsies.  The area of concern in the right upper chest is imaged below.  The area where he believes the biopsy was obtained is not the white scar spot or the black eschar spot but is superior and medial to those 2 areas forming a triangle  10/18/2018 right chest      LAB RESULTS:  CMP     Component Value Date/Time   NA 145 10/08/2018 1438   K 4.2  10/08/2018 1438   CL 108 10/08/2018 1438   CO2 26 10/08/2018 1438   GLUCOSE 77 10/08/2018 1438   BUN 15 10/08/2018 1438   CREATININE 0.98 10/08/2018 1438   CALCIUM 9.5 10/08/2018 1438   PROT 7.5 10/08/2018 1438   ALBUMIN 3.5 10/08/2018 1438   AST 65 (H) 10/08/2018 1438   ALT 47 (H) 10/08/2018 1438   ALKPHOS 96 10/08/2018 1438   BILITOT 0.8 10/08/2018 1438   GFRNONAA >60 10/08/2018 1438   GFRAA >60 10/08/2018 1438    No results found for: TOTALPROTELP, ALBUMINELP, A1GS, A2GS, BETS, BETA2SER, GAMS, MSPIKE, SPEI  No results found for: KPAFRELGTCHN, LAMBDASER, KAPLAMBRATIO  Lab Results  Component Value Date   WBC 9.7 10/08/2018   NEUTROABS 5.7 10/08/2018   HGB 15.7 10/08/2018   HCT 48.7 10/08/2018   MCV 107.3 (H) 10/08/2018   PLT 162 10/08/2018    @LASTCHEMISTRY @  No results found for: LABCA2  No components found for: [id number]  No results for input(s): INR in the last 168 hours.  No results found for: LABCA2  No results found for: CAN199  No results found for: HGD924  No results found for: QAS341  No results found for: CA2729  No components found for: HGQUANT  Lab Results  Component Value Date   CEA1 3.92 10/08/2018   /  CEA ([facility])  Date Value Ref Range Status  10/08/2018 3.92 0.00 - 5.00 ng/mL Final    Comment:    (NOTE) This test was performed using Architect's Chemiluminescent Microparticle Immunoassay. Values obtained from different assay methods cannot be used interchangeably. Please note that 5-10% of patients who smoke may see CEA levels up to 6.9 ng/mL. Performed at [facility], [place] [address], [place], [place] [address]      Lab Results  Component Value Date   AFPTUMOR 12.5 (H) 10/08/2018    No results found for: [facility]  Lab Results  Component Value Date   PSA1 0.3 10/08/2018    No visits with results within 3 Day(s) from this visit.  Latest known visit with results is:  Appointment on  10/08/2018  Component Date Value Ref Range Status   CEA ([facility]) 10/08/2018 3.92  0.00 - 5.00 ng/mL Final   Comment: (NOTE) This test was performed using Architect's Chemiluminescent Microparticle Immunoassay. Values obtained from different assay methods cannot be used interchangeably. Please note that 5-10% of patients who  smoke may see CEA levels up to 6.9 ng/mL. Performed at [facility], [place] [address], [place], [place] [address]    AFP, Serum, Tumor Marker 10/08/2018 12.5* 0.0 - 8.3 ng/mL Final   Comment: (NOTE) Roche Diagnostics Electrochemiluminescence Immunoassay (ECLIA) Values obtained with different assay methods or kits cannot be used interchangeably.  Results cannot be interpreted as absolute evidence of the presence or absence of malignant disease. This test is not interpretable in pregnant females. Performed At: [facility] [address] [place], [place] [id number] Rush Farmer MD [id number]    LDH 10/08/2018 297* 98 - 192 U/L Final   Performed at [facility], [place] [address], [place], [place] [address]   WBC 10/08/2018 9.7  4.0 - 10.5 K/uL Final   RBC 10/08/2018 4.54  4.22 - 5.81 MIL/uL Final   Hemoglobin 10/08/2018 15.7  13.0 - 17.0 g/dL Final   HCT 10/08/2018 48.7  39.0 - 52.0 % Final   MCV 10/08/2018 107.3* 80.0 - 100.0 fL Final   MCH 10/08/2018 34.6* 26.0 - 34.0 pg Final   MCHC 10/08/2018 32.2  30.0 - 36.0 g/dL Final   RDW 10/08/2018 13.7  11.5 - 15.5 % Final   Platelets 10/08/2018 162  150 - 400 K/uL Final   nRBC 10/08/2018 0.0  0.0 - 0.2 % Final   Neutrophils Relative % 10/08/2018 60  % Final   Neutro Abs 10/08/2018 5.7  1.7 - 7.7 K/uL Final   Lymphocytes Relative 10/08/2018 17  % Final   Lymphs Abs 10/08/2018 1.7  0.7 - 4.0 K/uL Final   Monocytes Relative 10/08/2018 18  % Final   Monocytes Absolute 10/08/2018 1.8* 0.1 - 1.0 K/uL Final   Eosinophils Relative  10/08/2018 3  % Final   Eosinophils Absolute 10/08/2018 0.3  0.0 - 0.5 K/uL Final   Basophils Relative 10/08/2018 1  % Final   Basophils Absolute 10/08/2018 0.1  0.0 - 0.1 K/uL Final   Immature Granulocytes 10/08/2018 1  % Final   Abs Immature Granulocytes 10/08/2018 0.13* 0.00 - 0.07 K/uL Final   Performed at [facility], [place] [address], [place], [place] [address]   Prostate Specific Ag, Serum 10/08/2018 0.3  0.0 - 4.0 ng/mL Final   Comment: (NOTE) Roche ECLIA methodology. According to the American Urological Association, Serum PSA should decrease and remain at undetectable levels after radical prostatectomy. The AUA defines biochemical recurrence as an initial PSA value 0.2 ng/mL or greater followed by a subsequent confirmatory PSA value 0.2 ng/mL or greater. Values obtained with different assay methods or kits cannot be used interchangeably. Results cannot be interpreted as absolute evidence of the presence or absence of malignant disease. Performed At: [facility] [place], [place] [id number] Rush Farmer MD [id number]    Sodium 10/08/2018 145  135 - 145 mmol/L Final   Potassium 10/08/2018 4.2  3.5 - 5.1 mmol/L Final   Chloride 10/08/2018 108  98 - 111 mmol/L Final   CO2 10/08/2018 26  22 - 32 mmol/L Final   Glucose, Bld 10/08/2018 77  70 - 99 mg/dL Final   BUN 10/08/2018 15  8 - 23 mg/dL Final   Creatinine 10/08/2018 0.98  0.61 - 1.24 mg/dL Final   Calcium 10/08/2018 9.5  8.9 - 10.3 mg/dL Final   Total Protein 10/08/2018 7.5  6.5 - 8.1 g/dL Final   Albumin 10/08/2018 3.5  3.5 - 5.0 g/dL Final   AST 10/08/2018 65* 15 -  41 U/L Final   ALT 10/08/2018 47* 0 - 44 U/L Final   Alkaline Phosphatase 10/08/2018 96  38 - 126 U/L Final   Total Bilirubin 10/08/2018 0.8  0.3 - 1.2 mg/dL Final   GFR, Est Non Af Am 10/08/2018 >60  >60 mL/min Final   GFR, Est AFR Am 10/08/2018 >60  >60 mL/min Final   Anion gap  10/08/2018 11  5 - 15 Final   Performed at [facility], [place] [address] [place], [place] [address]    (this displays the last labs from the last 3 days)  No results found for: TOTALPROTELP, ALBUMINELP, A1GS, A2GS, BETS, BETA2SER, GAMS, MSPIKE, SPEI (this displays SPEP labs)  No results found for: KPAFRELGTCHN, LAMBDASER, KAPLAMBRATIO (kappa/lambda light chains)  No results found for: HGBA, HGBA2QUANT, HGBFQUANT, HGBSQUAN (Hemoglobinopathy evaluation)   Lab Results  Component Value Date   LDH 297 (H) 10/08/2018    No results found for: IRON, TIBC, IRONPCTSAT (Iron and TIBC)  No results found for: FERRITIN  Urinalysis    Component Value Date/Time   COLORURINE AMBER (A) 07/26/2018 1049   APPEARANCEUR HAZY (A) 07/26/2018 1049   LABSPEC 1.023 07/26/2018 1049   PHURINE 5.0 07/26/2018 1049   GLUCOSEU NEGATIVE 07/26/2018 1049   HGBUR NEGATIVE 07/26/2018 1049   BILIRUBINUR NEGATIVE 07/26/2018 1049   KETONESUR 5 (A) 07/26/2018 1049   PROTEINUR NEGATIVE 07/26/2018 1049   UROBILINOGEN 0.2 08/16/2013 0027   NITRITE NEGATIVE 07/26/2018 1049   LEUKOCYTESUR NEGATIVE 07/26/2018 1049     STUDIES:  Ct Chest W Contrast  Result Date: 10/09/2018 CLINICAL DATA:  Melanoma. EXAM: CT CHEST, ABDOMEN, AND PELVIS WITH CONTRAST TECHNIQUE: Multidetector CT imaging of the chest, abdomen and pelvis was performed following the standard protocol during bolus administration of intravenous contrast. CONTRAST:  87mL OMNIPAQUE IOHEXOL 300 MG/ML  SOLN COMPARISON:  CT abdomen pelvis 10/13/2005. FINDINGS: CT CHEST FINDINGS Cardiovascular: Atherosclerotic calcification of the aorta, aortic valve and coronary arteries. Heart size normal. No pericardial effusion. Mediastinum/Nodes: Calcified mediastinal and hilar lymph nodes. No pathologically enlarged mediastinal, hilar or axillary lymph nodes. Esophagus is grossly unremarkable. Lungs/Pleura: Mild subpleural scarring in the medial right  lower lobe. Lungs are otherwise clear. No pleural fluid. Airway is unremarkable. Musculoskeletal: No worrisome lytic or sclerotic lesions. CT ABDOMEN PELVIS FINDINGS Hepatobiliary: Liver is slightly decreased in attenuation diffusely. Liver and gallbladder are otherwise unremarkable. No biliary ductal dilatation. Pancreas: Negative. Spleen: Negative. Adrenals/Urinary Tract: Adrenal glands and right kidney are unremarkable. Subcentimeter low-attenuation lesion off the upper pole left kidney is too small to characterize but statistically, a cyst is most likely. Ureters are decompressed. Bladder is low in volume. Stomach/Bowel: Stomach, small bowel, appendix and colon are unremarkable. Vascular/Lymphatic: Atherosclerotic calcification of the aorta without aneurysm. No pathologically enlarged lymph nodes. Inguinal lymph nodes measure up to 11 mm on the right, stable. Reproductive: Prostate is visualized. Other: No free fluid.  Mesenteries and peritoneum are unremarkable. Musculoskeletal: No worrisome lytic or sclerotic lesions. Degenerative changes in the spine and sacroiliac joints. Slight retrolisthesis of L5 on S1. IMPRESSION: 1. No evidence of metastatic disease. 2. Hepatic steatosis. 3. Aortic atherosclerosis (ICD10-170.0). Coronary artery calcification. Electronically Signed   By: Lorin Picket M.D.   On: 10/09/2018 08:25   Ct Abdomen Pelvis W Contrast  Result Date: 10/09/2018 CLINICAL DATA:  Melanoma. EXAM: CT CHEST, ABDOMEN, AND PELVIS WITH CONTRAST TECHNIQUE: Multidetector CT imaging of the chest, abdomen and pelvis was performed following the standard protocol during bolus administration of intravenous contrast.  CONTRAST:  28mL OMNIPAQUE IOHEXOL 300 MG/ML  SOLN COMPARISON:  CT abdomen pelvis 10/13/2005. FINDINGS: CT CHEST FINDINGS Cardiovascular: Atherosclerotic calcification of the aorta, aortic valve and coronary arteries. Heart size normal. No pericardial effusion. Mediastinum/Nodes: Calcified  mediastinal and hilar lymph nodes. No pathologically enlarged mediastinal, hilar or axillary lymph nodes. Esophagus is grossly unremarkable. Lungs/Pleura: Mild subpleural scarring in the medial right lower lobe. Lungs are otherwise clear. No pleural fluid. Airway is unremarkable. Musculoskeletal: No worrisome lytic or sclerotic lesions. CT ABDOMEN PELVIS FINDINGS Hepatobiliary: Liver is slightly decreased in attenuation diffusely. Liver and gallbladder are otherwise unremarkable. No biliary ductal dilatation. Pancreas: Negative. Spleen: Negative. Adrenals/Urinary Tract: Adrenal glands and right kidney are unremarkable. Subcentimeter low-attenuation lesion off the upper pole left kidney is too small to characterize but statistically, a cyst is most likely. Ureters are decompressed. Bladder is low in volume. Stomach/Bowel: Stomach, small bowel, appendix and colon are unremarkable. Vascular/Lymphatic: Atherosclerotic calcification of the aorta without aneurysm. No pathologically enlarged lymph nodes. Inguinal lymph nodes measure up to 11 mm on the right, stable. Reproductive: Prostate is visualized. Other: No free fluid.  Mesenteries and peritoneum are unremarkable. Musculoskeletal: No worrisome lytic or sclerotic lesions. Degenerative changes in the spine and sacroiliac joints. Slight retrolisthesis of L5 on S1. IMPRESSION: 1. No evidence of metastatic disease. 2. Hepatic steatosis. 3. Aortic atherosclerosis (ICD10-170.0). Coronary artery calcification. Electronically Signed   By: Lorin Picket M.D.   On: 10/09/2018 08:25     ELIGIBLE FOR AVAILABLE RESEARCH PROTOCOL: no   ASSESSMENT: 73 y.o. Paul Pineda, [place] man with a history of prior basal cell carcinomas of the skin, now status post punch biopsy of a right upper chest skin lesion 08/14/2026 poorly differentiated carcinoma, which may be metastatic.  (a) CT scans of the chest abdomen and pelvis showed no obvious malignancy  (b) alpha-fetoprotein was mildly  elevated, CEA and PSA were in the normal range   PLAN: I spent approximately 60 minutes face to face with Thayer Jew with more than 50% of that time spent in counseling and coordination of care. Specifically we reviewed the biology of the patient's diagnosis and the specifics of his situation.  We reviewed the fact that there are more than 200 types of cancer, divided into 3 groups.  He does not have a sarcoma or white cell cancer.  They are of course many types of carcinoma and the specific morphology of the tumor noted on his skin biopsy does not give [place] a clue as to the cell of origin: It is "poorly differentiated".  Accordingly we obtained CT scans of the chest abdomen and pelvis which do not show an obvious primary.  They do show hepatic steatosis and that may be the reason for the elevation of his alpha-fetoprotein but I think we need better imaging of the liver and we will obtain a liver MRI to ascertain that.  If the liver MRI is normal, we will obtain a PET scan to search for an occult primary.  If the PET scan is also normal then I am going to conclude that we are dealing with a primary skin gland carcinoma.  We have to remember this is a person who has very active skin, with diffuse psoriasis, and has been immunocompromise secondary to treatment for several years.  In that case a follow-up would be chiefly through his dermatologist, but I would see him at least once more in August of this year just to make sure any remaining questions again answered.  Barack has a  good understanding of the overall plan.  He agrees with it.  He will call with any problems that may develop before his next visit here.    Justa Hatchell, [name], MD  10/18/18 4:07 PM Medical Oncology and Hematology [facility] [address] [place], [place] [address] Tel. [phone]    Fax. [phone]   I, Jacqualyn Posey am acting as a Education administrator for Chauncey Cruel, MD.   I, Lurline Del MD, have reviewed  the above documentation for accuracy and completeness, and I agree with the above.

## 2018-10-18 ENCOUNTER — Other Ambulatory Visit: Payer: Self-pay

## 2018-10-18 ENCOUNTER — Encounter: Payer: Self-pay | Admitting: Oncology

## 2018-10-18 ENCOUNTER — Inpatient Hospital Stay (HOSPITAL_BASED_OUTPATIENT_CLINIC_OR_DEPARTMENT_OTHER): Payer: Medicare Other | Admitting: Oncology

## 2018-10-18 VITALS — BP 119/68 | HR 78 | Temp 97.8°F | Resp 18 | Ht 70.0 in | Wt 198.8 lb

## 2018-10-18 DIAGNOSIS — K760 Fatty (change of) liver, not elsewhere classified: Secondary | ICD-10-CM | POA: Diagnosis not present

## 2018-10-18 DIAGNOSIS — Z87891 Personal history of nicotine dependence: Secondary | ICD-10-CM

## 2018-10-18 DIAGNOSIS — Z809 Family history of malignant neoplasm, unspecified: Secondary | ICD-10-CM

## 2018-10-18 DIAGNOSIS — Z85828 Personal history of other malignant neoplasm of skin: Secondary | ICD-10-CM

## 2018-10-18 DIAGNOSIS — I447 Left bundle-branch block, unspecified: Secondary | ICD-10-CM

## 2018-10-18 DIAGNOSIS — Z79899 Other long term (current) drug therapy: Secondary | ICD-10-CM

## 2018-10-18 DIAGNOSIS — M129 Arthropathy, unspecified: Secondary | ICD-10-CM

## 2018-10-18 DIAGNOSIS — E785 Hyperlipidemia, unspecified: Secondary | ICD-10-CM | POA: Diagnosis not present

## 2018-10-18 DIAGNOSIS — R011 Cardiac murmur, unspecified: Secondary | ICD-10-CM | POA: Diagnosis not present

## 2018-10-18 DIAGNOSIS — R772 Abnormality of alphafetoprotein: Secondary | ICD-10-CM

## 2018-10-18 DIAGNOSIS — C801 Malignant (primary) neoplasm, unspecified: Secondary | ICD-10-CM

## 2018-10-18 DIAGNOSIS — I2510 Atherosclerotic heart disease of native coronary artery without angina pectoris: Secondary | ICD-10-CM | POA: Diagnosis not present

## 2018-10-18 DIAGNOSIS — I10 Essential (primary) hypertension: Secondary | ICD-10-CM | POA: Diagnosis not present

## 2018-10-18 DIAGNOSIS — R21 Rash and other nonspecific skin eruption: Secondary | ICD-10-CM

## 2018-10-18 DIAGNOSIS — Z8701 Personal history of pneumonia (recurrent): Secondary | ICD-10-CM

## 2018-10-18 DIAGNOSIS — Z7982 Long term (current) use of aspirin: Secondary | ICD-10-CM

## 2018-10-18 DIAGNOSIS — I700 Atherosclerosis of aorta: Secondary | ICD-10-CM | POA: Diagnosis not present

## 2018-10-25 ENCOUNTER — Ambulatory Visit (HOSPITAL_COMMUNITY)
Admission: RE | Admit: 2018-10-25 | Discharge: 2018-10-25 | Disposition: A | Discharge status: Home / Self Care | Payer: Medicare Other | Source: Ambulatory Visit | Attending: Oncology | Admitting: Oncology

## 2018-10-25 ENCOUNTER — Other Ambulatory Visit: Payer: Self-pay

## 2018-10-25 ENCOUNTER — Other Ambulatory Visit: Payer: Self-pay | Admitting: Oncology

## 2018-10-25 DIAGNOSIS — C801 Malignant (primary) neoplasm, unspecified: Secondary | ICD-10-CM | POA: Insufficient documentation

## 2018-10-25 DIAGNOSIS — R772 Abnormality of alphafetoprotein: Secondary | ICD-10-CM | POA: Diagnosis not present

## 2018-10-25 DIAGNOSIS — R7989 Other specified abnormal findings of blood chemistry: Secondary | ICD-10-CM | POA: Diagnosis not present

## 2018-10-25 MED ORDER — GADOBUTROL 1 MMOL/ML IV SOLN
10.0000 mL | Freq: Once | INTRAVENOUS | Status: AC | PRN
  Administered 2018-10-25: 9 mL via INTRAVENOUS

## 2018-10-25 NOTE — Progress Notes (Signed)
I called Paul Pineda and gave him the results of his MRI of the liver which are fine.

## 2018-10-29 ENCOUNTER — Telehealth: Payer: Self-pay | Admitting: *Deleted

## 2018-12-22 ENCOUNTER — Other Ambulatory Visit: Payer: Self-pay

## 2018-12-22 ENCOUNTER — Emergency Department (HOSPITAL_COMMUNITY): Payer: Medicare Other

## 2018-12-22 ENCOUNTER — Encounter (HOSPITAL_COMMUNITY): Payer: Self-pay

## 2018-12-22 ENCOUNTER — Emergency Department (HOSPITAL_COMMUNITY)
Admission: EM | Admit: 2018-12-22 | Discharge: 2018-12-23 | Disposition: A | Discharge status: Home / Self Care | Payer: Medicare Other | Attending: Emergency Medicine | Admitting: Emergency Medicine

## 2018-12-22 DIAGNOSIS — S8991XA Unspecified injury of right lower leg, initial encounter: Secondary | ICD-10-CM | POA: Diagnosis not present

## 2018-12-22 DIAGNOSIS — I10 Essential (primary) hypertension: Secondary | ICD-10-CM | POA: Diagnosis not present

## 2018-12-22 DIAGNOSIS — S8992XA Unspecified injury of left lower leg, initial encounter: Secondary | ICD-10-CM | POA: Diagnosis not present

## 2018-12-22 DIAGNOSIS — E1165 Type 2 diabetes mellitus with hyperglycemia: Secondary | ICD-10-CM | POA: Diagnosis not present

## 2018-12-22 DIAGNOSIS — M25561 Pain in right knee: Secondary | ICD-10-CM | POA: Diagnosis not present

## 2018-12-22 DIAGNOSIS — Z87891 Personal history of nicotine dependence: Secondary | ICD-10-CM | POA: Diagnosis not present

## 2018-12-22 DIAGNOSIS — R51 Headache: Secondary | ICD-10-CM | POA: Diagnosis not present

## 2018-12-22 DIAGNOSIS — S0990XA Unspecified injury of head, initial encounter: Secondary | ICD-10-CM | POA: Diagnosis not present

## 2018-12-22 DIAGNOSIS — W19XXXA Unspecified fall, initial encounter: Secondary | ICD-10-CM

## 2018-12-22 DIAGNOSIS — R531 Weakness: Secondary | ICD-10-CM | POA: Diagnosis not present

## 2018-12-22 DIAGNOSIS — M25562 Pain in left knee: Secondary | ICD-10-CM | POA: Diagnosis not present

## 2018-12-22 DIAGNOSIS — Z79899 Other long term (current) drug therapy: Secondary | ICD-10-CM | POA: Insufficient documentation

## 2018-12-22 LAB — COMPREHENSIVE METABOLIC PANEL
ALT: 33 U/L (ref 0–44)
AST: 44 U/L — ABNORMAL HIGH (ref 15–41)
Albumin: 3 g/dL — ABNORMAL LOW (ref 3.5–5.0)
Alkaline Phosphatase: 93 U/L (ref 38–126)
Anion gap: 8 (ref 5–15)
BUN: 32 mg/dL — ABNORMAL HIGH (ref 8–23)
CO2: 25 mmol/L (ref 22–32)
Calcium: 8.4 mg/dL — ABNORMAL LOW (ref 8.9–10.3)
Chloride: 105 mmol/L (ref 98–111)
Creatinine, Ser: 1.2 mg/dL (ref 0.61–1.24)
GFR calc Af Amer: >60 mL/min (ref >60)
GFR calc non Af Amer: >60 mL/min (ref >60)
Glucose, Bld: 101 mg/dL — ABNORMAL HIGH (ref 70–99)
Potassium: 4.8 mmol/L (ref 3.5–5.1)
Sodium: 138 mmol/L (ref 135–145)
Total Bilirubin: 1.5 mg/dL — ABNORMAL HIGH (ref 0.3–1.2)
Total Protein: 6.6 g/dL (ref 6.5–8.1)

## 2018-12-22 LAB — CBC WITH DIFFERENTIAL/PLATELET
Abs Immature Granulocytes: 0.07 10*3/uL (ref 0.00–0.07)
Basophils Absolute: 0.1 10*3/uL (ref 0.0–0.1)
Basophils Relative: 1 %
Eosinophils Absolute: 0.1 10*3/uL (ref 0.0–0.5)
Eosinophils Relative: 1 %
HCT: 43.9 % (ref 39.0–52.0)
Hemoglobin: 14.1 g/dL (ref 13.0–17.0)
Immature Granulocytes: 1 %
Lymphocytes Relative: 26 %
Lymphs Abs: 2.8 10*3/uL (ref 0.7–4.0)
MCH: 33.4 pg (ref 26.0–34.0)
MCHC: 32.1 g/dL (ref 30.0–36.0)
MCV: 104 fL — ABNORMAL HIGH (ref 80.0–100.0)
Monocytes Absolute: 1.5 10*3/uL — ABNORMAL HIGH (ref 0.1–1.0)
Monocytes Relative: 14 %
Neutro Abs: 6.2 10*3/uL (ref 1.7–7.7)
Neutrophils Relative %: 57 %
Platelets: 227 10*3/uL (ref 150–400)
RBC: 4.22 MIL/uL (ref 4.22–5.81)
RDW: 13.6 % (ref 11.5–15.5)
WBC Morphology: ABNORMAL
WBC: 10.7 10*3/uL — ABNORMAL HIGH (ref 4.0–10.5)
nRBC: 0 % (ref 0.0–0.2)

## 2018-12-22 LAB — URINALYSIS, ROUTINE W REFLEX MICROSCOPIC
Bacteria, UA: NONE SEEN
Bilirubin Urine: NEGATIVE
Glucose, UA: NEGATIVE mg/dL
Hgb urine dipstick: NEGATIVE
Ketones, ur: NEGATIVE mg/dL
Nitrite: NEGATIVE
Protein, ur: NEGATIVE mg/dL
Specific Gravity, Urine: 1.021 (ref 1.005–1.030)
pH: 5 (ref 5.0–8.0)

## 2018-12-22 MED ORDER — LORAZEPAM 2 MG/ML IJ SOLN: 0.0000 mg | Freq: Four times a day (QID) | INTRAMUSCULAR | Status: DC

## 2018-12-22 MED ORDER — LISINOPRIL 10 MG PO TABS
10.0000 mg | ORAL_TABLET | Freq: Every day | ORAL | Status: DC
  Administered 2018-12-22 – 2018-12-23 (×2): 10 mg via ORAL
  Filled 2018-12-22 (×2): qty 1

## 2018-12-22 MED ORDER — VITAMIN B-1 100 MG PO TABS
100.0000 mg | ORAL_TABLET | Freq: Every day | ORAL | Status: DC
  Administered 2018-12-23: 100 mg via ORAL
  Filled 2018-12-22: qty 1

## 2018-12-22 MED ORDER — LORAZEPAM 1 MG PO TABS
1.0000 mg | ORAL_TABLET | Freq: Once | ORAL | Status: AC
  Administered 2018-12-22: 1 mg via ORAL
  Filled 2018-12-22: qty 1

## 2018-12-22 MED ORDER — LORAZEPAM 0.5 MG PO TABS
0.5000 mg | ORAL_TABLET | Freq: Once | ORAL | Status: AC
  Administered 2018-12-22: 0.5 mg via ORAL
  Filled 2018-12-22: qty 1

## 2018-12-22 MED ORDER — LORAZEPAM 1 MG PO TABS: 0.0000 mg | ORAL_TABLET | Freq: Two times a day (BID) | ORAL | Status: DC

## 2018-12-22 MED ORDER — PREDNISONE 20 MG PO TABS
40.0000 mg | ORAL_TABLET | Freq: Every day | ORAL | Status: DC
  Administered 2018-12-23: 40 mg via ORAL
  Filled 2018-12-22: qty 2

## 2018-12-22 MED ORDER — LORAZEPAM 2 MG/ML IJ SOLN: 0.0000 mg | Freq: Two times a day (BID) | INTRAMUSCULAR | Status: DC

## 2018-12-22 MED ORDER — LORAZEPAM 2 MG/ML IJ SOLN
0.5000 mg | Freq: Once | INTRAMUSCULAR | Status: DC
  Filled 2018-12-22: qty 1

## 2018-12-22 MED ORDER — SODIUM CHLORIDE 0.9 % IV BOLUS
500.0000 mL | Freq: Once | INTRAVENOUS | Status: AC
  Administered 2018-12-22: 500 mL via INTRAVENOUS

## 2018-12-22 MED ORDER — LORAZEPAM 1 MG PO TABS: 0.0000 mg | ORAL_TABLET | Freq: Four times a day (QID) | ORAL | Status: DC

## 2018-12-22 MED ORDER — LORAZEPAM 0.5 MG PO TABS
0.5000 mg | ORAL_TABLET | Freq: Once | ORAL | Status: DC
  Filled 2018-12-22: qty 1

## 2018-12-22 MED ORDER — THIAMINE HCL 100 MG/ML IJ SOLN: 100.0000 mg | Freq: Every day | INTRAMUSCULAR | Status: DC

## 2018-12-22 MED ORDER — LORAZEPAM 1 MG PO TABS: 1.0000 mg | ORAL_TABLET | Freq: Once | ORAL | Status: DC

## 2018-12-22 MED ORDER — METOPROLOL TARTRATE 25 MG PO TABS
25.0000 mg | ORAL_TABLET | Freq: Two times a day (BID) | ORAL | Status: DC
  Administered 2018-12-22 – 2018-12-23 (×3): 25 mg via ORAL
  Filled 2018-12-22 (×3): qty 1

## 2018-12-22 MED ORDER — PREDNISONE 20 MG PO TABS: 40.0000 mg | ORAL_TABLET | Freq: Every day | ORAL | Status: DC

## 2018-12-22 NOTE — Progress Notes (Signed)
Received Walker from the main ED via stretcher. He was able to move himself from the stretcher to the bed. He stated multiple falls within the last week related to his knees giving out. He was oriented to his environment and made comfortable in bed. He was compliant with his medication and slept throughout the night.

## 2018-12-22 NOTE — ED Notes (Signed)
Bed: VG71 Expected date:  Expected time:  Means of arrival:  Comments: 73 yo weakness, falls

## 2018-12-22 NOTE — ED Provider Notes (Signed)
[facility] DEPT Provider Note   CSN: [id number] Arrival date & time: 12/22/18  1207    History   Chief Complaint No chief complaint on file.   HPI ILIAN Pineda is a 73 y.o. male.     HPI States he is had to 3 days of increased generalized weakness.  6 days ago patient was walking and states his left knee gave out.  He fell landing on the left knee.  Complaining of persistent bilateral knee pain.  States exact decrease oral intake over the last few days.  Recently seen and started back on prednisone for his diffuse psoriasis.  Denies definite head injury.  No fever or chills.  Patient states he has thin skin and when he bumps against things he sustains skin tears.  Has one on his left upper arm.  Denies chest pain, shortness of breath or cough. Past Medical History:  Diagnosis Date  . Arthritis   . Cancer (HCC)    chest lump ,scrotum  . Cataract    right  . Heart murmur   . Hyperlipidemia   . Hypertension   . Left bundle branch block   . Pneumonia    hx    Patient Active Problem List   Diagnosis Date Noted  . Aortic atherosclerosis ([place]) 10/09/2018  . 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 Syncope and collapse 08/15/2013  . Tachycardia 08/15/2013  . Dehydration 08/15/2013  . Postoperative anemia due to acute blood loss 07/15/2013  . OA (osteoarthritis) of knee 07/14/2013    Past Surgical History:  Procedure Laterality Date  . CARDIAC CATHETERIZATION  10/11/2009   no sign CAD, EF 45-50%  . INGUINAL HERNIA REPAIR Right 04/29/2013   Procedure: HERNIA REPAIR INGUINAL ADULT;  Surgeon: Ralene Ok, MD;  Location: [facility];  Service: General;  Laterality: Right;  . INSERTION OF MESH Right 04/29/2013   Procedure: INSERTION OF MESH;  Surgeon: Ralene Ok, MD;  Location: [facility];  Service: General;  Laterality: Right;  . lumps     scrotum, chest   . TOTAL KNEE ARTHROPLASTY Right 07/14/2013   Procedure: RIGHT TOTAL KNEE ARTHROPLASTY;   Surgeon: Gearlean Alf, MD;  Location: [facility];  Service: Orthopedics;  Laterality: Right;        Home Medications    Prior to Admission medications   Medication Sig Start Date End Date Taking? Authorizing Provider  lisinopril (PRINIVIL,ZESTRIL) 10 MG tablet Take 10 mg by mouth daily.   Yes [provider]  methylPREDNISolone (MEDROL DOSEPAK) 4 MG TBPK tablet Take 4 mg by mouth See admin instructions. UAD on package 12/21/18  Yes [provider]  metoprolol tartrate (LOPRESSOR) 25 MG tablet Take 1 tablet (25 mg total) by mouth 2 (two) times daily. 08/17/13  Yes Rai, Ripudeep K, MD  Multiple Vitamins-Minerals (CENTRUM SILVER 50+MEN) TABS Take 1 tablet by mouth daily.   Yes [provider]  docusate sodium 100 MG CAPS Take 100 mg by mouth 2 (two) times daily as needed for mild constipation. Patient not taking: Reported on 07/26/2018 08/16/13   Mendel Corning, MD  hydrOXYzine (ATARAX/VISTARIL) 25 MG tablet Take 1 tablet (25 mg total) by mouth every 6 (six) hours as needed for itching. Patient not taking: Reported on 12/22/2018 07/26/18   Long, Wonda Olds, MD  lisinopril (PRINIVIL,ZESTRIL) 5 MG tablet Take 1 tablet (5 mg total) by mouth daily. Patient not taking: Reported on 07/26/2018 08/17/13   Mendel Corning, MD  traMADol (ULTRAM) 50 MG  tablet Take 1-2 tablets (50-100 mg total) by mouth every 6 (six) hours as needed (mild pain). Patient not taking: Reported on 07/26/2018 07/16/13   Joelene Millin, PA-C    Family History Family History  Problem Relation Age of Onset  . Cancer Father        brain  . Psoriasis Sister   . Psoriasis Brother     Social History Social History   Tobacco Use  . Smoking status: Former Smoker    Packs/day: 2.00    Years: 10.00    Pack years: 20.00    Types: Cigarettes    Last attempt to quit: 04/04/1985    Years since quitting: 33.7  . Smokeless tobacco: Never Used  Substance Use Topics  . Alcohol use: Yes     Alcohol/week: 14.0 standard drinks    Types: 14 Cans of beer per week    Comment: 1-2 daily-burbon on rocks nightly  . Drug use: No     Allergies   Patient has no known allergies.   Review of Systems Review of Systems  Constitutional: Positive for appetite change and fatigue. Negative for chills and fever.  HENT: Negative for sore throat and trouble swallowing.   Eyes: Negative for visual disturbance.  Respiratory: Negative for cough and shortness of breath.   Cardiovascular: Negative for chest pain, palpitations and leg swelling.  Gastrointestinal: Negative for abdominal pain, constipation, diarrhea, nausea and vomiting.  Genitourinary: Negative for dysuria, flank pain and frequency.  Musculoskeletal: Positive for arthralgias and joint swelling. Negative for myalgias, neck pain and neck stiffness.  Skin: Positive for rash and wound.  Neurological: Positive for weakness. Negative for dizziness, light-headedness, numbness and headaches.  All other systems reviewed and are negative.    Physical Exam Updated Vital Signs BP 138/86 (BP Location: Right Arm)   Pulse (!) 107   Temp 97.8 F (36.6 C) (Oral)   Resp 20   Ht 5\' 10"  (1.778 m)   Wt 98.4 kg   SpO2 99%   BMI 31.14 kg/m   Physical Exam Vitals signs and nursing note reviewed.  Constitutional:      Appearance: Normal appearance. He is well-developed.  HENT:     Head: Normocephalic and atraumatic.     Comments: No obvious scalp trauma.  Dry mucous membranes.  Cranial nerves II through XII intact.    Nose: Nose normal.     Mouth/Throat:     Mouth: Mucous membranes are dry.  Eyes:     Extraocular Movements: Extraocular movements intact.     Pupils: Pupils are equal, round, and reactive to light.  Neck:     Musculoskeletal: Normal range of motion and neck supple. No neck rigidity or muscular tenderness.     Vascular: No carotid bruit.     Comments: No meningismus Cardiovascular:     Rate and Rhythm: Normal rate and  regular rhythm.     Heart sounds: No murmur. No friction rub. No gallop.   Pulmonary:     Effort: Pulmonary effort is normal. No respiratory distress.     Breath sounds: Normal breath sounds. No stridor. No wheezing, rhonchi or rales.  Chest:     Chest wall: No tenderness.  Abdominal:     General: Bowel sounds are normal.     Palpations: Abdomen is soft.     Tenderness: There is no abdominal tenderness. There is no guarding or rebound.  Musculoskeletal: Normal range of motion.        General: No tenderness.  Comments: Pelvis is stable.  Mild bilateral knee swelling with minimal pain with range of motion.  No ligamentous instability.  Distal pulses intact.  Lymphadenopathy:     Cervical: No cervical adenopathy.  Skin:    General: Skin is warm and dry.     Findings: No erythema or rash.  Neurological:     Mental Status: He is alert and oriented to person, place, and time.     Comments: 4/5 motor in bilateral lower extremities especially at the hip flexors especially with hip flexion.  5/5 motor in plantar and dorsiflexion.  5/5 motor bilateral upper extremities.  Patient does have a mild tremor in both hands.  Sensation is grossly intact.  Psychiatric:        Behavior: Behavior normal.      ED Treatments / Results  Labs (all labs ordered are listed, but only abnormal results are displayed) Labs Reviewed  CBC WITH DIFFERENTIAL/PLATELET - Abnormal; Notable for the following components:      Result Value   WBC 10.7 (*)    MCV 104.0 (*)    Monocytes Absolute 1.5 (*)    All other components within normal limits  COMPREHENSIVE METABOLIC PANEL - Abnormal; Notable for the following components:   Glucose, Bld 101 (*)    BUN 32 (*)    Calcium 8.4 (*)    Albumin 3.0 (*)    AST 44 (*)    Total Bilirubin 1.5 (*)    All other components within normal limits  URINALYSIS, ROUTINE W REFLEX MICROSCOPIC - Abnormal; Notable for the following components:   Color, Urine AMBER (*)     APPearance HAZY (*)    Leukocytes,Ua TRACE (*)    All other components within normal limits    EKG EKG Interpretation  Date/Time:  Sunday Dec 22 2018 12:25:55 EDT Ventricular Rate:  79 PR Interval:    QRS Duration: 101 QT Interval:  352 QTC Calculation: 404 R Axis:   53 Text Interpretation:  Sinus rhythm Supraventricular bigeminy Sinus pause Probable left atrial enlargement Probable anteroseptal infarct, old Confirmed by Cecylia Brazill ([id number]) on 12/22/2018 2:22:26 PM   Radiology No results found.  Procedures Procedures (including critical care time)  Medications Ordered in ED Medications  sodium chloride 0.9 % bolus 500 mL (0 mLs Intravenous Stopped 12/22/18 1419)  LORazepam (ATIVAN) tablet 0.5 mg (0.5 mg Oral Given 12/22/18 1451)  LORazepam (ATIVAN) tablet 1 mg (1 mg Oral Given 12/22/18 1845)  ibuprofen (ADVIL) tablet 600 mg (600 mg Oral Given 12/23/18 1128)  cyclobenzaprine (FLEXERIL) tablet 5 mg (5 mg Oral Given 12/23/18 1128)     Initial Impression / Assessment and Plan / ED Course  I have reviewed the triage vital signs and the nursing notes.  Pertinent labs & imaging results that were available during my care of the patient were reviewed by me and considered in my medical decision making (see chart for details).       Family called and concern for alcohol abuse and possible withdrawal symptoms.  Patient with generalized weakness and question mild withdrawal symptoms.  No acute findings during work-up in the emergency department.  Discussed with social work who will review case and help with discharge planning. Final Clinical Impressions(s) / ED Diagnoses   Final diagnoses:  Fall, initial encounter    ED Discharge Orders         Ordered    Home Health     05 /25/20 0814    Face-to-face encounter (required for Medicare/Medicaid patients)  Comments:  I Jola Schmidt certify that this patient is under my care and that I, or a nurse practitioner or physician's  assistant working with me, had a face-to-face encounter that meets the physician face-to-face encounter requirements with this patient on 12/23/2018. The encounter with the patient was in whole, or in part for the following medical condition(s) which is the primary reason for home health care (List medical condition): increased generalized weakness   12/23/18 0814    DME Bedside commode     12/23/18 0905           Julianne Rice, MD 12/24/18 2029

## 2018-12-22 NOTE — Progress Notes (Addendum)
CSW met with pt who stated he has experience difficulties with ambulating around his home mainly due to the fact that "my home is not set up to get around easily and because of this I can't use a wheelchair".  Pt continued, "my home is difficult to get around in and the way my bathroom is set up it takes me an hour to get in and out and my wife has to help me".  Pt states whenever he has to use the bathroom his wife has to help him and this makes it difficult on her.  Pt stated he thinks he may benefit from PT/OT and is agreeable to [facility] services and is agreeable to D/C'ing home with the [facility] RN CM contacting pt on 5/25 to follow up with him about choosing a [facility] agency to meet with him and assess the pt on either Monday 5/24, or Tuesday 5/25 for [facility] services.  CSW asked pt if pt would like a [facility] agency equipped with social work services so pt can be monitored for a need should it arise for pt to begin looking at SNF placement from the home with the assistance of [facility] social work and pt was agreeable to that saying, "I want to keep my options open as I want to be able to get better soon to give my wife a rest.  EPD stated EPD will place a consult for CM with an order for face-to-face and an order for medical equipment and RN/Aide/PT/OT and Social Work so that the [facility] CM can follow up with the pt at home on 5/25 to initiate [facility] services.  Pt stated that this as well as additional water would all that he needs at this time.  2nd shift ED CSW will leave handoff for 1st shift TOC RN CM.  EDP/RN updated.  Please reconsult if future social work needs arise.  CSW signing off, as social work intervention is no longer needed.  Alphonse Guild. Thomson Herbers, LCSW, LCAS, CSI Transitions of Care Clinical Social Worker Care Coordination Department Ph: [phone]

## 2018-12-22 NOTE — ED Notes (Signed)
Patient given [place] sandwhich and water. Patient was given his home medication methylprednisolone per family request and m.d ok it to be given in ed.

## 2018-12-22 NOTE — Progress Notes (Signed)
Zacarias Pontes CSW received phone call from [facility] ED Secretary about patient's family not wanting to pick patient up. CSW contacted family and noted their concerns with being able to take care of patient tonight and being concerned of him being discharged this quickly. CSW explained patient's discharge plan and patient had agreed to the plan and that he was medically cleared. CSW noted they had multiple questions about patient's lab tests, medical results. CSW advised them he is not medical and cannot interpret results. CSW noted they wanted to speak with attending. CSW contacted attending and provided an update. CSW called to inform them that attending will call them.  Lamonte Richer, LCSW, [place] Worker II [phone]

## 2018-12-22 NOTE — ED Notes (Signed)
Paul Pineda patient daughter phone # for update [phone]

## 2018-12-22 NOTE — ED Provider Notes (Signed)
I was notified by Social Work of patient family not being willing to take patient home.   I had a long discussion with pt's wife and his daughter via telephone. They have significant concerns about pt's safety and his high falls risk, as well as the fact that pt will not be able to get HH/PT until tomorrow at the earliest.   I reviewed his labs, imaging, and examined pt. He appears stable clinically though he does have some mild tremors which I suspect are 2/2 mild EtOH w/d. He is also hypertensive but has not had his meds.   Will start on his home meds, CIWA, and have SW/CM attempt to arrange more secure home health for him in the AM so that he is safe for d/c.    Duffy Bruce, MD 12/23/18 1011

## 2018-12-22 NOTE — ED Triage Notes (Signed)
Patient presented to ed with c/o incrase generalize  weakness and fall. Patient have full body psoriasis.

## 2018-12-22 NOTE — Progress Notes (Signed)
Consult request has been received. CSW attempting to follow up at present time.  Per the EPD,pt has been experiencing ncreasing difficulty getting up and moving around per the pt's family, as well as indications of alcohol use which may or may not have been involved in pt's fall.  Per EPD, pt demonstrating no acute injuries or conditions warranting admission.  Per CSW's request, EPD who will place a consult for CM with an order for face-to-face and RN/Aide/PT/OT and Social Work in case pt/pt's family desires assistance by [facility], and CSW will speak to pt/pt's family to determine family's wishes  CSW will continue to follow for D/C needs.  Paul Pineda. Jotham Ahn, LCSW, LCAS, CSI Transitions of Care Clinical Social Worker Care Coordination Department Ph: [phone]      .

## 2018-12-23 DIAGNOSIS — Z7401 Bed confinement status: Secondary | ICD-10-CM | POA: Diagnosis not present

## 2018-12-23 DIAGNOSIS — M2550 Pain in unspecified joint: Secondary | ICD-10-CM | POA: Diagnosis not present

## 2018-12-23 MED ORDER — IBUPROFEN 200 MG PO TABS
600.0000 mg | ORAL_TABLET | Freq: Once | ORAL | Status: AC
  Administered 2018-12-23: 11:00:00 600 mg via ORAL
  Filled 2018-12-23: qty 3

## 2018-12-23 MED ORDER — CYCLOBENZAPRINE HCL 10 MG PO TABS
5.0000 mg | ORAL_TABLET | Freq: Once | ORAL | Status: AC
  Administered 2018-12-23: 5 mg via ORAL
  Filled 2018-12-23: qty 1

## 2018-12-23 NOTE — Progress Notes (Addendum)
10:00a - CSW spoke with Glyn Ade with [facility] who reports she spoke with patient's family and assured them that home health will be out tomorrow to see patient and DME will be delivered tomorrow. CSW called family to reassure them that home health will be out tomorrow and all needs have been met here in the ED for patient to be comfortable this evening. CSW spoke with Dr. Venora Maples who also reached out the family and assured them that patient is cleared medically and good to go home with home health.   9:08 - CSW spoke with [facility] RN CM. Patient's family is refusing to pick patient up and wants him to go a facility. CSW asked by RN CM to have EDP put in a consult for PT to determine if patient has a skillable need.   CSW spoke with nurse about patient being discharged. She reports at the time of our conversation EDP was speaking with patient. Nurse reports EDP will contact patient's family about picking up patient up from the ED. Per note, RN CM spoke with patient about home health this morning. TOC CSW available for any other SW needs.   [place], LCSW Transitions of Care Department [facility] ED [phone]

## 2018-12-23 NOTE — Evaluation (Signed)
Physical Therapy Evaluation Patient Details Name: Paul Pineda MRN: [id number] DOB: 02-08-1946 Today's Date: 12/23/2018   History of Present Illness   73 yo male presented to ed with c/o incrase generalize  weakness and fall. Patient have full body psoriasis. PMHx: R TKA, knee pain   Clinical Impression  Pt admitted with above diagnosis. Pt currently with functional limitations due to the deficits listed below (see PT Problem List).  See below for mobility details, pt will benefit from continued PT, recommend  HHPT/HHOT, RW and 3in1; would benefit from supervision for mobility/safety at home.  pt states that "they would have to kill me before they send me to any rehab"; pt is mobilizing near his baseline, states mostly back pain is limiting him at this time; he also amb to bathroom and took a shower earlier this am with NT.   Pt will benefit from skilled PT to increase their independence and safety with mobility to allow discharge to the venue listed below.       Follow Up Recommendations Home health PT    Equipment Recommendations  Rolling walker with 5" wheels;3in1 (PT)    Recommendations for Other Services       Precautions / Restrictions Precautions Precautions: Fall Restrictions Weight Bearing Restrictions: No      Mobility  Bed Mobility Overal bed mobility: Needs Assistance Bed Mobility: Supine to Sit;Sit to Supine     Supine to sit: Supervision Sit to supine: Min assist   General bed mobility comments: bed flat, supervision to come to sit, no physical assist but incr time needed; min assist to lift RLE onto bed  Transfers Overall transfer level: Needs assistance Equipment used: Rolling walker (2 wheeled) Transfers: Sit to/from Stand Sit to Stand: Supervision;Min guard         General transfer comment: for safety, cues for hand placement  Ambulation/Gait Ambulation/Gait assistance: Supervision;Min guard Gait Distance (Feet): 60 Feet Assistive device:  Rolling walker (2 wheeled) Gait Pattern/deviations: Step-through pattern;Decreased stride length;Trunk flexed     General Gait Details: cues for RW position from self, trunk flexed d/t back pain, reliant on UEs, no LOB over level, smooth surface  Stairs            Wheelchair Mobility    Modified Rankin (Stroke Patients Only)       Balance Overall balance assessment: Needs assistance;History of Falls(pt reports repeated recent falls in bathroom) Sitting-balance support: Feet supported;No upper extremity supported Sitting balance-Leahy Scale: Good       Standing balance-Leahy Scale: Poor Standing balance comment: reliant on UEs                              Pertinent Vitals/Pain Pain Assessment: Faces Faces Pain Scale: Hurts little more Pain Location: back with activity Pain Descriptors / Indicators: Grimacing;Guarding;Sore Pain Intervention(s): Limited activity within patient's tolerance    Home Living Family/patient expects to be discharged to:: Private residence Living Arrangements: Spouse/significant other;Children(states his 2 dtrs are here from [place]) Available Help at Discharge: Family Type of Home: House Home Access: Stairs to enter   Technical brewer of Steps: 2 Home Layout: One level   Additional Comments: states he has a 3-wheeled walker, denies having grab bars in shower stall--previous PT notes state that therre are grab bars in bathroom/shower    Prior Function Level of Independence: Independent         Comments: pt reports he just did a lot if work  around the house including washing cars which flared his back and knee  pain which contributed to fall      Hand Dominance        Extremity/Trunk Assessment   Upper Extremity Assessment Upper Extremity Assessment: Overall WFL for tasks assessed    Lower Extremity Assessment Lower Extremity Assessment: Generalized weakness;RLE deficits/detail;LLE deficits/detail RLE  Deficits / Details: right knee grossly 3/5 although intermittently has difficulty lifting LE against gravity; AROM grossly WFL per functional obs RLE Coordination: decreased gross motor LLE Deficits / Details: grossly 3 to 3+/5; pt with variable presentation during mobility/observation (at times exhibited ROM WFl, then unable to extend knee fully) LLE Coordination: decreased gross motor       Communication   Communication: No difficulties  Cognition Arousal/Alertness: Awake/alert Behavior During Therapy: WFL for tasks assessed/performed Overall Cognitive Status: Within Functional Limits for tasks assessed                                        General Comments      Exercises     Assessment/Plan    PT Assessment Patient needs continued PT services  PT Problem List Decreased strength;Decreased activity tolerance;Decreased balance;Decreased knowledge of use of DME;Pain;Decreased mobility       PT Treatment Interventions DME instruction;Gait training;Functional mobility training;Therapeutic activities;Therapeutic exercise;Patient/family education    PT Goals (Current goals can be found in the Care Plan section)  Acute Rehab PT Goals Patient Stated Goal: to go home PT Goal Formulation: With patient Time For Goal Achievement: 12/30/18 Potential to Achieve Goals: Good    Frequency Min 3X/week   Barriers to discharge        Co-evaluation               AM-PAC PT "6 Clicks" Mobility  Outcome Measure Help needed turning from your back to your side while in a flat bed without using bedrails?: A Little Help needed moving from lying on your back to sitting on the side of a flat bed without using bedrails?: None Help needed moving to and from a bed to a chair (including a wheelchair)?: A Little Help needed standing up from a chair using your arms (e.g., wheelchair or bedside chair)?: A Little Help needed to walk in hospital room?: A Little Help needed  climbing 3-5 steps with a railing? : A Little 6 Click Score: 19    End of Session Equipment Utilized During Treatment: Gait belt Activity Tolerance: Patient tolerated treatment well;Patient limited by pain Patient left: in bed;with call bell/phone within reach;with bed alarm set(sitter outside room)   PT Visit Diagnosis: Difficulty in walking, not elsewhere classified (R26.2);Pain Pain - part of body: (back, bil knees)    Time: 5956-3875 PT Time Calculation (min) (ACUTE ONLY): 28 min   Charges:   PT Evaluation $PT Eval Low Complexity: 1 Low PT Treatments $Gait Training: 8-22 mins        Kenyon Ana, PT  Pager: [phone] Acute Rehab Dept [facility]): [phone]   12/23/2018   [facility] 12/23/2018, 11:38 AM

## 2018-12-23 NOTE — ED Provider Notes (Signed)
73 year old male with increasing generalized weakness.  He is now needing assistance to ambulate with a walker.  This is a decline from his prior state.  He has no new focal weakness in his legs just generalized weakness.  He has worsening of his diffuse psoriasis with severe scaling and some early skin breakdown of his buttocks and low back.  This will be treated with that topical petroleum jelly to act as a barrier cream.  He was able to ambulate in the ER with a walker.  He was unsteady.  He is at high risk for falls.  His vital signs were reviewed and normal.  His labs here in the emergency department are without significant abnormality.  He does continue to drink alcohol and agrees this is likely not helping in his ambulation.  I had a long discussion with his family who understands the importance of close follow-up with the primary care physician.  They also will need follow-up with either a dermatologist or rheumatologist regarding his advanced psoriasis.  Home health will be contacted to provide additional home health services to provide ongoing care at home.  Social work and case management involved.  Patient's family updated throughout his course here in the emergency department and agreeable to discharge from the emergency department today with plan for home health assistance in the next 24 hours.   Jola Schmidt, MD 12/23/18 1202

## 2018-12-23 NOTE — ED Notes (Signed)
[facility] called for transport.  

## 2018-12-23 NOTE — ED Provider Notes (Signed)
Patient case reviewed with social work/case management. Home health resources will be provided. Vitals remain stable. Workup in the ER today without abnoramlity. No indication for additional workup or acute hospitalization at this time.    Jola Schmidt, MD 12/23/18 [phone]

## 2018-12-23 NOTE — ED Notes (Signed)
Pt discharged safely with PTAR.  Pt was in no distress.  All belongings were sent with patient.

## 2018-12-23 NOTE — Discharge Planning (Signed)
Lorali Khamis J. Clydene Laming, RN, BSN, [employer] [phone] Spoke with pt via RN regarding discharge planning for [employer]. Offered pt list of home health agencies to choose from.  Pt chose [facility] to render services.

## 2018-12-23 NOTE — ED Notes (Signed)
Pt walked to bathroom with assistance and a walker.  He bathed and put on his psoriasis cream.  He does need a lot of assistance getting up as he has back pain when standing and weak knees.  He is pleasant and cooperative.

## 2018-12-24 DIAGNOSIS — Z9181 History of falling: Secondary | ICD-10-CM | POA: Diagnosis not present

## 2018-12-24 DIAGNOSIS — I700 Atherosclerosis of aorta: Secondary | ICD-10-CM | POA: Diagnosis not present

## 2018-12-24 DIAGNOSIS — Z87891 Personal history of nicotine dependence: Secondary | ICD-10-CM | POA: Diagnosis not present

## 2018-12-24 DIAGNOSIS — M1710 Unilateral primary osteoarthritis, unspecified knee: Secondary | ICD-10-CM | POA: Diagnosis not present

## 2018-12-24 DIAGNOSIS — L4050 Arthropathic psoriasis, unspecified: Secondary | ICD-10-CM | POA: Diagnosis not present

## 2018-12-24 DIAGNOSIS — D62 Acute posthemorrhagic anemia: Secondary | ICD-10-CM | POA: Diagnosis not present

## 2018-12-24 DIAGNOSIS — R55 Syncope and collapse: Secondary | ICD-10-CM | POA: Diagnosis not present

## 2018-12-25 DIAGNOSIS — Z09 Encounter for follow-up examination after completed treatment for conditions other than malignant neoplasm: Secondary | ICD-10-CM | POA: Diagnosis not present

## 2018-12-25 DIAGNOSIS — R29898 Other symptoms and signs involving the musculoskeletal system: Secondary | ICD-10-CM | POA: Diagnosis not present

## 2018-12-29 ENCOUNTER — Emergency Department (HOSPITAL_COMMUNITY): Payer: Medicare Other

## 2018-12-29 ENCOUNTER — Emergency Department (HOSPITAL_COMMUNITY)
Admission: EM | Admit: 2018-12-29 | Discharge: 2018-12-29 | Disposition: A | Discharge status: Home / Self Care | Payer: Medicare Other | Attending: Emergency Medicine | Admitting: Emergency Medicine

## 2018-12-29 ENCOUNTER — Other Ambulatory Visit: Payer: Self-pay

## 2018-12-29 DIAGNOSIS — Z87891 Personal history of nicotine dependence: Secondary | ICD-10-CM | POA: Diagnosis not present

## 2018-12-29 DIAGNOSIS — Z8547 Personal history of malignant neoplasm of testis: Secondary | ICD-10-CM | POA: Diagnosis not present

## 2018-12-29 DIAGNOSIS — M545 Low back pain: Secondary | ICD-10-CM | POA: Insufficient documentation

## 2018-12-29 DIAGNOSIS — I10 Essential (primary) hypertension: Secondary | ICD-10-CM | POA: Diagnosis not present

## 2018-12-29 DIAGNOSIS — I959 Hypotension, unspecified: Secondary | ICD-10-CM | POA: Diagnosis not present

## 2018-12-29 DIAGNOSIS — Z79899 Other long term (current) drug therapy: Secondary | ICD-10-CM | POA: Insufficient documentation

## 2018-12-29 DIAGNOSIS — R42 Dizziness and giddiness: Secondary | ICD-10-CM | POA: Diagnosis not present

## 2018-12-29 DIAGNOSIS — R0902 Hypoxemia: Secondary | ICD-10-CM | POA: Diagnosis not present

## 2018-12-29 DIAGNOSIS — Z96651 Presence of right artificial knee joint: Secondary | ICD-10-CM | POA: Insufficient documentation

## 2018-12-29 DIAGNOSIS — R5381 Other malaise: Secondary | ICD-10-CM | POA: Diagnosis not present

## 2018-12-29 DIAGNOSIS — W19XXXA Unspecified fall, initial encounter: Secondary | ICD-10-CM | POA: Diagnosis not present

## 2018-12-29 LAB — BASIC METABOLIC PANEL
Anion gap: 9 (ref 5–15)
BUN: 12 mg/dL (ref 8–23)
CO2: 24 mmol/L (ref 22–32)
Calcium: 8.6 mg/dL — ABNORMAL LOW (ref 8.9–10.3)
Chloride: 108 mmol/L (ref 98–111)
Creatinine, Ser: 1.11 mg/dL (ref 0.61–1.24)
GFR calc Af Amer: >60 mL/min (ref >60)
GFR calc non Af Amer: >60 mL/min (ref >60)
Glucose, Bld: 110 mg/dL — ABNORMAL HIGH (ref 70–99)
Potassium: 3.7 mmol/L (ref 3.5–5.1)
Sodium: 141 mmol/L (ref 135–145)

## 2018-12-29 LAB — CBC WITH DIFFERENTIAL/PLATELET
Abs Immature Granulocytes: 0.26 10*3/uL — ABNORMAL HIGH (ref 0.00–0.07)
Basophils Absolute: 0.1 10*3/uL (ref 0.0–0.1)
Basophils Relative: 1 %
Eosinophils Absolute: 0.7 10*3/uL — ABNORMAL HIGH (ref 0.0–0.5)
Eosinophils Relative: 6 %
HCT: 41.4 % (ref 39.0–52.0)
Hemoglobin: 13.6 g/dL (ref 13.0–17.0)
Immature Granulocytes: 2 %
Lymphocytes Relative: 12 %
Lymphs Abs: 1.4 10*3/uL (ref 0.7–4.0)
MCH: 34.7 pg — ABNORMAL HIGH (ref 26.0–34.0)
MCHC: 32.9 g/dL (ref 30.0–36.0)
MCV: 105.6 fL — ABNORMAL HIGH (ref 80.0–100.0)
Monocytes Absolute: 1.2 10*3/uL — ABNORMAL HIGH (ref 0.1–1.0)
Monocytes Relative: 10 %
Neutro Abs: 8.2 10*3/uL — ABNORMAL HIGH (ref 1.7–7.7)
Neutrophils Relative %: 69 %
Platelets: 189 10*3/uL (ref 150–400)
RBC: 3.92 MIL/uL — ABNORMAL LOW (ref 4.22–5.81)
RDW: 13.4 % (ref 11.5–15.5)
WBC: 11.8 10*3/uL — ABNORMAL HIGH (ref 4.0–10.5)
nRBC: 0 % (ref 0.0–0.2)

## 2018-12-29 MED ORDER — SODIUM CHLORIDE 0.9 % IV BOLUS
1000.0000 mL | Freq: Once | INTRAVENOUS | Status: AC
  Administered 2018-12-29: 12:00:00 1000 mL via INTRAVENOUS

## 2018-12-29 NOTE — ED Notes (Signed)
Pt arrived to [facility] via stretcher - waiting for PTAR. Pt on his cell phone.

## 2018-12-29 NOTE — ED Notes (Signed)
Doctor at bedside.

## 2018-12-29 NOTE — ED Triage Notes (Signed)
Patient in via [facility] from home - called out for hypotension according to home BP cuff, initial manual BP 60/40. Patient states lightheadedness and vision changes started this morning and is exacerbated with position changes. Recently seen for fall last week - patient has had increase in generalized weakness, but denies cough, fevers/chills, shortness of breath, N/V/D. Given 500cc NS PTA with improvement in BP - 73/43. 18g. PIV LAC. A&O x 4.

## 2018-12-29 NOTE — ED Notes (Signed)
Called wife explained plan of care. States patient drinks alcohol everyday for 30 years and stopped last week when was in the hospital. Drinks 3 or 4 bourbon on ice with 1 or 2 beers a day. Doctor notified.

## 2018-12-29 NOTE — ED Notes (Signed)
PTAR called for transport home. 

## 2018-12-29 NOTE — ED Notes (Addendum)
PTAR has arrived to transport pt home. D/C instructions given to PTAR. Pt unable to sign signature pad. Pt verbalized he has all of his belongings including his cell phone.

## 2018-12-29 NOTE — ED Provider Notes (Signed)
[facility] EMERGENCY DEPARTMENT Provider Note   CSN: [id number] Arrival date & time: 12/29/18  1121    History   Chief Complaint Chief Complaint  Patient presents with   Hypotension    HPI Paul Pineda is a 73 y.o. male.     Patient is a 73 year old male with a history of syncope, dehydration, regular alcohol use, hypertension and hyperlipidemia who is presenting today for low blood pressure.  Patient states he was seen in the emergency room approximately a week ago for generalized weakness but all labs were relatively normal.  He has been having back issues and is planning a follow-up MRI in the future but states he checks his blood pressure every day and today when he checked it it was low which is different from his norm.  He did take his blood pressure medication today but states after his wife checked it and it was low he started feeling lightheaded and dizzy.  He sat back down but when EMS arrived his pressure was 70s over 50s and he was brought here for further care.  Patient denies any shortness of breath, chest pain, abdominal pain, nausea or vomiting.  He denies any change in his appetite.  He states he is still having issues with his back spasming which causes him to pause but is not having any weakness in his lower extremities or difficulty controlling his bowel or bladder.  Patient's wife does relay that the patient has drank alcohol every day for the last 30 years and stopped drinking last week when he was seen at the emergency room.  He does not use tobacco or drugs.  Currently he has no complaints and states he feels better.  The history is provided by the patient.    Past Medical History:  Diagnosis Date   Arthritis    Cancer ([place])    chest lump ,scrotum   Cataract    right   Heart murmur    Hyperlipidemia    Hypertension    Left bundle branch block    Pneumonia    hx    Patient Active Problem List   Diagnosis Date Noted    Aortic atherosclerosis ([place]) 10/09/2018   Occult malignancy ([place]) 10/01/2018   Syncope and collapse 08/15/2013   Tachycardia 08/15/2013   Dehydration 08/15/2013   Postoperative anemia due to acute blood loss 07/15/2013   OA (osteoarthritis) of knee 07/14/2013    Past Surgical History:  Procedure Laterality Date   CARDIAC CATHETERIZATION  10/11/2009   no sign CAD, EF 45-50%   INGUINAL HERNIA REPAIR Right 04/29/2013   Procedure: HERNIA REPAIR INGUINAL ADULT;  Surgeon: Ralene Ok, MD;  Location: [facility];  Service: General;  Laterality: Right;   INSERTION OF MESH Right 04/29/2013   Procedure: INSERTION OF MESH;  Surgeon: Ralene Ok, MD;  Location: [facility];  Service: General;  Laterality: Right;   lumps     scrotum, chest    TOTAL KNEE ARTHROPLASTY Right 07/14/2013   Procedure: RIGHT TOTAL KNEE ARTHROPLASTY;  Surgeon: Gearlean Alf, MD;  Location: [facility];  Service: Orthopedics;  Laterality: Right;        Home Medications    Prior to Admission medications   Medication Sig Start Date End Date Taking? Authorizing Provider  docusate sodium 100 MG CAPS Take 100 mg by mouth 2 (two) times daily as needed for mild constipation. 08/16/13  Yes Rai, Ripudeep K, MD  hydrOXYzine (ATARAX/VISTARIL) 25 MG tablet Take 1 tablet (25  mg total) by mouth every 6 (six) hours as needed for itching. Patient taking differently: Take 25 mg by mouth every 8 (eight) hours as needed for itching.  07/26/18  Yes Long, Wonda Olds, MD  lisinopril (PRINIVIL,ZESTRIL) 10 MG tablet Take 10 mg by mouth daily.   Yes [provider]  LORazepam (ATIVAN) 0.5 MG tablet Take 0.5 mg by mouth 2 (two) times daily.   Yes [provider]  methylPREDNISolone (MEDROL DOSEPAK) 4 MG TBPK tablet Take 4 mg by mouth See admin instructions. UAD on package 12/21/18  Yes [provider]  metoprolol tartrate (LOPRESSOR) 25 MG tablet Take 1 tablet (25 mg total) by mouth 2 (two) times daily. 08/17/13  Yes Rai,  Ripudeep K, MD  Multiple Vitamins-Minerals (CENTRUM SILVER 50+MEN) TABS Take 1 tablet by mouth daily.   Yes [provider]  tiZANidine (ZANAFLEX) 4 MG capsule Take 4 mg by mouth 3 (three) times daily as needed for muscle spasms.   Yes [provider]  triamcinolone cream (KENALOG) 0.1 % Apply 1 application topically 2 (two) times daily.   Yes [provider]  lisinopril (PRINIVIL,ZESTRIL) 5 MG tablet Take 1 tablet (5 mg total) by mouth daily. Patient not taking: Reported on 07/26/2018 08/17/13   Rai, Vernelle Emerald, MD  traMADol (ULTRAM) 50 MG tablet Take 1-2 tablets (50-100 mg total) by mouth every 6 (six) hours as needed (mild pain). Patient not taking: Reported on 07/26/2018 07/16/13   Joelene Millin, PA-C    Family History Family History  Problem Relation Age of Onset   Cancer Father        brain   Psoriasis Sister    Psoriasis Brother     Social History Social History   Tobacco Use   Smoking status: Former Smoker    Packs/day: 2.00    Years: 10.00    Pack years: 20.00    Types: Cigarettes    Last attempt to quit: 04/04/1985    Years since quitting: 33.7   Smokeless tobacco: Never Used  Substance Use Topics   Alcohol use: Yes    Alcohol/week: 14.0 standard drinks    Types: 14 Cans of beer per week    Comment: 1-2 daily-burbon on rocks nightly   Drug use: No     Allergies   Patient has no known allergies.   Review of Systems Review of Systems  All other systems reviewed and are negative.    Physical Exam Updated Vital Signs BP 128/83    Pulse 62    Temp 97.8 F (36.6 C) (Oral)    Resp (!) 21    Ht 5\' 10"  (1.778 m)    Wt 98.4 kg    SpO2 100%    BMI 31.14 kg/m   Physical Exam Vitals signs and nursing note reviewed.  Constitutional:      General: He is not in acute distress.    Appearance: He is well-developed.  HENT:     Head: Normocephalic and atraumatic.  Eyes:     Conjunctiva/sclera: Conjunctivae normal.      Pupils: Pupils are equal, round, and reactive to light.  Neck:     Musculoskeletal: Normal range of motion and neck supple.  Cardiovascular:     Rate and Rhythm: Normal rate and regular rhythm.     Heart sounds: No murmur.  Pulmonary:     Effort: Pulmonary effort is normal. No respiratory distress.     Breath sounds: Normal breath sounds. No wheezing or rales.  Abdominal:     General: There is no distension.     Palpations: Abdomen is soft.     Tenderness: There is no abdominal tenderness. There is no guarding or rebound.  Musculoskeletal: Normal range of motion.        General: No tenderness.     Comments: 5 out of 5 strength in bilateral lower extremities.  Sensation is intact.  Skin:    General: Skin is warm and dry.     Findings: Rash present. No erythema.     Comments: Diffuse red confluent rash from head to toe with scaling and peeling of his skin.  No areas of open skin or pain  Neurological:     General: No focal deficit present.     Mental Status: He is alert and oriented to person, place, and time. Mental status is at baseline.  Psychiatric:        Mood and Affect: Mood normal.        Behavior: Behavior normal.        Thought Content: Thought content normal.      ED Treatments / Results  Labs (all labs ordered are listed, but only abnormal results are displayed) Labs Reviewed  CBC WITH DIFFERENTIAL/PLATELET - Abnormal; Notable for the following components:      Result Value   WBC 11.8 (*)    RBC 3.92 (*)    MCV 105.6 (*)    MCH 34.7 (*)    Neutro Abs 8.2 (*)    Monocytes Absolute 1.2 (*)    Eosinophils Absolute 0.7 (*)    Abs Immature Granulocytes 0.26 (*)    All other components within normal limits  BASIC METABOLIC PANEL - Abnormal; Notable for the following components:   Glucose, Bld 110 (*)    Calcium 8.6 (*)    All other components within normal limits    EKG EKG Interpretation  Date/Time:  Sunday Dec 29 2018 11:31:26 EDT Ventricular Rate:   60 PR Interval:    QRS Duration: 103 QT Interval:  424 QTC Calculation: 424 R Axis:   38 Text Interpretation:  Sinus rhythm Short PR interval Minimal ST depression, lateral leads No significant change since last tracing Artifact Confirmed by Blanchie Dessert ([id number]) on 12/29/2018 11:37:43 AM   Radiology Mr Lumbar Spine Wo Contrast  Result Date: 12/29/2018 CLINICAL DATA:  Patient status post fall 1 week ago. Low back pain. Initial encounter. EXAM: MRI LUMBAR SPINE WITHOUT CONTRAST TECHNIQUE: Multiplanar, multisequence MR imaging of the lumbar spine was performed. No intravenous contrast was administered. COMPARISON:  CT chest, abdomen and pelvis 10/08/2018. FINDINGS: Segmentation:  Standard. Alignment:  Maintained with straightening of lordosis noted. Vertebrae: The patient has a congenitally narrow central spinal canal. Marked degenerative endplate signal change is seen from L3-S1. No fracture or worrisome lesion. Conus medullaris and cauda equina: Conus extends to the L1 level. Conus and cauda equina appear normal. Paraspinal and other soft tissues: Negative. Disc levels: T11-12 and T12-L1 are imaged in the sagittal plane only and negative. L1-2: There is a shallow disc bulge more prominent to the right. No stenosis. L2-3: Loss of disc space height. Large broad-based central and right paracentral protrusion and mild-to-moderate facet arthropathy are seen. There is severe central canal and bilateral subarticular recess narrowing. The foramina remain open. L3-4: Moderate facet degenerative change is worse on the left. There is some ligamentum flavum thickening. Loss of disc space height with a disc bulge and endplate spur eccentric to the left. There is narrowing  in the left subarticular recess with impingement on the descending left L4 root. Mild to moderate central canal stenosis is present overall. Right foramen is open. Mild left foraminal narrowing noted. L4-5: Broad-based disc bulge with endplate  spur. There is only mild central canal stenosis but narrowing in the subarticular recesses and impingement on the descending L5 roots is worse on the right. Mild to moderate left foraminal narrowing is present. The right foramen is open. L5-S1: Broad-based central protrusion and endplate spur are eccentric to the right. Right worse than left subarticular recess narrowing is present and could impact the S1 roots. Moderate to moderately severe foraminal narrowing is also worse on the right. IMPRESSION: No acute abnormality. Congenitally narrow central canal. Severe central canal and bilateral subarticular recess narrowing at L2-3. Narrowing in the left subarticular recess at L3-4 with impingement on the descending left L4 root. There is mild to moderate central canal stenosis overall at L3-4. Right worse than left subarticular recess narrowing at L4-5 with impingement on the descending L5 roots, particularly the right L5 root. Right worse than left subarticular recess narrowing at L5-S1 with impingement on the descending S1 roots, particularly the right S1 root. Moderate to moderately severe foraminal narrowing at this level is worse on the right. Electronically Signed   By: Inge Rise M.D.   On: 12/29/2018 13:45    Procedures Procedures (including critical care time)  Medications Ordered in ED Medications  sodium chloride 0.9 % bolus 1,000 mL (1,000 mLs Intravenous New Bag/Given 12/29/18 1156)     Initial Impression / Assessment and Plan / ED Course  I have reviewed the triage vital signs and the nursing notes.  Pertinent labs & imaging results that were available during my care of the patient were reviewed by me and considered in my medical decision making (see chart for details).        Old early male presenting today with hypotension and near syncope.  Patient states he felt his normal self yesterday and there was nothing out of the ordinary.  This morning he felt normal but he checks his  blood pressure every morning and today when he checked it it was low.  Normally it runs 130s over 70.  He did take his blood pressure medication this morning and after it read low he started feeling lightheaded and woozy.  He denies any other symptoms.  Patient does state that he is recently completed a course of prednisone for his psoriasis flare but that is getting better.  He also had been a long-term drinker but states he quit the last time he was seen in the hospital was about a week ago.  Patient initially had blood pressure of 80/50 but responded well to fluid and has been in the 130s since.  He has no complaints at this time.  Patient is not displaying signs of alcohol withdrawal.  Labs are reassuring with no acute change in his renal function or hemoglobin.  EKG without acute findings and patient is not displaying symptoms of ACS, PE or dissection.  Patient did get an MRI of his lumbar spine which he had scheduled soon but there is no sign of acute issues.  Patient was able to ambulate here with a walker on his own and did not experience lightheadedness.  Feel that patient is safe for discharge.  Final Clinical Impressions(s) / ED Diagnoses   Final diagnoses:  Hypotension, unspecified hypotension type    ED Discharge Orders    None  Blanchie Dessert, MD 12/29/18 1500

## 2018-12-29 NOTE — ED Notes (Signed)
Update family patient is in MRI.

## 2018-12-29 NOTE — ED Notes (Signed)
Nurse navigator updated family with patient on phone. Patient will be discharged and transported by [facility].

## 2018-12-29 NOTE — ED Notes (Signed)
Call bell w/in reach. TV remote given as requested.

## 2018-12-29 NOTE — ED Notes (Signed)
Nurse navigator received a phone call from [place] wife and Deneise Lever daughter regarding plan of care. Asked if he could have an MRI of back that they are trying to schedule since he is here now. Patient also talked with family on phone.

## 2018-12-29 NOTE — Discharge Instructions (Signed)
Today when you arrived your blood pressure was low but it responded quickly to IV fluids.  Tomorrow before taking your blood pressure medication check your blood pressure first to make sure it is not too low.  If it is on the lower side hold your blood pressure medicine eat and drink first and then see what it does.  Continue your other medications as prescribed.  Follow-up with your regular doctor about your ongoing back issues.  The MRI today shows a lot of arthritis but no new findings or herniated disc.

## 2018-12-29 NOTE — ED Notes (Signed)
Patient stated that he normally uses a walker to ambulate at home.  Patient ambulated with walker in the hallway and stated that "this was his normal".

## 2018-12-31 ENCOUNTER — Other Ambulatory Visit: Payer: Self-pay | Admitting: Neurosurgery

## 2018-12-31 DIAGNOSIS — M5126 Other intervertebral disc displacement, lumbar region: Secondary | ICD-10-CM | POA: Diagnosis not present

## 2019-01-01 ENCOUNTER — Encounter (HOSPITAL_COMMUNITY): Payer: Self-pay | Admitting: *Deleted

## 2019-01-01 ENCOUNTER — Other Ambulatory Visit: Payer: Self-pay | Admitting: Family Medicine

## 2019-01-01 ENCOUNTER — Other Ambulatory Visit: Payer: Self-pay

## 2019-01-01 DIAGNOSIS — M545 Low back pain, unspecified: Secondary | ICD-10-CM

## 2019-01-01 DIAGNOSIS — L409 Psoriasis, unspecified: Secondary | ICD-10-CM | POA: Diagnosis not present

## 2019-01-01 NOTE — Progress Notes (Signed)
Pt denies SOB, chest pain, and being under the care of a cardiologist. Pt stated that a stress test and cardiac cath were performed in 2011. Pt made aware to stop taking Stop taking Aspirin (unless otherwise advised by surgeon), vitamins, fish oil and herbal medications. Do not take any NSAIDs ie: Ibuprofen, Advil, Naproxen (Aleve), Motrin, BC and Goody Powder.  Pt denies that he and family members tested positive for COVID-19 (scheduled for COVID-19 test 01/02/19 at [facility]).   Pt denies that he and family members experienced the following symptoms:  Cough yes/no: No Fever (>100.29F)  yes/no: No Runny nose yes/no: No Sore throat yes/no: No Difficulty breathing/shortness of breath  yes/no: No  Have you or a family member traveled in the last 14 days and where? yes/no: No  Pt reminded that hospital visitation restrictions are in effect and the importance of the restrictions.  Pt verbalized understanding of all pre-op instructions.  PA, Anesthesiology, asked to review pt history; see note.

## 2019-01-01 NOTE — Progress Notes (Signed)
Anesthesia Chart Review:  Case:  [id number] Date/Time:  01/02/19 1415   Procedure:  Microdiscectomy - L2-L3 - DLL L3-L4 - left (Left Back)   Anesthesia type:  General   Pre-op diagnosis:  HNP   Location:  [facility] OR ROOM 18 / [facility]   Surgeon:  Kary Kos, MD      DISCUSSION: Patient is a 73 year old male scheduled for the above procedure. He is a late add-on. Reportedly just seen by Dr. Saintclair Halsted late afternoon 12/31/18 and per his staff put on OR schedule urgently.   History includes former smoker (quit 1986), HTN, HLD, heart murmur (no significant valvular abnormalities 07/2013 echo). 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 Candee Furbish. He ultimately had a cardiac cath to evaluate for LV dysfunction.  Cath showed no significant CAD, EF 45-50% and medical therapy with ACEI and b-blocker therapy recommended. LV dysfunction felt likely due to hypertension or alcohol abuse. Last echo in 2015 showed EF 40-45%. It does not appear that he is currently followed by cardiology, but meds include lisinopril and metoprolol tartrate.  He was seen by oncologist Dr. Lurline Del 09/2018 for evaluation of possible occult carcinoma following right upper chest skin biopsy showing "poorly differentiated carcinoma favor metastatic". History of BCC skin cancer and psoriasis. CT scans of chest/abd/pelvis and MRI liver did not show any obvious malignancy. Alpha-fetoprotein was mildly elevated, CEA and PSA WNL. PET scan was considered, but I don't see one resulted. 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    - ED visit 12/29/18 for hypotension with near syncope. BP 70-80's/50's, and SBP up to 130's after IVF. Had taken BP meds that morning. Long time drinker, but reported no ETOH in a week and no signs of alcohol withdrawal. Had completed a course of prednisone recently for psoriasis flare. Labs and EKG felt to be  reassuring. MRI of the L-spine done (I believe because already scheduled). He was discharged home.     - ED visit 12/22/18-12/23/18 for generalized weakness with recurrent falls. No acute abnormality identified but CSW consulted due to safety concerns--fall risk with generalized weakness, unsteady gait, daily alcohol use. Advised to follow-up with dermatologist or rheumatologist for advanced psoriasis.   Patient is a same day work-up. Known intermittent LBBB and LV dysfunction with last EF 40-45% in 2015, which in the past was attributed to HTN vs ETOH. He remains on b-blocker and ACE-I, although was hypotensive on 12/29/18. Had recent steroids, but unclear if that was thought to play a role. BP did improve with IVF. Discussed with anesthesiologist Albertha Ghee, MD. Patient will be further evaluated on the the day of surgery and definitive plan made at that time--want to ensure no active HF or hypotensive symptoms.     VS: Last vitals recorded in ED 12/29/18 4:44 PM: BP 143/79, RR 18, HR 73, O2 sat 100%. BMI 31.13. WT 98.4 KG.   PROVIDERS: [name], MD is PCP  Nat Math, Sarajane Jews, MD is HEM-ONC He saw cardiologist Candee Furbish, MD in 2011 as discussed above, but not recent cardiology notes seen.   LABS: Last labs include: Lab Results  Component Value Date   WBC 11.8 (H) 12/29/2018   HGB 13.6 12/29/2018   HCT 41.4 12/29/2018   PLT 189 12/29/2018   GLUCOSE 110 (H) 12/29/2018   ALT 33 12/22/2018   AST 44 (H) 12/22/2018  NA 141 12/29/2018   K 3.7 12/29/2018   CL 108 12/29/2018   CREATININE 1.11 12/29/2018   BUN 12 12/29/2018   CO2 24 12/29/2018    IMAGES: MRI L-spine 12/29/18: IMPRESSION: - No acute abnormality. - Congenitally narrow central canal. - Severe central canal and bilateral subarticular recess narrowing at L2-3. - Narrowing in the left subarticular recess at L3-4 with impingement on the descending left L4 root. There is mild to moderate central canal stenosis overall  at L3-4. - Right worse than left subarticular recess narrowing at L4-5 with impingement on the descending L5 roots, particularly the right L5 root. - Right worse than left subarticular recess narrowing at L5-S1 with impingement on the descending S1 roots, particularly the right S1 root. Moderate to moderately severe foraminal narrowing at this level is worse on the right.  CT head 12/22/18: IMPRESSION: Mild diffuse atrophy. Brain parenchyma appears unremarkable. No mass or hemorrhage.  MRI liver 10/25/18: IMPRESSION: 1. No significant liver lesion is identified. 2. No primary malignancy is identified. 3.  Aortic Atherosclerosis (ICD10-I70.0). 4. Sigmoid colon diverticulosis. 5. Lower lumbar spondylosis and degenerative disc disease.  CT chest/abd/pelvis 10/08/18: IMPRESSION: 1. No evidence of metastatic disease. 2. Hepatic steatosis. 3. Aortic atherosclerosis (ICD10-170.0). Coronary artery Calcification.   EKG:  12/29/18: Sinus rhythm Short PR interval Minimal ST depression, lateral leads No significant change since last tracing Artifact Confirmed by Blanchie Dessert [phone]) on 12/29/2018 11:37:43 AM - There is diffuse baseline fine artifact  He has had an intermittent left BBB since at least 09/27/09.    CV: Echo 08/16/13 (done in setting s/p right TKA 07/14/13, with poor post-op intake, nausea, hypotension, tachycardia, and syncope--felt related to dehydration, CTA negative for PE; Report can be viewed in Progress Note from Basilia Jumbo, [place]): Study Conclusions - Left ventricle: The cavity size was normal. Systolic function was mildly to moderately reduced. The estimated ejection fraction was in the range of 40% to 45%. Wall motion was normal; there were no regional wall motion abnormalities. There was an increased relative contribution of atrial contraction to ventricular filling. - Aortic valve: Trileaflet. Normal thickness leaflets. No stenosis. No  regurgitation. - Mitral valve: No stenosis. No regurgitation. - Right ventricle: Normal systolic function.  - Pulmonic valve: No evidence for stenosis. - Tricuspid valve: No significant regurgitation. - Pulmonary artery: Systolic pressure was within the normal range. (Comparison echo 10/01/09, scanned under Media tab, Outside Record, 10/13/09 showed: Normal LV chamber size with moderately reduced contraction, anteroseptal wall hypokinesis, LVEF 96-75%, grade I diastolic dysfunction, mild MR)    Cardiac cath 10/11/09 Marlou Porch, Mark, MD; done for abnormal stress test showing no ischemia, EF 37%): IMPRESSION:  1.  No angiographically significant coronary artery disease. 2.  Mildly reduced ejection fraction of 45 to 50% with no wall motion abnormalities, no significant mitral regurgitation.  Insignificant peak to peak gradient across aortic valve. Plan: Mildly reduced EF may be secondary to either hypertension or possible alcohol use.  Continue ACE inhibitor, beta-blocker and decrease alcohol intake.    Past Medical History:  Diagnosis Date  . Arthritis   . Cancer (HCC)    chest lump ,scrotum  . Cataract    right  . Heart murmur   . Hyperlipidemia   . Hypertension   . Left bundle branch block   . Pneumonia    hx    Past Surgical History:  Procedure Laterality Date  . CARDIAC CATHETERIZATION  10/11/2009   no sign CAD, EF 45-50%  . INGUINAL HERNIA  REPAIR Right 04/29/2013   Procedure: HERNIA REPAIR INGUINAL ADULT;  Surgeon: Ralene Ok, MD;  Location: [facility];  Service: General;  Laterality: Right;  . INSERTION OF MESH Right 04/29/2013   Procedure: INSERTION OF MESH;  Surgeon: Ralene Ok, MD;  Location: [facility];  Service: General;  Laterality: Right;  . lumps     scrotum, chest   . TOTAL KNEE ARTHROPLASTY Right 07/14/2013   Procedure: RIGHT TOTAL KNEE ARTHROPLASTY;  Surgeon: Gearlean Alf, MD;  Location: [facility];  Service: Orthopedics;  Laterality: Right;    MEDICATIONS: No  current facility-administered medications for this encounter.    . docusate sodium 100 MG CAPS  . hydrOXYzine (ATARAX/VISTARIL) 25 MG tablet  . lisinopril (PRINIVIL,ZESTRIL) 10 MG tablet  . lisinopril (PRINIVIL,ZESTRIL) 5 MG tablet  . LORazepam (ATIVAN) 0.5 MG tablet  . metoprolol tartrate (LOPRESSOR) 25 MG tablet  . Multiple Vitamins-Minerals (CENTRUM SILVER 50+MEN) TABS  . tiZANidine (ZANAFLEX) 4 MG capsule  . traMADol (ULTRAM) 50 MG tablet  . triamcinolone cream (KENALOG) 0.1 %    Myra Gianotti, PA-C Surgical Short Stay/Anesthesiology [facility] Phone [phone] [facility] Phone [phone] 01/01/2019 4:53 PM

## 2019-01-01 NOTE — Anesthesia Preprocedure Evaluation (Addendum)
Anesthesia Evaluation  Patient identified by MRN, date of birth, ID band Patient awake    Reviewed: Allergy & Precautions, NPO status , Patient's Chart, lab work & pertinent test results  Airway Mallampati: II  TM Distance: >3 FB Neck ROM: Full    Dental  (+) Teeth Intact, Dental Advisory Given   Pulmonary former smoker,    breath sounds clear to auscultation       Cardiovascular hypertension,  Rhythm:Regular Rate:Normal     Neuro/Psych    GI/Hepatic   Endo/Other    Renal/GU      Musculoskeletal   Abdominal   Peds  Hematology   Anesthesia Other Findings   Reproductive/Obstetrics                            Anesthesia Physical Anesthesia Plan  ASA: III  Anesthesia Plan: General   Post-op Pain Management:    Induction: Intravenous  PONV Risk Score and Plan: Ondansetron and Dexamethasone  Airway Management Planned: Oral ETT  Additional Equipment:   Intra-op Plan:   Post-operative Plan: Extubation in OR  Informed Consent: I have reviewed the patients History and Physical, chart, labs and discussed the procedure including the risks, benefits and alternatives for the proposed anesthesia with the patient or authorized representative who has indicated his/her understanding and acceptance.     Dental advisory given  Plan Discussed with: CRNA and Anesthesiologist  Anesthesia Plan Comments: (See PAT note written 01/01/2019 by Myra Gianotti, PA-C. Same-day work-up. )       Anesthesia Quick Evaluation

## 2019-01-02 ENCOUNTER — Other Ambulatory Visit (HOSPITAL_COMMUNITY)
Admission: RE | Admit: 2019-01-02 | Discharge: 2019-01-02 | Disposition: A | Discharge status: Home / Self Care | Payer: Medicare Other | Source: Ambulatory Visit | Attending: Neurosurgery | Admitting: Neurosurgery

## 2019-01-02 ENCOUNTER — Inpatient Hospital Stay (HOSPITAL_COMMUNITY)
Admission: RE | Admit: 2019-01-02 | Discharge: 2019-01-07 | DRG: 520 | Disposition: A | Discharge status: Rehabilitation Facility | Payer: Medicare Other | Attending: Neurosurgery | Admitting: Neurosurgery

## 2019-01-02 ENCOUNTER — Encounter (HOSPITAL_COMMUNITY)
Admission: RE | Disposition: A | Discharge status: Rehabilitation Facility | Payer: Self-pay | Source: Home / Self Care | Attending: Neurosurgery

## 2019-01-02 ENCOUNTER — Encounter (HOSPITAL_COMMUNITY): Payer: Self-pay | Admitting: General Practice

## 2019-01-02 ENCOUNTER — Ambulatory Visit (HOSPITAL_COMMUNITY): Payer: Medicare Other | Admitting: Vascular Surgery

## 2019-01-02 ENCOUNTER — Other Ambulatory Visit: Payer: Self-pay

## 2019-01-02 ENCOUNTER — Ambulatory Visit (HOSPITAL_COMMUNITY): Payer: Medicare Other

## 2019-01-02 DIAGNOSIS — M5416 Radiculopathy, lumbar region: Secondary | ICD-10-CM | POA: Diagnosis not present

## 2019-01-02 DIAGNOSIS — E785 Hyperlipidemia, unspecified: Secondary | ICD-10-CM | POA: Diagnosis present

## 2019-01-02 DIAGNOSIS — M4800 Spinal stenosis, site unspecified: Secondary | ICD-10-CM | POA: Diagnosis not present

## 2019-01-02 DIAGNOSIS — M5136 Other intervertebral disc degeneration, lumbar region: Secondary | ICD-10-CM | POA: Diagnosis not present

## 2019-01-02 DIAGNOSIS — M5116 Intervertebral disc disorders with radiculopathy, lumbar region: Secondary | ICD-10-CM | POA: Diagnosis not present

## 2019-01-02 DIAGNOSIS — I700 Atherosclerosis of aorta: Secondary | ICD-10-CM | POA: Diagnosis not present

## 2019-01-02 DIAGNOSIS — M10071 Idiopathic gout, right ankle and foot: Secondary | ICD-10-CM | POA: Diagnosis not present

## 2019-01-02 DIAGNOSIS — Z85828 Personal history of other malignant neoplasm of skin: Secondary | ICD-10-CM

## 2019-01-02 DIAGNOSIS — Z87891 Personal history of nicotine dependence: Secondary | ICD-10-CM

## 2019-01-02 DIAGNOSIS — I824Z2 Acute embolism and thrombosis of unspecified deep veins of left distal lower extremity: Secondary | ICD-10-CM | POA: Diagnosis not present

## 2019-01-02 DIAGNOSIS — Z9181 History of falling: Secondary | ICD-10-CM

## 2019-01-02 DIAGNOSIS — M48061 Spinal stenosis, lumbar region without neurogenic claudication: Secondary | ICD-10-CM | POA: Diagnosis not present

## 2019-01-02 DIAGNOSIS — Z79891 Long term (current) use of opiate analgesic: Secondary | ICD-10-CM | POA: Diagnosis not present

## 2019-01-02 DIAGNOSIS — Z79899 Other long term (current) drug therapy: Secondary | ICD-10-CM

## 2019-01-02 DIAGNOSIS — M5126 Other intervertebral disc displacement, lumbar region: Secondary | ICD-10-CM | POA: Diagnosis not present

## 2019-01-02 DIAGNOSIS — Z809 Family history of malignant neoplasm, unspecified: Secondary | ICD-10-CM

## 2019-01-02 DIAGNOSIS — K5900 Constipation, unspecified: Secondary | ICD-10-CM | POA: Diagnosis present

## 2019-01-02 DIAGNOSIS — Z1159 Encounter for screening for other viral diseases: Secondary | ICD-10-CM | POA: Diagnosis not present

## 2019-01-02 DIAGNOSIS — M1990 Unspecified osteoarthritis, unspecified site: Secondary | ICD-10-CM | POA: Diagnosis not present

## 2019-01-02 DIAGNOSIS — Z96651 Presence of right artificial knee joint: Secondary | ICD-10-CM | POA: Diagnosis not present

## 2019-01-02 DIAGNOSIS — H269 Unspecified cataract: Secondary | ICD-10-CM | POA: Diagnosis present

## 2019-01-02 DIAGNOSIS — H9190 Unspecified hearing loss, unspecified ear: Secondary | ICD-10-CM | POA: Diagnosis not present

## 2019-01-02 DIAGNOSIS — Z419 Encounter for procedure for purposes other than remedying health state, unspecified: Secondary | ICD-10-CM

## 2019-01-02 DIAGNOSIS — M7989 Other specified soft tissue disorders: Secondary | ICD-10-CM | POA: Diagnosis not present

## 2019-01-02 DIAGNOSIS — I82402 Acute embolism and thrombosis of unspecified deep veins of left lower extremity: Secondary | ICD-10-CM | POA: Diagnosis not present

## 2019-01-02 DIAGNOSIS — Z808 Family history of malignant neoplasm of other organs or systems: Secondary | ICD-10-CM | POA: Diagnosis not present

## 2019-01-02 DIAGNOSIS — M47816 Spondylosis without myelopathy or radiculopathy, lumbar region: Secondary | ICD-10-CM | POA: Diagnosis not present

## 2019-01-02 DIAGNOSIS — I447 Left bundle-branch block, unspecified: Secondary | ICD-10-CM | POA: Diagnosis not present

## 2019-01-02 DIAGNOSIS — D62 Acute posthemorrhagic anemia: Secondary | ICD-10-CM | POA: Diagnosis not present

## 2019-01-02 DIAGNOSIS — I10 Essential (primary) hypertension: Secondary | ICD-10-CM | POA: Diagnosis present

## 2019-01-02 DIAGNOSIS — Z4789 Encounter for other orthopedic aftercare: Secondary | ICD-10-CM | POA: Diagnosis not present

## 2019-01-02 DIAGNOSIS — L409 Psoriasis, unspecified: Secondary | ICD-10-CM | POA: Diagnosis not present

## 2019-01-02 HISTORY — PX: LUMBAR LAMINECTOMY/DECOMPRESSION MICRODISCECTOMY: SHX5026

## 2019-01-02 HISTORY — DX: Psoriasis, unspecified: L40.9

## 2019-01-02 HISTORY — DX: Unspecified hearing loss, unspecified ear: H91.90

## 2019-01-02 LAB — SARS CORONAVIRUS 2 BY RT PCR (HOSPITAL ORDER, PERFORMED IN ~~LOC~~ HOSPITAL LAB): SARS Coronavirus 2: NEGATIVE

## 2019-01-02 SURGERY — LUMBAR LAMINECTOMY/DECOMPRESSION MICRODISCECTOMY 2 LEVELS
Anesthesia: General | Site: Spine Lumbar | Laterality: Left

## 2019-01-02 MED ORDER — ONDANSETRON HCL 4 MG/2ML IJ SOLN: 4.0000 mg | Freq: Once | INTRAMUSCULAR | Status: DC | PRN

## 2019-01-02 MED ORDER — BUPIVACAINE HCL (PF) 0.25 % IJ SOLN
INTRAMUSCULAR | Status: AC
  Filled 2019-01-02: qty 30

## 2019-01-02 MED ORDER — MENTHOL 3 MG MT LOZG: 1.0000 | LOZENGE | OROMUCOSAL | Status: DC | PRN

## 2019-01-02 MED ORDER — FENTANYL CITRATE (PF) 250 MCG/5ML IJ SOLN
INTRAMUSCULAR | Status: AC
  Filled 2019-01-02: qty 5

## 2019-01-02 MED ORDER — SODIUM CHLORIDE 0.9 % IV SOLN: 250.0000 mL | INTRAVENOUS | Status: DC

## 2019-01-02 MED ORDER — SODIUM CHLORIDE 0.9 % IV SOLN
INTRAVENOUS | Status: DC | PRN
  Administered 2019-01-02: 16:00:00

## 2019-01-02 MED ORDER — ARTIFICIAL TEARS OPHTHALMIC OINT
TOPICAL_OINTMENT | OPHTHALMIC | Status: AC
  Filled 2019-01-02: qty 3.5

## 2019-01-02 MED ORDER — PHENYLEPHRINE 40 MCG/ML (10ML) SYRINGE FOR IV PUSH (FOR BLOOD PRESSURE SUPPORT)
PREFILLED_SYRINGE | INTRAVENOUS | Status: AC
  Filled 2019-01-02: qty 10

## 2019-01-02 MED ORDER — SUGAMMADEX SODIUM 200 MG/2ML IV SOLN
INTRAVENOUS | Status: DC | PRN
  Administered 2019-01-02: 200 mg via INTRAVENOUS

## 2019-01-02 MED ORDER — LISINOPRIL 5 MG PO TABS: 5.0000 mg | ORAL_TABLET | Freq: Every day | ORAL | Status: DC

## 2019-01-02 MED ORDER — ROCURONIUM BROMIDE 50 MG/5ML IV SOSY
PREFILLED_SYRINGE | INTRAVENOUS | Status: DC | PRN
  Administered 2019-01-02: 30 mg via INTRAVENOUS
  Administered 2019-01-02: 10 mg via INTRAVENOUS

## 2019-01-02 MED ORDER — LIDOCAINE 2% (20 MG/ML) 5 ML SYRINGE
INTRAMUSCULAR | Status: AC
  Filled 2019-01-02: qty 5

## 2019-01-02 MED ORDER — SODIUM CHLORIDE 0.9% FLUSH
3.0000 mL | Freq: Two times a day (BID) | INTRAVENOUS | Status: DC
  Administered 2019-01-02 – 2019-01-07 (×7): 3 mL via INTRAVENOUS

## 2019-01-02 MED ORDER — ONDANSETRON HCL 4 MG/2ML IJ SOLN
INTRAMUSCULAR | Status: DC | PRN
  Administered 2019-01-02: 4 mg via INTRAVENOUS

## 2019-01-02 MED ORDER — MIDAZOLAM HCL 2 MG/2ML IJ SOLN
INTRAMUSCULAR | Status: AC
  Filled 2019-01-02: qty 2

## 2019-01-02 MED ORDER — ACETAMINOPHEN 10 MG/ML IV SOLN
INTRAVENOUS | Status: AC
  Filled 2019-01-02: qty 100

## 2019-01-02 MED ORDER — THROMBIN 5000 UNITS EX SOLR
CUTANEOUS | Status: DC | PRN
  Administered 2019-01-02 (×2): 5000 [IU] via TOPICAL

## 2019-01-02 MED ORDER — ADULT MULTIVITAMIN W/MINERALS CH
1.0000 | ORAL_TABLET | Freq: Every day | ORAL | Status: DC
  Administered 2019-01-03 – 2019-01-07 (×5): 1 via ORAL
  Filled 2019-01-02 (×5): qty 1

## 2019-01-02 MED ORDER — ONDANSETRON HCL 4 MG/2ML IJ SOLN
INTRAMUSCULAR | Status: AC
  Filled 2019-01-02: qty 2

## 2019-01-02 MED ORDER — HYDROXYZINE HCL 25 MG PO TABS
25.0000 mg | ORAL_TABLET | Freq: Three times a day (TID) | ORAL | Status: DC | PRN
  Administered 2019-01-02 – 2019-01-05 (×3): 25 mg via ORAL
  Filled 2019-01-02 (×3): qty 1

## 2019-01-02 MED ORDER — TRIAMCINOLONE ACETONIDE 0.1 % EX CREA
1.0000 "application " | TOPICAL_CREAM | Freq: Two times a day (BID) | CUTANEOUS | Status: DC
  Administered 2019-01-03 – 2019-01-07 (×8): 1 via TOPICAL
  Filled 2019-01-02 (×2): qty 15

## 2019-01-02 MED ORDER — EPHEDRINE SULFATE 50 MG/ML IJ SOLN
INTRAMUSCULAR | Status: DC | PRN
  Administered 2019-01-02 (×3): 10 mg via INTRAVENOUS

## 2019-01-02 MED ORDER — NEOSTIGMINE METHYLSULFATE 3 MG/3ML IV SOSY
PREFILLED_SYRINGE | INTRAVENOUS | Status: AC
  Filled 2019-01-02: qty 3

## 2019-01-02 MED ORDER — ACETAMINOPHEN 325 MG PO TABS: 650.0000 mg | ORAL_TABLET | ORAL | Status: DC | PRN

## 2019-01-02 MED ORDER — LIDOCAINE-EPINEPHRINE 1 %-1:100000 IJ SOLN
INTRAMUSCULAR | Status: AC
  Filled 2019-01-02: qty 1

## 2019-01-02 MED ORDER — FENTANYL CITRATE (PF) 100 MCG/2ML IJ SOLN: 25.0000 ug | INTRAMUSCULAR | Status: DC | PRN

## 2019-01-02 MED ORDER — EPHEDRINE 5 MG/ML INJ
INTRAVENOUS | Status: AC
  Filled 2019-01-02: qty 10

## 2019-01-02 MED ORDER — PROPOFOL 10 MG/ML IV BOLUS
INTRAVENOUS | Status: AC
  Filled 2019-01-02: qty 20

## 2019-01-02 MED ORDER — LIDOCAINE-EPINEPHRINE 1 %-1:100000 IJ SOLN
INTRAMUSCULAR | Status: DC | PRN
  Administered 2019-01-02: 10 mL

## 2019-01-02 MED ORDER — CHLORHEXIDINE GLUCONATE CLOTH 2 % EX PADS: 6.0000 | MEDICATED_PAD | Freq: Once | CUTANEOUS | Status: DC

## 2019-01-02 MED ORDER — SODIUM CHLORIDE 0.9 % IV SOLN
INTRAVENOUS | Status: DC | PRN
  Administered 2019-01-02: 30 ug/min via INTRAVENOUS

## 2019-01-02 MED ORDER — METOPROLOL TARTRATE 25 MG PO TABS
25.0000 mg | ORAL_TABLET | Freq: Two times a day (BID) | ORAL | Status: DC
  Administered 2019-01-02 – 2019-01-07 (×10): 25 mg via ORAL
  Filled 2019-01-02 (×10): qty 1

## 2019-01-02 MED ORDER — LACTATED RINGERS IV SOLN
INTRAVENOUS | Status: DC
  Administered 2019-01-02 (×3): via INTRAVENOUS

## 2019-01-02 MED ORDER — TRAMADOL HCL 50 MG PO TABS
50.0000 mg | ORAL_TABLET | Freq: Four times a day (QID) | ORAL | Status: DC | PRN
  Administered 2019-01-06: 100 mg via ORAL
  Filled 2019-01-02: qty 2

## 2019-01-02 MED ORDER — LORAZEPAM 0.5 MG PO TABS
0.5000 mg | ORAL_TABLET | Freq: Two times a day (BID) | ORAL | Status: DC
  Administered 2019-01-02 – 2019-01-07 (×10): 0.5 mg via ORAL
  Filled 2019-01-02 (×10): qty 1

## 2019-01-02 MED ORDER — ACETAMINOPHEN 10 MG/ML IV SOLN
INTRAVENOUS | Status: DC | PRN
  Administered 2019-01-02: 1000 mg via INTRAVENOUS

## 2019-01-02 MED ORDER — SUCCINYLCHOLINE CHLORIDE 20 MG/ML IJ SOLN
INTRAMUSCULAR | Status: DC | PRN
  Administered 2019-01-02: 120 mg via INTRAVENOUS

## 2019-01-02 MED ORDER — 0.9 % SODIUM CHLORIDE (POUR BTL) OPTIME
TOPICAL | Status: DC | PRN
  Administered 2019-01-02: 16:00:00 1000 mL

## 2019-01-02 MED ORDER — LISINOPRIL 10 MG PO TABS
10.0000 mg | ORAL_TABLET | Freq: Every day | ORAL | Status: DC
  Administered 2019-01-02 – 2019-01-07 (×6): 10 mg via ORAL
  Filled 2019-01-02 (×6): qty 1

## 2019-01-02 MED ORDER — ACETAMINOPHEN 650 MG RE SUPP: 650.0000 mg | RECTAL | Status: DC | PRN

## 2019-01-02 MED ORDER — LIDOCAINE 2% (20 MG/ML) 5 ML SYRINGE
INTRAMUSCULAR | Status: DC | PRN
  Administered 2019-01-02: 40 mg via INTRAVENOUS

## 2019-01-02 MED ORDER — PROPOFOL 10 MG/ML IV BOLUS
INTRAVENOUS | Status: DC | PRN
  Administered 2019-01-02: 140 mg via INTRAVENOUS

## 2019-01-02 MED ORDER — GLYCOPYRROLATE PF 0.2 MG/ML IJ SOSY
PREFILLED_SYRINGE | INTRAMUSCULAR | Status: AC
  Filled 2019-01-02: qty 1

## 2019-01-02 MED ORDER — SODIUM CHLORIDE 0.9% FLUSH: 3.0000 mL | INTRAVENOUS | Status: DC | PRN

## 2019-01-02 MED ORDER — PHENOL 1.4 % MT LIQD: 1.0000 | OROMUCOSAL | Status: DC | PRN

## 2019-01-02 MED ORDER — ROCURONIUM BROMIDE 10 MG/ML (PF) SYRINGE
PREFILLED_SYRINGE | INTRAVENOUS | Status: AC
  Filled 2019-01-02: qty 10

## 2019-01-02 MED ORDER — CEFAZOLIN SODIUM-DEXTROSE 2-4 GM/100ML-% IV SOLN
2.0000 g | INTRAVENOUS | Status: AC
  Administered 2019-01-02: 2 g via INTRAVENOUS
  Filled 2019-01-02: qty 100

## 2019-01-02 MED ORDER — ONDANSETRON HCL 4 MG PO TABS: 4.0000 mg | ORAL_TABLET | Freq: Four times a day (QID) | ORAL | Status: DC | PRN

## 2019-01-02 MED ORDER — OXYCODONE HCL 5 MG/5ML PO SOLN: 5.0000 mg | Freq: Once | ORAL | Status: DC | PRN

## 2019-01-02 MED ORDER — BUPIVACAINE LIPOSOME 1.3 % IJ SUSP
20.0000 mL | INTRAMUSCULAR | Status: DC
  Filled 2019-01-02: qty 20

## 2019-01-02 MED ORDER — TIZANIDINE HCL 4 MG PO TABS
4.0000 mg | ORAL_TABLET | Freq: Three times a day (TID) | ORAL | Status: DC | PRN
  Administered 2019-01-05: 4 mg via ORAL
  Filled 2019-01-02 (×2): qty 1

## 2019-01-02 MED ORDER — SUCCINYLCHOLINE CHLORIDE 200 MG/10ML IV SOSY
PREFILLED_SYRINGE | INTRAVENOUS | Status: AC
  Filled 2019-01-02: qty 10

## 2019-01-02 MED ORDER — DEXAMETHASONE SODIUM PHOSPHATE 10 MG/ML IJ SOLN
10.0000 mg | INTRAMUSCULAR | Status: AC
  Administered 2019-01-02: 10 mg via INTRAVENOUS
  Filled 2019-01-02: qty 1

## 2019-01-02 MED ORDER — HYDROMORPHONE HCL 1 MG/ML IJ SOLN: 1.0000 mg | INTRAMUSCULAR | Status: DC | PRN

## 2019-01-02 MED ORDER — CEFAZOLIN SODIUM-DEXTROSE 2-4 GM/100ML-% IV SOLN
2.0000 g | Freq: Three times a day (TID) | INTRAVENOUS | Status: AC
  Administered 2019-01-02 – 2019-01-04 (×6): 2 g via INTRAVENOUS
  Filled 2019-01-02 (×7): qty 100

## 2019-01-02 MED ORDER — PANTOPRAZOLE SODIUM 40 MG IV SOLR
40.0000 mg | Freq: Every day | INTRAVENOUS | Status: DC
  Administered 2019-01-02 – 2019-01-03 (×2): 40 mg via INTRAVENOUS
  Filled 2019-01-02 (×2): qty 40

## 2019-01-02 MED ORDER — ONDANSETRON HCL 4 MG/2ML IJ SOLN: 4.0000 mg | Freq: Four times a day (QID) | INTRAMUSCULAR | Status: DC | PRN

## 2019-01-02 MED ORDER — SODIUM CHLORIDE (PF) 0.9 % IJ SOLN
INTRAMUSCULAR | Status: AC
  Filled 2019-01-02: qty 10

## 2019-01-02 MED ORDER — ALUM & MAG HYDROXIDE-SIMETH 200-200-20 MG/5ML PO SUSP: 30.0000 mL | Freq: Four times a day (QID) | ORAL | Status: DC | PRN

## 2019-01-02 MED ORDER — CYCLOBENZAPRINE HCL 10 MG PO TABS
10.0000 mg | ORAL_TABLET | Freq: Three times a day (TID) | ORAL | Status: DC | PRN
  Administered 2019-01-03: 03:00:00 10 mg via ORAL
  Filled 2019-01-02: qty 1

## 2019-01-02 MED ORDER — BUPIVACAINE HCL (PF) 0.25 % IJ SOLN
INTRAMUSCULAR | Status: DC | PRN
  Administered 2019-01-02: 20 mL

## 2019-01-02 MED ORDER — METOPROLOL TARTRATE 12.5 MG HALF TABLET
25.0000 mg | ORAL_TABLET | Freq: Once | ORAL | Status: AC
  Administered 2019-01-02: 25 mg via ORAL
  Filled 2019-01-02: qty 2

## 2019-01-02 MED ORDER — HEMOSTATIC AGENTS (NO CHARGE) OPTIME
TOPICAL | Status: DC | PRN
  Administered 2019-01-02: 1 via TOPICAL

## 2019-01-02 MED ORDER — CEFAZOLIN SODIUM-DEXTROSE 2-4 GM/100ML-% IV SOLN
INTRAVENOUS | Status: AC
  Filled 2019-01-02: qty 100

## 2019-01-02 MED ORDER — FENTANYL CITRATE (PF) 100 MCG/2ML IJ SOLN
INTRAMUSCULAR | Status: DC | PRN
  Administered 2019-01-02: 50 ug via INTRAVENOUS
  Administered 2019-01-02 (×2): 75 ug via INTRAVENOUS

## 2019-01-02 MED ORDER — OXYCODONE HCL 5 MG PO TABS: 5.0000 mg | ORAL_TABLET | Freq: Once | ORAL | Status: DC | PRN

## 2019-01-02 MED ORDER — OXYCODONE HCL 5 MG PO TABS
10.0000 mg | ORAL_TABLET | ORAL | Status: DC | PRN
  Administered 2019-01-02 – 2019-01-07 (×15): 10 mg via ORAL
  Filled 2019-01-02 (×15): qty 2

## 2019-01-02 MED ORDER — THROMBIN 5000 UNITS EX SOLR
CUTANEOUS | Status: AC
  Filled 2019-01-02: qty 10000

## 2019-01-02 SURGICAL SUPPLY — 60 items
ADH SKN CLS APL DERMABOND .7 (GAUZE/BANDAGES/DRESSINGS) ×1
APL SKNCLS STERI-STRIP NONHPOA (GAUZE/BANDAGES/DRESSINGS) ×1
BAG DECANTER FOR FLEXI CONT (MISCELLANEOUS) ×2 IMPLANT
BENZOIN TINCTURE PRP APPL 2/3 (GAUZE/BANDAGES/DRESSINGS) ×2 IMPLANT
BLADE SURG 11 STRL SS (BLADE) ×2 IMPLANT
BUR CUTTER 7.0 ROUND (BURR) ×2 IMPLANT
BUR MATCHSTICK NEURO 3.0 LAGG (BURR) ×2 IMPLANT
CANISTER SUCT 3000ML PPV (MISCELLANEOUS) ×2 IMPLANT
CARTRIDGE OIL MAESTRO DRILL (MISCELLANEOUS) ×1 IMPLANT
COVER WAND RF STERILE (DRAPES) ×2 IMPLANT
DERMABOND ADVANCED (GAUZE/BANDAGES/DRESSINGS) ×1
DERMABOND ADVANCED .7 DNX12 (GAUZE/BANDAGES/DRESSINGS) ×1 IMPLANT
DIFFUSER DRILL AIR PNEUMATIC (MISCELLANEOUS) ×2 IMPLANT
DRAPE HALF SHEET 40X57 (DRAPES) IMPLANT
DRAPE LAPAROTOMY 100X72X124 (DRAPES) ×2 IMPLANT
DRAPE MICROSCOPE LEICA (MISCELLANEOUS) ×2 IMPLANT
DRAPE SURG 17X23 STRL (DRAPES) ×2 IMPLANT
DRSG OPSITE 4X5.5 SM (GAUZE/BANDAGES/DRESSINGS) ×1 IMPLANT
DRSG OPSITE POSTOP 4X6 (GAUZE/BANDAGES/DRESSINGS) ×1 IMPLANT
DURAPREP 26ML APPLICATOR (WOUND CARE) ×2 IMPLANT
ELECT REM PT RETURN 9FT ADLT (ELECTROSURGICAL) ×2
ELECTRODE REM PT RTRN 9FT ADLT (ELECTROSURGICAL) ×1 IMPLANT
EVACUATOR 1/8 PVC DRAIN (DRAIN) ×1 IMPLANT
GAUZE SPONGE 4X4 12PLY STRL (GAUZE/BANDAGES/DRESSINGS) ×2 IMPLANT
GAUZE SPONGE 4X4 16PLY XRAY LF (GAUZE/BANDAGES/DRESSINGS) ×1 IMPLANT
GLOVE BIO SURGEON STRL SZ7 (GLOVE) IMPLANT
GLOVE BIO SURGEON STRL SZ8 (GLOVE) ×2 IMPLANT
GLOVE BIOGEL PI IND STRL 7.0 (GLOVE) IMPLANT
GLOVE BIOGEL PI IND STRL 7.5 (GLOVE) IMPLANT
GLOVE BIOGEL PI INDICATOR 7.0 (GLOVE) ×4
GLOVE BIOGEL PI INDICATOR 7.5 (GLOVE) ×3
GLOVE ECLIPSE 7.5 STRL STRAW (GLOVE) ×1 IMPLANT
GLOVE INDICATOR 8.5 STRL (GLOVE) ×2 IMPLANT
GLOVE SS N UNI LF 6.5 STRL (GLOVE) ×3 IMPLANT
GLOVE SS N UNI LF 7.0 STRL (GLOVE) ×3 IMPLANT
GOWN STRL REUS W/ TWL LRG LVL3 (GOWN DISPOSABLE) ×1 IMPLANT
GOWN STRL REUS W/ TWL XL LVL3 (GOWN DISPOSABLE) ×2 IMPLANT
GOWN STRL REUS W/TWL 2XL LVL3 (GOWN DISPOSABLE) IMPLANT
GOWN STRL REUS W/TWL LRG LVL3 (GOWN DISPOSABLE) ×4
GOWN STRL REUS W/TWL XL LVL3 (GOWN DISPOSABLE) ×2
KIT BASIN OR (CUSTOM PROCEDURE TRAY) ×2 IMPLANT
KIT TURNOVER KIT B (KITS) ×2 IMPLANT
NDL SPNL 18GX3.5 QUINCKE PK (NEEDLE) IMPLANT
NDL SPNL 22GX3.5 QUINCKE BK (NEEDLE) ×1 IMPLANT
NEEDLE HYPO 22GX1.5 SAFETY (NEEDLE) ×2 IMPLANT
NEEDLE SPNL 18GX3.5 QUINCKE PK (NEEDLE) ×2 IMPLANT
NEEDLE SPNL 22GX3.5 QUINCKE BK (NEEDLE) ×2 IMPLANT
NS IRRIG 1000ML POUR BTL (IV SOLUTION) ×2 IMPLANT
OIL CARTRIDGE MAESTRO DRILL (MISCELLANEOUS) ×2
PACK LAMINECTOMY NEURO (CUSTOM PROCEDURE TRAY) ×2 IMPLANT
RUBBERBAND STERILE (MISCELLANEOUS) ×4 IMPLANT
SPONGE SURGIFOAM ABS GEL SZ50 (HEMOSTASIS) ×2 IMPLANT
STRIP CLOSURE SKIN 1/2X4 (GAUZE/BANDAGES/DRESSINGS) ×2 IMPLANT
SUT VIC AB 0 CT1 18XCR BRD8 (SUTURE) ×1 IMPLANT
SUT VIC AB 0 CT1 8-18 (SUTURE) ×2
SUT VIC AB 2-0 CT1 18 (SUTURE) ×2 IMPLANT
SUT VICRYL 4-0 PS2 18IN ABS (SUTURE) ×2 IMPLANT
TOWEL GREEN STERILE (TOWEL DISPOSABLE) ×2 IMPLANT
TOWEL GREEN STERILE FF (TOWEL DISPOSABLE) ×2 IMPLANT
WATER STERILE IRR 1000ML POUR (IV SOLUTION) ×2 IMPLANT

## 2019-01-02 NOTE — H&P (Signed)
Paul Pineda is an 73 y.o. male.   Chief Complaint: Back left leg pain and weakness HPI: 73 year old gentleman who 2 weeks ago was working extensively cleaning cars and washing boats and started sneezing severe back pain then had an episode where his left leg gave way his back pain and leg pain is gotten better however his left leg is remained weak he has been nonambulatory now for 2 weeks.  He is got significant weakness in left lower extremity on exam work-up revealed very large disc radiation L2-3 causing severe thecal sac compression and spinal stenosis as well as moderate lateral recess gnosis L3-4.  Due to patient progression of clinical syndrome imaging findings and failed conservative treatment I recommended lumbar laminectomy microdiscectomy L2-3 mostly from the left but may extend over the right and then left side decompression L3-4.  I extensively gone over the risks and benefits of the procedure with him as well as perioperative course expectations of outcome and alternatives of surgery and he understands and agrees to proceed forward.  Past Medical History:  Diagnosis Date  . Arthritis   . Cancer (HCC)    chest lump ,scrotum  . Cataract    right  . Heart murmur   . HOH (hard of hearing)   . Hyperlipidemia   . Hypertension   . Left bundle branch block   . Pneumonia    hx  . Psoriasis     Past Surgical History:  Procedure Laterality Date  . CARDIAC CATHETERIZATION  10/11/2009   no sign CAD, EF 45-50%  . COLONOSCOPY    . INGUINAL HERNIA REPAIR Right 04/29/2013   Procedure: HERNIA REPAIR INGUINAL ADULT;  Surgeon: Ralene Ok, MD;  Location: [place];  Service: General;  Laterality: Right;  . INSERTION OF MESH Right 04/29/2013   Procedure: INSERTION OF MESH;  Surgeon: Ralene Ok, MD;  Location: [place];  Service: General;  Laterality: Right;  . lumps     scrotum, chest   . TOTAL KNEE ARTHROPLASTY Right 07/14/2013   Procedure: RIGHT TOTAL KNEE ARTHROPLASTY;  Surgeon: Gearlean Alf, MD;  Location: [facility];  Service: Orthopedics;  Laterality: Right;    Family History  Problem Relation Age of Onset  . Cancer Father        brain  . Psoriasis Sister   . Psoriasis Brother    Social History:  reports that he quit smoking about 33 years ago. His smoking use included cigarettes. He has a 20.00 pack-year smoking history. He has never used smokeless tobacco. He reports previous alcohol use of about 14.0 standard drinks of alcohol per week. He reports that he does not use drugs.  Allergies: No Known Allergies  Medications Prior to Admission  Medication Sig Dispense Refill  . docusate sodium 100 MG CAPS Take 100 mg by mouth 2 (two) times daily as needed for mild constipation. 60 capsule 0  . hydrOXYzine (ATARAX/VISTARIL) 25 MG tablet Take 1 tablet (25 mg total) by mouth every 6 (six) hours as needed for itching. (Patient taking differently: Take 25 mg by mouth every 8 (eight) hours as needed for itching. ) 12 tablet 0  . lisinopril (PRINIVIL,ZESTRIL) 10 MG tablet Take 10 mg by mouth daily.    Marland Kitchen LORazepam (ATIVAN) 0.5 MG tablet Take 0.5 mg by mouth 2 (two) times daily.    . metoprolol tartrate (LOPRESSOR) 25 MG tablet Take 1 tablet (25 mg total) by mouth 2 (two) times daily. 60 tablet 3  . Multiple Vitamins-Minerals (CENTRUM  SILVER 50+MEN) TABS Take 1 tablet by mouth daily.    Marland Kitchen tiZANidine (ZANAFLEX) 4 MG capsule Take 4 mg by mouth 3 (three) times daily as needed for muscle spasms.    Marland Kitchen triamcinolone cream (KENALOG) 0.1 % Apply 1 application topically 2 (two) times daily.    Marland Kitchen lisinopril (PRINIVIL,ZESTRIL) 5 MG tablet Take 1 tablet (5 mg total) by mouth daily. (Patient not taking: Reported on 07/26/2018) 30 tablet 3  . traMADol (ULTRAM) 50 MG tablet Take 1-2 tablets (50-100 mg total) by mouth every 6 (six) hours as needed (mild pain). (Patient not taking: Reported on 07/26/2018) 60 tablet 1    Results for orders placed or performed during the hospital encounter of  01/02/19 (from the past 48 hour(s))  SARS Coronavirus 2 (CEPHEID - Performed in [place] hospital lab), Hosp Order     Status: None   Collection Time: 01/02/19 11:18 AM  Result Value Ref Range   SARS Coronavirus 2 NEGATIVE NEGATIVE    Comment: (NOTE) If result is NEGATIVE SARS-CoV-2 target nucleic acids are NOT DETECTED. The SARS-CoV-2 RNA is generally detectable in upper and lower  respiratory specimens during the acute phase of infection. The lowest  concentration of SARS-CoV-2 viral copies this assay can detect is 250  copies / mL. A negative result does not preclude SARS-CoV-2 infection  and should not be used as the sole basis for treatment or other  patient management decisions.  A negative result may occur with  improper specimen collection / handling, submission of specimen other  than nasopharyngeal swab, presence of viral mutation(s) within the  areas targeted by this assay, and inadequate number of viral copies  (<250 copies / mL). A negative result must be combined with clinical  observations, patient history, and epidemiological information. If result is POSITIVE SARS-CoV-2 target nucleic acids are DETECTED. The SARS-CoV-2 RNA is generally detectable in upper and lower  respiratory specimens dur ing the acute phase of infection.  Positive  results are indicative of active infection with SARS-CoV-2.  Clinical  correlation with patient history and other diagnostic information is  necessary to determine patient infection status.  Positive results do  not rule out bacterial infection or co-infection with other viruses. If result is PRESUMPTIVE POSTIVE SARS-CoV-2 nucleic acids MAY BE PRESENT.   A presumptive positive result was obtained on the submitted specimen  and confirmed on repeat testing.  While 2019 novel coronavirus  (SARS-CoV-2) nucleic acids may be present in the submitted sample  additional confirmatory testing may be necessary for epidemiological  and / or  clinical management purposes  to differentiate between  SARS-CoV-2 and other Sarbecovirus currently known to infect humans.  If clinically indicated additional testing with an alternate test  methodology [phone]) is advised. The SARS-CoV-2 RNA is generally  detectable in upper and lower respiratory sp ecimens during the acute  phase of infection. The expected result is Negative. Fact Sheet for Patients:  StrictlyIdeas.no Fact Sheet for Healthcare Providers: BankingDealers.co.za This test is not yet approved or cleared by the [place] FDA and has been authorized for detection and/or diagnosis of SARS-CoV-2 by FDA under an Emergency Use Authorization (EUA).  This EUA will remain in effect (meaning this test can be used) for the duration of the COVID-19 declaration under Section 564(b)(1) of the Act, 21 U.S.C. section 360bbb-3(b)(1), unless the authorization is terminated or revoked sooner. Performed at [facility], [place] [address] [place], [place] [address]    No results found.  Review  of Systems  Musculoskeletal: Positive for back pain and joint pain.  Neurological: Positive for tingling and focal weakness.    Blood pressure (!) 169/91, pulse 71, temperature 98.5 F (36.9 C), temperature source Oral, resp. rate 18, height 5\' 10"  (1.778 m), weight 98.4 kg, SpO2 100 %. Physical Exam  Constitutional: He is oriented to person, place, and time. He appears well-developed.  HENT:  Head: Normocephalic.  Eyes: Pupils are equal, round, and reactive to light.  Neck: Normal range of motion.  Respiratory: Effort normal.  GI: Soft.  Musculoskeletal: Normal range of motion.  Neurological: He is alert and oriented to person, place, and time. He has normal strength. GCS eye subscore is 4. GCS verbal subscore is 5. GCS motor subscore is 6.  Patient is awake and alert right lower extremity is 5 out of 5 iliopsoas, quads,  hamstrings, gastroc, and tibialis, EHL.  Left lower extremity has significant weakness proximally 4-5 iliopsoas 4-5 quadricep  Skin: Skin is warm and dry.     Assessment/Plan 73 year old presents for decompression discectomy L2-3 and L3-4  Jayde Daffin P, MD 01/02/2019, 2:27 PM

## 2019-01-02 NOTE — Anesthesia Postprocedure Evaluation (Signed)
Anesthesia Post Note  Patient: Paul Pineda  Procedure(s) Performed: Microdiscectomy - L2-L3 - DLL L3-L4 - left LUMBAR TWO-THREE MICRODISCETOMY WITH LEFT LUMBAR THREE-FOUR DECOMPRESSIVE LUMBAR DECOMPRESSIVE LAMINECTOMY (Left Spine Lumbar)     Patient location during evaluation: PACU Anesthesia Type: General Level of consciousness: awake and alert Pain management: pain level controlled Vital Signs Assessment: post-procedure vital signs reviewed and stable Respiratory status: spontaneous breathing, nonlabored ventilation, respiratory function stable and patient connected to nasal cannula oxygen Cardiovascular status: blood pressure returned to baseline and stable Postop Assessment: no apparent nausea or vomiting Anesthetic complications: no    Last Vitals:  Vitals:   01/02/19 1804 01/02/19 1819  BP: (!) 148/86 (!) 156/90  Pulse: 66 70  Resp: 15 14  Temp:    SpO2: 100% 100%    Last Pain:  Vitals:   01/02/19 1815  TempSrc:   PainSc: 4                  Marlyn Tondreau COKER

## 2019-01-02 NOTE — Anesthesia Procedure Notes (Signed)
Procedure Name: Intubation Date/Time: 01/02/2019 2:47 PM Performed by: Inda Coke, CRNA Pre-anesthesia Checklist: Patient identified, Emergency Drugs available, Suction available and Patient being monitored Patient Re-evaluated:Patient Re-evaluated prior to induction Oxygen Delivery Method: Circle System Utilized Preoxygenation: Pre-oxygenation with 100% oxygen Induction Type: IV induction and Rapid sequence Laryngoscope Size: Mac and 4 Grade View: Grade I Tube type: Oral Tube size: 7.5 mm Number of attempts: 1 Airway Equipment and Method: Stylet and Oral airway Placement Confirmation: ETT inserted through vocal cords under direct vision,  positive ETCO2 and breath sounds checked- equal and bilateral Secured at: 22 cm Tube secured with: Tape Dental Injury: Teeth and Oropharynx as per pre-operative assessment

## 2019-01-02 NOTE — Transfer of Care (Signed)
Immediate Anesthesia Transfer of Care Note  Patient: Paul Pineda  Procedure(s) Performed: Microdiscectomy - L2-L3 - DLL L3-L4 - left LUMBAR TWO-THREE MICRODISCETOMY WITH LEFT LUMBAR THREE-FOUR DECOMPRESSIVE LUMBAR DECOMPRESSIVE LAMINECTOMY (Left Spine Lumbar)  Patient Location: PACU  Anesthesia Type:General  Level of Consciousness: awake, alert  and oriented  Airway & Oxygen Therapy: Patient Spontanous Breathing and Patient connected to nasal cannula oxygen  Post-op Assessment: Report given to RN and Post -op Vital signs reviewed and stable  Post vital signs: Reviewed and stable  Last Vitals:  Vitals Value Taken Time  BP 156/84 01/02/2019  5:04 PM  Temp    Pulse 79 01/02/2019  5:06 PM  Resp 14 01/02/2019  5:06 PM  SpO2 99 % 01/02/2019  5:06 PM  Vitals shown include unvalidated device data.  Last Pain:  Vitals:   01/02/19 1251  TempSrc:   PainSc: 2       Patients Stated Pain Goal: 3 (81/85/90 9311)  Complications: No apparent anesthesia complications

## 2019-01-02 NOTE — Op Note (Signed)
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  Left-sided decompressive laminotomy L3-4 with microscopic dissection of the left L4 nerve root with partial medial facetectomy and foraminotomy  Surgeon: Dominica Severin Verta Riedlinger  Assistant: Newman Pies  Anesthesia: General  EBL: Minimal  HPI: 73 year old gentleman presented with 2 weeks of inability to ambulate weakness in his left leg and imaging showing a very large herniation L2-3 and severe spinal stenosis on the left at L3-4.  Due to patient progression of clinical syndrome imaging findings and failed conservative treatment I recommended discectomy decompression L2-3 and decompressive laminotomy on the left at L3-4.  I extensively went over the risks and benefits of the operation with the patient as well as perioperative course expectations of outcome and alternatives to surgery and he understood and agreed to proceed forward.  Operative procedure: Patient brought into the OR was used under general anesthesia positioned prone Wilson frame his back was prepped and draped in routine sterile fashion preoperative x-ray localized the appropriate level and after infiltration of 10 cc lidocaine with epi midline incision was made and Bovie elect cautery was used to take down subtenons tissue and subperiosteal dissection carried lamina of L2-L3 bilaterally and L3-4 on the left.  Self-retaining retractor was placed and intraoperative x-ray confirmed the appropriate level.  So initially started laminotomy and left-sided drilled down some of the lateral aspect the spinous process drilled up underneath the spinous process and lamina and performed a sublaminar decompression exposing the thecal  sac removed the ligamentum flavum in piecemeal fashion and immediately the disclamation presented on the dorsal aspect of thecal sac.  So at this point I draped the operating microscope and under microscopic lamination I remove several large fragments with a dorsal aspect of thecal sac tracking both to the patient's right side as well as left side.  First I continue the sublaminar decompression at looked across the midline over on the right and remove several fragments of disc from the right lateral aspect of the canal in addition removed several other fragments from the left aspect and this looks like it completely decompress the thecal sac.  Then I further under bit the medial facet complex on the left at L2-3 identified the left L3 pedicle and the left to 3 to space this was bulging I did incise it cleaned out several fragments of disc from the displaced compartment and centrally.  At the end discectomy I was able to easily pass a coronary dilator all the way across the midline without resistance as well as a hockey stick out the foramen and both medially and laterally and on the thecal sac superiorly and inferiorly.  I then reexplored the contralateral side through the sublaminar decompression did not appreciate any more fragments.  Thecal sac appeared widely decompressed.  I packed with Gelfoam and then re-shifted and reposition the retractor down L3-4 performed laminotomy at L3-4 under microscopic illumination under but the medial facet complex identified the L4 pedicle and performed foraminotomies the L4 nerve root and under bit the medial facet complex to decompress the lateral aspect of the canal.  At the end decompression was no further stenosis at either level no further disc fragments appreciated was copiously irrigated meticulous hemostasis was maintained medium Hemovac drain was placed and the wound was closed in layers with Vicryl  skin was closed running 4 subcuticular Dermabond benzoin Steri-Strips  and a sterile dressing was applied patient recovery in stable condition.  At the end the case all needle count sponge counts were correct.

## 2019-01-03 ENCOUNTER — Encounter (HOSPITAL_COMMUNITY): Payer: Self-pay | Admitting: Neurosurgery

## 2019-01-03 ENCOUNTER — Telehealth: Payer: Medicare Other | Admitting: Neurology

## 2019-01-03 MED ORDER — TIZANIDINE HCL 2 MG PO TABS: 2.0000 mg | ORAL_TABLET | Freq: Four times a day (QID) | ORAL | 0 refills | Status: DC | PRN

## 2019-01-03 MED ORDER — HYDROCODONE-ACETAMINOPHEN 5-325 MG PO TABS: 1.0000 | ORAL_TABLET | ORAL | 0 refills | Status: DC | PRN

## 2019-01-03 NOTE — Evaluation (Addendum)
Physical Therapy Evaluation Patient Details Name: Paul Pineda MRN: [id number] DOB: Feb 23, 1946 Today's Date: 01/03/2019   History of Present Illness  Patient is a 73 y/o male s/p Lumbar laminectomy microdiscectomy L2-3 L3-4 on 01/02/2019. Patient with a PMH significant of HTN, arthritis, LBBB.     Clinical Impression  Patient admitted s/p above listed procedure. Patient reports that prior to admission he had been receiving [place] services (therapy, aide, RN) who assisted with mobility and ADLs. Patient ambulating with RW, gait belt and non-slip socks from room to elevator with overall min assist for safety and stability. With patient reporting poor attention to task, patient demonstrating B knee buckle with near fall. Patient requiring total A +3 to sit at rollator provided by aide. Patient returned to room via sitting on rollator with nursing staff immediately notified. Vital assessed and stable. Ice applied to low back as he had just received pain medications per RN.   Lengthy discussion on home safety and PT recommendations for possible CIR vs staying another day to again work with therapy staff due to high risk of falls  - patient wishing to return home. Patient states he plans to remain at bed level and transfer to [facility] until [facility] PT/OT resumes on Monday. PT stressing importance of safety and recommendations. Will need max HH services at discharge if CIR is not an option and will require PTAR transport home.    Follow Up Recommendations  CIR; 24 hr supervision    Equipment Recommendations  None recommended by PT    Recommendations for Other Services       Precautions / Restrictions Precautions Precautions: Fall;Back Precaution Booklet Issued: Yes (comment) Precaution Comments: handout given with review of precautions Spinal Brace: (no brace needed per orders) Restrictions Weight Bearing Restrictions: No      Mobility  Bed Mobility Overal bed mobility: Needs Assistance Bed Mobility:  Rolling;Sidelying to Sit Rolling: Min guard Sidelying to sit: Min guard       General bed mobility comments: increased time and effort; use of bed rails; cueing for sequencing  Transfers Overall transfer level: Needs assistance Equipment used: Rolling walker (2 wheeled) Transfers: Sit to/from Stand Sit to Stand: Mod assist         General transfer comment: patient pulling up at RW (states this is his baseline) - requires Mod A with support at RW to remain upright  Ambulation/Gait Ambulation/Gait assistance: Min assist Gait Distance (Feet): 120 Feet Assistive device: Rolling walker (2 wheeled) Gait Pattern/deviations: Step-through pattern;Decreased stride length;Trunk flexed Gait velocity: decreased   General Gait Details: patient ambulating with RW - step through pattern from room to elevator; with reduced attention to mobility patient with B knee buckle requiring total A+3 to sit on rollator to prevent fall to floor  Stairs            Wheelchair Mobility    Modified Rankin (Stroke Patients Only)       Balance Overall balance assessment: Needs assistance;History of Falls Sitting-balance support: Feet supported;No upper extremity supported Sitting balance-Leahy Scale: Good     Standing balance support: Bilateral upper extremity supported;During functional activity Standing balance-Leahy Scale: Poor Standing balance comment: reliant on UEs                              Pertinent Vitals/Pain Pain Assessment: 0-10 Pain Score: 6  Pain Location: back Pain Descriptors / Indicators: Aching;Discomfort;Guarding Pain Intervention(s): Limited activity within patient's tolerance;Monitored during session;Premedicated  before session;Repositioned;Ice applied    Home Living Family/patient expects to be discharged to:: Private residence Living Arrangements: Spouse/significant other;Children Available Help at Discharge: Friend(s);Available 24 hours/day Type of  Home: House Home Access: Stairs to enter Entrance Stairs-Rails: None Entrance Stairs-Number of Steps: 3 Home Layout: One level Home Equipment: Grab bars - toilet;Tub bench;Hand held shower head;Walker - 2 wheels;Transport chair(regular w/c on order)      Prior Function Level of Independence: Needs assistance   Gait / Transfers Assistance Needed: ambulating with RW vs transport chair  ADL's / Homemaking Assistance Needed: has been receiving assist for ADLs; HH therapies and RN  Comments: pateitn reporting falls nearly everyday due to back pain     Hand Dominance        Extremity/Trunk Assessment   Upper Extremity Assessment Upper Extremity Assessment: Defer to OT evaluation    Lower Extremity Assessment Lower Extremity Assessment: Generalized weakness    Cervical / Trunk Assessment Cervical / Trunk Assessment: Kyphotic  Communication   Communication: HOH  Cognition Arousal/Alertness: Awake/alert Behavior During Therapy: WFL for tasks assessed/performed Overall Cognitive Status: Within Functional Limits for tasks assessed                                        General Comments General comments (skin integrity, edema, etc.): VSS throughout session and after near fall    Exercises     Assessment/Plan    PT Assessment Patient needs continued PT services  PT Problem List Decreased strength;Decreased activity tolerance;Decreased mobility;Decreased balance;Decreased knowledge of use of DME;Decreased safety awareness;Decreased knowledge of precautions       PT Treatment Interventions DME instruction;Gait training;Stair training;Functional mobility training;Therapeutic activities;Balance training;Therapeutic exercise;Patient/family education    PT Goals (Current goals can be found in the Care Plan section)  Acute Rehab PT Goals Patient Stated Goal: go home today PT Goal Formulation: With patient Time For Goal Achievement: 01/17/19 Potential to  Achieve Goals: Good    Frequency Min 5X/week   Barriers to discharge        Co-evaluation PT/OT/SLP Co-Evaluation/Treatment: Yes Reason for Co-Treatment: For patient/therapist safety;To address functional/ADL transfers PT goals addressed during session: Mobility/safety with mobility;Proper use of DME;Balance;Strengthening/ROM         AM-PAC PT "6 Clicks" Mobility  Outcome Measure Help needed turning from your back to your side while in a flat bed without using bedrails?: A Little Help needed moving from lying on your back to sitting on the side of a flat bed without using bedrails?: A Little Help needed moving to and from a bed to a chair (including a wheelchair)?: A Little Help needed standing up from a chair using your arms (e.g., wheelchair or bedside chair)?: A Little Help needed to walk in hospital room?: A Little Help needed climbing 3-5 steps with a railing? : Total 6 Click Score: 16    End of Session Equipment Utilized During Treatment: Gait belt Activity Tolerance: Patient tolerated treatment well;Patient limited by pain Patient left: in chair;with call bell/phone within reach Nurse Communication: Mobility status PT Visit Diagnosis: Unsteadiness on feet (R26.81);Other abnormalities of gait and mobility (R26.89);Muscle weakness (generalized) (M62.81);History of falling (Z91.81);Pain Pain - part of body: (back)    Time: 4034-7425 PT Time Calculation (min) (ACUTE ONLY): 32 min   Charges:   PT Evaluation $PT Eval Moderate Complexity: 1 Mod          Lanney Gins, PT,  DPT Supplemental Physical Therapist 01/03/19 12:05 PM Pager: [phone] Office: [phone]

## 2019-01-03 NOTE — Social Work (Signed)
CSW acknowledging consult for SNF placement. Therapy recommendations currently for [facility], pt desires to go home today, [facility] aware.   Westley Hummer, MSW, [place] Work [phone]

## 2019-01-03 NOTE — TOC Transition Note (Addendum)
Transition of Care [facility]) - CM/SW Discharge Note   Patient Details  Name: Paul Pineda MRN: [id number] Date of Birth: [birth year]  Transition of Care [facility]) CM/SW Contact:  Ninfa Meeker, RN Phone Number: [phone] (working remotely) 01/03/2019, 1:51 PM   Clinical Narrative:   73 yr old gentleman s/p L2-3,3-4 Lumbar laminectomy, microdiscectomy. Case manager spoke with patient via telephone to discuss discharge plan. Patient is aware that therapist feel he is not ready to discharge, but he is adamant that he is going home today. Patient says his daughters are here and he wants to go home to see them. Patient says he already has aides coming in several days a week, CM discussed need for therapist as well. Patient wants to continue with [facility] for therapy. CM called referral to Glyn Ade, [facility] Liaison. Patient will see therapist another time this afternoon and bedside RN will contact CM to arrange for PTAR. Medical Necessity form and demographics have been printed to the unit and placed on the chart by unit secretary..    Final next level of care: [place] Barriers to Discharge: No Barriers Identified   Patient Goals and CMS Choice Patient states their goals for this hospitalization and ongoing recovery are:: get stronger, not fall CMS Medicare.gov Compare Post Acute Care list provided to:: Patient(patient already has agency involved) Choice offered to / list presented to : Patient  Discharge Placement                       Discharge Plan and Services   Discharge Planning Services: CM Consult Post Acute Care Choice: Home Health, Durable Medical Equipment          DME Arranged: Wheelchair manual DME Agency: [place] Date DME Agency Contacted: 01/03/19 Time DME Agency Contacted: 1443 Representative spoke with at DME Agency: Learta Codding HH Arranged: PT, OT [facility] Agency: [facility] Date [place]:  01/03/19 Time [place]: 2 Representative spoke with at [place]: [place] (SDOH) Interventions     Readmission Risk Interventions No flowsheet data found.

## 2019-01-03 NOTE — Care Management (Signed)
Case manager received call from bedside RN with update that patient will not discharge until tomorrow. CM will followup.   Ricki Miller, RN Case Manager [phone]

## 2019-01-03 NOTE — Progress Notes (Addendum)
Per Margo Aye, NP ordered to walk pt since PT unable to work with pt. RN and NT at bedside to walk with pt and pt stated "I am to weak and my back has locked up". RN informed pt of what medications were available for him to take to decrease his pain. Pt refused and stated "I do not mix pain medications because it makes me nauseous so I will wait until my pain medication is due".  Pt then informed me that "It is probably unsafe for me to go home right now since my back has locked up". Pt has now agreed to stay one more night as discussed previously and to allow PT to reassess in the morning. Margo Aye, NP and Manuela Schwartz, [place] notified. Will continue to monitor.

## 2019-01-03 NOTE — Anesthesia Postprocedure Evaluation (Signed)
Anesthesia Post Note  Patient: Paul Pineda  Procedure(s) Performed: Microdiscectomy - L2-L3 - DLL L3-L4 - left LUMBAR TWO-THREE MICRODISCETOMY WITH LEFT LUMBAR THREE-FOUR DECOMPRESSIVE LUMBAR DECOMPRESSIVE LAMINECTOMY (Left Spine Lumbar)     Anesthesia Post Evaluation  Last Vitals:  Vitals:   01/03/19 0312 01/03/19 0758  BP: 139/83 (!) 142/86  Pulse: 68 75  Resp: 16   Temp: 36.7 C 36.9 C  SpO2: 97% 100%    Last Pain:  Vitals:   01/03/19 0758  TempSrc: Oral  PainSc:                  Zsofia Prout COKER

## 2019-01-03 NOTE — Progress Notes (Signed)
Per physical therapist pt knees buckled while ambulating.  It was stated that pt did not hit the floor and was placed on a rollator and returned back to his room.  PT asked for RN to notify on call provider. Margo Aye, NP notified and stated that pt should stay one more night to allow for PT to assess him again in the morning.

## 2019-01-03 NOTE — TOC Progression Note (Addendum)
Transition of Care [facility]) - Progression Note    Patient Details  Name: Paul Pineda MRN: [id number] Date of Birth: 11/05/1945  Transition of Care [facility]) CM/SW Contact  Bartholomew Crews, RN Phone Number: [phone] 01/03/2019, 2:15 PM  Clinical Narrative:    Received call from patient's spouse, Arbie Cookey, asking about how patient was doing and discharge plans. Advised that PT and OT were arranged with [facility], and that they typically follow up within 24 hours of discharge. Advised that wheelchair had been ordered through [place]. Mrs. Grable expressed concerns about patient's weakness and his legs buckling with therapy earlier. She states that she has someone to help her at home through Sunday, but then she is on her own. She requests PTAR for transport home. Spoke with lead CM who stated that wheelchair is being delivered to the home, and patient had already advised that he would need PTAR transportation. Address verified in Epic to be correct.   Update: Spoke with wife and daughter about [employer] delivering the wheelchair. They both had a lot of questions about caring for him at home and how weak he is. Discussed rehabilitation options after discharge, and also that PT initially recommended inpatient rehabilitation. Wife states he has her support at home at discharge, but feels he needs more therapy before coming home today. Wife and daughter to speak to patient on the phone to discuss their concerns. Daughter also stated that patient has had a recent flare of his psoriasis and had been prescribed ointments which they have picked.   They asked if he could receive these medications. Triamcinolone acetenide 0.1% BID all over body and desonide 0.05% twice a day on face, neck, head. He was also to take 40 mg of prednisone x 5 days, but only received 2 days before coming to the hospital.   [facility] notified of possible change in plans.   Received another call from wife and daughter stating that patient  actually called them stating he doesn't feel well and doesn't think he should be going home right now. Family again requested that he receive his psoriasis medications.   CM to follow for transition of care needs.   Expected Discharge Plan: [place] Barriers to Discharge: No Barriers Identified  Expected Discharge Plan and Services Expected Discharge Plan: [place]   Discharge Planning Services: CM Consult Post Acute Care Choice: Home Health, Durable Medical Equipment Living arrangements for the past 2 months: Single Family Home Expected Discharge Date: 01/03/19               DME Arranged: Wheelchair manual DME Agency: [place] Date DME Agency Contacted: 01/03/19 Time DME Agency Contacted: 2229 Representative spoke with at DME Agency: Learta Codding HH Arranged: PT, OT [facility] Agency: [facility] Date [place]: 01/03/19 Time [place]: 1325 Representative spoke with at [place]: [place] (SDOH) Interventions    Readmission Risk Interventions No flowsheet data found.

## 2019-01-03 NOTE — Progress Notes (Signed)
Patient requesting to restart some prior meds, but said he "is okay for tonight", if we could get them restarted Saturday morning:  Triamcinolone acetenide 0.1% BID all over body desonide 0.05% twice a day on face, neck, head.  40 mg of prednisone x 3 more days And prn stool softener

## 2019-01-03 NOTE — Discharge Summary (Addendum)
Physician Discharge Summary  Patient ID: Paul Pineda MRN: [id number] DOB/AGE: 11/07/1945 73 y.o.  Admit date: 01/02/2019 Discharge date: 01/07/2019  Admission Diagnoses: Herniated nucleus pulposus lumbar spinal stenosis L2-3 L3-4  Discharge Diagnoses: Same Active Problems:   HNP (herniated nucleus pulposus), lumbar   Lumbar herniated disc   Discharged Condition: good  Hospital Course: Patient is admitted to the hospital underwent lumbar laminectomy microdiscectomy L2-3 and a decompression L3-4 postoperative patient did very well significant provement in leg strength postoperatively no leg pain just incisional pain.  Patient will be discharged he already has home health and home physical therapy arranged will confirm with the caseworker and social worker this morning with physical therapy and get him whenever equipment he may need.  Consults:   Significant Diagnostic Studies: {diagnostics:[id number]  Treatments: Lumbar laminectomy microdiscectomy L2-3 L3-4  Discharge Exam: Blood pressure (!) 141/83, pulse 100, temperature 98.5 F (36.9 C), temperature source Oral, resp. rate 20, height 5\' 10"  (1.778 m), weight 98.4 kg, SpO2 95 %. Awake and alert strength 4+ out of 5 iliopsoas 4 to 4+ out of 5 quadricep left home  Disposition: Discharge disposition: 01-Home or Self Care       Discharge Instructions    Call MD for:  difficulty breathing, headache or visual disturbances   Complete by:  As directed    Call MD for:  hives   Complete by:  As directed    Call MD for:  persistant dizziness or light-headedness   Complete by:  As directed    Call MD for:  persistant nausea and vomiting   Complete by:  As directed    Call MD for:  redness, tenderness, or signs of infection (pain, swelling, redness, odor or green/yellow discharge around incision site)   Complete by:  As directed    Call MD for:  severe uncontrolled pain   Complete by:  As directed    Call MD for:  temperature >100.4    Complete by:  As directed    Diet - low sodium heart healthy   Complete by:  As directed    Driving Restrictions   Complete by:  As directed    No driving for 2 weeks, no riding in the car for 1 week   Increase activity slowly   Complete by:  As directed    Lifting restrictions   Complete by:  As directed    No lifting more than 8 lbs     Allergies as of 01/07/2019   No Known Allergies     Medication List    TAKE these medications   Centrum Silver 50+Men Tabs Take 1 tablet by mouth daily.   DSS 100 MG Caps Take 100 mg by mouth 2 (two) times daily as needed for mild constipation.   HYDROcodone-acetaminophen 5-325 MG tablet Commonly known as:  NORCO/VICODIN Take 1 tablet by mouth every 4 (four) hours as needed for moderate pain.   HYDROcodone-acetaminophen 5-325 MG tablet Commonly known as:  NORCO/VICODIN Take 1-2 tablets by mouth every 6 (six) hours as needed for moderate pain.   hydrOXYzine 25 MG tablet Commonly known as:  ATARAX/VISTARIL Take 1 tablet (25 mg total) by mouth every 6 (six) hours as needed for itching. What changed:  when to take this   lisinopril 10 MG tablet Commonly known as:  ZESTRIL Take 10 mg by mouth daily.   lisinopril 5 MG tablet Commonly known as:  ZESTRIL Take 1 tablet (5 mg total) by mouth daily.   LORazepam  0.5 MG tablet Commonly known as:  ATIVAN Take 0.5 mg by mouth 2 (two) times daily.   metoprolol tartrate 25 MG tablet Commonly known as:  LOPRESSOR Take 1 tablet (25 mg total) by mouth 2 (two) times daily.   tiZANidine 4 MG capsule Commonly known as:  ZANAFLEX Take 4 mg by mouth 3 (three) times daily as needed for muscle spasms. What changed:  Another medication with the same name was added. Make sure you understand how and when to take each.   tiZANidine 2 MG tablet Commonly known as:  ZANAFLEX Take 1 tablet (2 mg total) by mouth every 6 (six) hours as needed for muscle spasms. What changed:  You were already taking a  medication with the same name, and this prescription was added. Make sure you understand how and when to take each.   tiZANidine 4 MG tablet Commonly known as:  Zanaflex Take 1 tablet (4 mg total) by mouth 3 (three) times daily. What changed:  You were already taking a medication with the same name, and this prescription was added. Make sure you understand how and when to take each.   traMADol 50 MG tablet Commonly known as:  ULTRAM Take 1-2 tablets (50-100 mg total) by mouth every 6 (six) hours as needed (mild pain).   triamcinolone cream 0.1 % Commonly known as:  KENALOG Apply 1 application topically 2 (two) times daily.            Durable Medical Equipment  (From admission, onward)         Start     Ordered   01/03/19 1432  For home use only DME standard manual wheelchair with seat cushion  Once    Comments:  Patient suffers from back pain which impairs their ability to perform daily activities like grooming in the home.  A walker will not resolve issue with performing activities of daily living. A wheelchair will allow patient to safely perform daily activities. Patient can safely propel the wheelchair in the home or has a caregiver who can provide assistance. Length of need 6 months . Accessories: elevating leg rests (ELRs), wheel locks, extensions and anti-tippers.   01/03/19 1432   01/03/19 1412  For home use only DME standard manual wheelchair with seat cushion  Once    Comments:  Patient suffers from Lumbar Laminectomy which impairs their ability to perform daily activities like ambulating in the home.  A walker will not resolve issue with performing activities of daily living. A wheelchair will allow patient to safely perform daily activities. Patient can safely propel the wheelchair in the home or has a caregiver who can provide assistance. Length of need 2-3 months. Accessories: elevating leg rests (ELRs), wheel locks, extensions and anti-tippers.   01/03/19 1415          Follow-up Information    [facility] Follow up.   Specialty:  Home Health Services Why:  A representative from [facility] will contact you to arrange start date and time for your therapy. Contact information: [address] [place] [address] [phone]           Signed: Ocie Cornfield [facility] 01/07/2019, 9:16 AM

## 2019-01-03 NOTE — Evaluation (Signed)
Occupational Therapy Evaluation Patient Details Name: Paul Pineda MRN: [id number] DOB: Feb 11, 1946 Today's Date: 01/03/2019    History of Present Illness Patient is a 73 y/o male s/p Lumbar laminectomy microdiscectomy L2-3 L3-4 on 01/02/2019. Patient with a PMH significant of HTN, arthritis, LBBB.    Clinical Impression   This 73 yo male admitted and underwent above presents to acute OT with weakness and falls pta and now post surgery. Presents today with decreased knowledge of back precautions, increased pain, decreased safety and independence with basic ADLs as well as a near fall with therapy today with both knees buckling without warning while he was ambulating (he reported it was because he was not focusing on walking (talking and walking at same time) and took too big of a step and that was why he buckled. Feel he will really benefit from CIR due to his history pta of falls and near fall today. He really wants to go home but not safe and would really benefit from CIR to get stronger, safer, and more independent with basic ADLs.    Follow Up Recommendations  CIR;Supervision/Assistance - 24 hour    Equipment Recommendations  None recommended by OT       Precautions / Restrictions Precautions Precautions: Fall;Back Precaution Booklet Issued: Yes (comment) Precaution Comments: handout given with review of precautions Spinal Brace: (no brace needed per order) Restrictions Weight Bearing Restrictions: No      Mobility Bed Mobility Overal bed mobility: Needs Assistance Bed Mobility: Rolling;Sidelying to Sit Rolling: Min guard Sidelying to sit: Min guard Supine to sit: Supervision Sit to supine: Min assist   General bed mobility comments: increased time and effort; use of bed rails; cueing for sequencing  Transfers Overall transfer level: Needs assistance Equipment used: Rolling walker (2 wheeled) Transfers: Sit to/from Stand Sit to Stand: Mod assist         General  transfer comment: patient pulling up at RW (states this is his baseline) - requires Mod A with support at RW to remain upright    Balance Overall balance assessment: Needs assistance;History of Falls Sitting-balance support: Feet supported;No upper extremity supported Sitting balance-Leahy Scale: Good     Standing balance support: Bilateral upper extremity supported;During functional activity Standing balance-Leahy Scale: Poor Standing balance comment: reliant on UEs                            ADL either performed or assessed with clinical judgement   ADL Overall ADL's : Needs assistance/impaired Eating/Feeding: Independent;Sitting   Grooming: Set up;Sitting;Supervision/safety   Upper Body Bathing: Supervision/ safety;Set up;Sitting   Lower Body Bathing: Moderate assistance Lower Body Bathing Details (indicate cue type and reason): with Mod A sit<>stand Upper Body Dressing : Set up;Supervision/safety;Sitting   Lower Body Dressing: Maximal assistance Lower Body Dressing Details (indicate cue type and reason): with Mod A sit<>stand   Toilet Transfer Details (indicate cue type and reason): Mod A sit<>stand, min A once up on feet to ambulate with RW Toileting- Clothing Manipulation and Hygiene: Moderate assistance Toileting - Clothing Manipulation Details (indicate cue type and reason): with Mod A sit<>stand             Vision Patient Visual Report: No change from baseline              Pertinent Vitals/Pain Pain Assessment: 0-10 Pain Score: 6  Pain Location: back Pain Descriptors / Indicators: Aching;Discomfort;Guarding Pain Intervention(s): Limited activity within patient's tolerance;Monitored during session;Premedicated  before session;Repositioned;Ice applied     Hand Dominance Right   Extremity/Trunk Assessment Upper Extremity Assessment Upper Extremity Assessment: Overall WFL for tasks assessed           Communication  Communication Communication: HOH   Cognition Arousal/Alertness: Awake/alert Behavior During Therapy: WFL for tasks assessed/performed Overall Cognitive Status: Within Functional Limits for tasks assessed                                                Home Living Family/patient expects to be discharged to:: Inpatient rehab Living Arrangements: Spouse/significant other;Children Available Help at Discharge: Friend(s);Available 24 hours/day Type of Home: House Home Access: Stairs to enter [employer] of Steps: 3 Entrance Stairs-Rails: None Home Layout: One level     Bathroom Shower/Tub: Walk-in shower;Tub/shower unit   Bathroom Toilet: Standard     Home Equipment: Grab bars - toilet;Tub bench;Hand held shower head;Walker - 2 wheels;Transport chair(regular W/C on order)   Additional Comments: states he has a 3-wheeled walker, denies having grab bars in shower stall--previous PT notes state that therre are grab bars in bathroom/shower      Prior Functioning/Environment Level of Independence: Needs assistance  Gait / Transfers Assistance Needed: ambulating with RW vs transport chair ADL's / Homemaking Assistance Needed: has been receiving assist for ADLs; [facility] and RN   Comments: patient reporting falls nearly everyday due to back pain        OT Problem List: Decreased strength;Decreased range of motion;Impaired balance (sitting and/or standing);Pain;Decreased knowledge of use of DME or AE;Decreased knowledge of precautions      OT Treatment/Interventions: Self-care/ADL training;Balance training;DME and/or AE instruction;Patient/family education    OT Goals(Current goals can be found in the care plan section) Acute Rehab OT Goals Patient Stated Goal: go home today OT Goal Formulation: With patient Time For Goal Achievement: 01/17/19 Potential to Achieve Goals: Good  OT Frequency: Min 2X/week           Co-evaluation PT/OT/SLP  Co-Evaluation/Treatment: Yes Reason for Co-Treatment: For patient/therapist safety;To address functional/ADL transfers PT goals addressed during session: Balance;Mobility/safety with mobility;Proper use of DME;Strengthening/ROM OT goals addressed during session: ADL's and self-care;Strengthening/ROM      AM-PAC OT "6 Clicks" Daily Activity     Outcome Measure Help from another person eating meals?: None Help from another person taking care of personal grooming?: A Little Help from another person toileting, which includes using toliet, bedpan, or urinal?: A Lot Help from another person bathing (including washing, rinsing, drying)?: A Lot Help from another person to put on and taking off regular upper body clothing?: A Little Help from another person to put on and taking off regular lower body clothing?: A Lot 6 Click Score: 16   End of Session Equipment Utilized During Treatment: Gait belt;Rolling walker Nurse Communication: Mobility status;Other (comment)(pt had near fall with therapy )  Activity Tolerance: Patient tolerated treatment well Patient left: in chair;with call bell/phone within reach  OT Visit Diagnosis: Unsteadiness on feet (R26.81);Other abnormalities of gait and mobility (R26.89);Repeated falls (R29.6);Muscle weakness (generalized) (M62.81);History of falling (Z91.81);Pain Pain - part of body: (low baCk)                Time: 6063-0160 OT Time Calculation (min): 32 min Charges:  OT General Charges $OT Visit: 1 Visit OT Evaluation $OT Eval Moderate Complexity: 1 Mod  [place], OTR/L  Acute Rehab Services Pager [phone] Office [phone]     Almon Register 01/03/2019, 3:19 PM

## 2019-01-03 NOTE — Progress Notes (Signed)
Pt refusing to stay another night per Margo Aye. NP orders. Pt states "I need to get home to see my daughters who are visiting."  Pt educated on the importance of staying an extra night to be reassessed by PT.  Margo Aye, NP notified and then asked to see if PT can reassess pt later on today.  Per PT they are unable to reassess pt today.

## 2019-01-04 MED ORDER — SENNA 8.6 MG PO TABS
1.0000 | ORAL_TABLET | Freq: Every day | ORAL | Status: DC
  Administered 2019-01-04 – 2019-01-07 (×4): 8.6 mg via ORAL
  Filled 2019-01-04 (×4): qty 1

## 2019-01-04 MED ORDER — DOCUSATE SODIUM 100 MG PO CAPS
100.0000 mg | ORAL_CAPSULE | Freq: Two times a day (BID) | ORAL | Status: DC
  Administered 2019-01-04 – 2019-01-07 (×7): 100 mg via ORAL
  Filled 2019-01-04 (×7): qty 1

## 2019-01-04 MED ORDER — PANTOPRAZOLE SODIUM 40 MG PO TBEC
40.0000 mg | DELAYED_RELEASE_TABLET | Freq: Every day | ORAL | Status: DC
  Administered 2019-01-04 – 2019-01-06 (×3): 40 mg via ORAL
  Filled 2019-01-04 (×3): qty 1

## 2019-01-04 MED ORDER — DESONIDE 0.05 % EX OINT
TOPICAL_OINTMENT | Freq: Two times a day (BID) | CUTANEOUS | Status: DC
  Administered 2019-01-04 – 2019-01-05 (×3): via TOPICAL
  Administered 2019-01-05: 1 via TOPICAL
  Administered 2019-01-06 – 2019-01-07 (×3): via TOPICAL
  Filled 2019-01-04: qty 15

## 2019-01-04 NOTE — Progress Notes (Signed)
Occupational Therapy Treatment Patient Details Name: Paul Pineda MRN: [id number] DOB: 1945/12/29 Today's Date: 01/04/2019    History of present illness Patient is a 73 y/o male s/p Lumbar laminectomy microdiscectomy L2-3 L3-4 on 01/02/2019. Patient with a PMH significant of HTN, arthritis, LBBB. Pt with recent near fall upon eval 6/5 resulting in BLE weakness and decreased confidence.    OT comments  Pt performing bed mobility with modA overall for siting EOB to supine into long sitting. Pt progressing to use Stedy from recliner to bed with multiple sit to stands with pt squirming to R and L to stand upright. Pt requiring cues for proper technique and for proper hand placement. Pt has no ability to care for self with overall maxA for ADL due to pain and pt unable to move BLEs well. Pt would benefit from continued OT skilled services for ADL, mobility and safety in CIR setting. OT to follow acutely for AE and transfers.    Follow Up Recommendations  CIR;Supervision/Assistance - 24 hour    Equipment Recommendations  None recommended by OT    Recommendations for Other Services      Precautions / Restrictions Precautions Precautions: Fall;Back Precaution Booklet Issued: Yes (comment) Precaution Comments: handout given with review of precautions Restrictions Weight Bearing Restrictions: No       Mobility Bed Mobility Overal bed mobility: Needs Assistance Bed Mobility: Sit to Supine Rolling: Mod assist Sidelying to sit: Mod assist   Sit to supine: Mod assist;+2 for physical assistance;HOB elevated   General bed mobility comments: increased time and effort; pt to long sitting and requiring HOB elevated to relax in sitting position  Transfers Overall transfer level: Needs assistance Equipment used: None Transfers: Sit to/from Stand Sit to Stand: Mod assist;+2 physical assistance;+2 safety/equipment(unable)        Lateral/Scoot Transfers: Mod assist General transfer comment:  Pt requiring a boost for power up as pt's BUEs are decreased in strength    Balance Overall balance assessment: Needs assistance;History of Falls Sitting-balance support: Feet supported;No upper extremity supported Sitting balance-Leahy Scale: Good     Standing balance support: Bilateral upper extremity supported;During functional activity Standing balance-Leahy Scale: Poor Standing balance comment: reliant on UEs                            ADL either performed or assessed with clinical judgement   ADL Overall ADL's : Needs assistance/impaired Eating/Feeding: Independent;Sitting                   Lower Body Dressing: Minimal assistance;With adaptive equipment;Cueing for safety;Cueing for sequencing;Sitting/lateral leans Lower Body Dressing Details (indicate cue type and reason): LB dressing with AE provided in sitting             Functional mobility during ADLs: Moderate assistance;+2 for physical assistance;+2 for safety/equipment(stedy) General ADL Comments: Pt limited by poor activity tolerance and inability to hold self up for standing tolerance with stedy  > 1 min     Vision   Vision Assessment?: No apparent visual deficits   Perception     Praxis      Cognition Arousal/Alertness: Awake/alert Behavior During Therapy: WFL for tasks assessed/performed Overall Cognitive Status: Within Functional Limits for tasks assessed  Exercises     Shoulder Instructions       General Comments Pt education on AE and back precautions    Pertinent Vitals/ Pain       Pain Assessment: 0-10 Pain Score: 4  Pain Location: back Pain Descriptors / Indicators: Aching;Discomfort;Guarding Pain Intervention(s): Premedicated before session;Monitored during session  Home Living                                          Prior Functioning/Environment              Frequency  Min  3X/week        Progress Toward Goals  OT Goals(current goals can now be found in the care plan section)  Progress towards OT goals: Progressing toward goals  Acute Rehab OT Goals Patient Stated Goal: go home today OT Goal Formulation: With patient Time For Goal Achievement: 01/17/19 Potential to Achieve Goals: Good ADL Goals Pt Will Perform Grooming: with set-up;with supervision;standing Pt Will Perform Lower Body Bathing: with supervision;with adaptive equipment;sit to/from stand Pt Will Perform Lower Body Dressing: with supervision;with adaptive equipment;sit to/from stand Pt Will Transfer to Toilet: with supervision;ambulating;bedside commode Pt Will Perform Toileting - Clothing Manipulation and hygiene: with supervision;sit to/from stand Pt Will Perform Tub/Shower Transfer: Tub transfer;with supervision;ambulating;rolling walker;tub bench Additional ADL Goal #1: Pt will be S in and OOB for basic ADLs Additional ADL Goal #2: Pt will be able to state 3/3 back precautions  Plan Discharge plan remains appropriate;Frequency needs to be updated    Co-evaluation    PT/OT/SLP Co-Evaluation/Treatment: Yes Reason for Co-Treatment: Complexity of the patient's impairments (multi-system involvement);For patient/therapist safety;To address functional/ADL transfers   OT goals addressed during session: ADL's and self-care      AM-PAC OT "6 Clicks" Daily Activity     Outcome Measure   Help from another person eating meals?: None Help from another person taking care of personal grooming?: A Little Help from another person toileting, which includes using toliet, bedpan, or urinal?: A Lot Help from another person bathing (including washing, rinsing, drying)?: A Lot Help from another person to put on and taking off regular upper body clothing?: A Little Help from another person to put on and taking off regular lower body clothing?: A Lot 6 Click Score: 16    End of Session Equipment  Utilized During Treatment: Gait belt;Rolling walker  OT Visit Diagnosis: Unsteadiness on feet (R26.81);Other abnormalities of gait and mobility (R26.89);Repeated falls (R29.6);Muscle weakness (generalized) (M62.81);History of falling (Z91.81);Pain Pain - part of body: (back)   Activity Tolerance Patient tolerated treatment well   Patient Left in chair;with call bell/phone within reach;with chair alarm set   Nurse Communication Mobility status;Other (comment)        Time: 9702-6378 OT Time Calculation (min): 30 min  Charges: OT General Charges $OT Visit: 1 Visit OT Evaluation $OT Eval Moderate Complexity: 1 Mod OT Treatments $Self Care/Home Management : 8-22 mins $Therapeutic Activity: 8-22 mins $Neuromuscular Re-education: 8-22 mins  Darryl Nestle) Marsa Aris OTR/L Acute Rehabilitation Services Pager: [phone] Office: [place] 01/04/2019, 12:39 PM

## 2019-01-04 NOTE — Progress Notes (Signed)
Physical Therapy Treatment Patient Details Name: Paul Pineda MRN: [id number] DOB: [birth year] Today's Date: 01/04/2019    History of Present Illness Patient is a 73 y/o male s/p Lumbar laminectomy microdiscectomy L2-3 L3-4 on 01/02/2019. Patient with a PMH significant of HTN, arthritis, LBBB. Pt with recent near fall upon eval 6/5 resulting in BLE weakness and decreased confidence.     PT Comments    Pt has been unable to stand or ambulate since near fall yesterday when B knees buckled. Required mod A +2 to stand within stedy. Was able to move feet in place with knee support but was not able to ambulate safely due to B knee instability when not supported. Practiced sit to stand from elevated surfaces as well as there ex in sitting. PT will continue to follow.    Follow Up Recommendations  CIR;Supervision/Assistance - 24 hour     Equipment Recommendations  None recommended by PT    Recommendations for Other Services       Precautions / Restrictions Precautions Precautions: Fall;Back Precaution Booklet Issued: No Precaution Comments:  review of precautions Spinal Brace: (no brace needed per order) Restrictions Weight Bearing Restrictions: No    Mobility  Bed Mobility Overal bed mobility: Needs Assistance Bed Mobility: Sit to Supine Rolling: Mod assist Sidelying to sit: Mod assist   Sit to supine: Mod assist;+2 for physical assistance;HOB elevated   General bed mobility comments: increased time and effort, pt heavily reliant on UE's; pt chose to pivot to long sitting and requiring HOB elevated to relax in sitting position. Educated on sitting to SL to avoid the discomfort of long sitting  Transfers Overall transfer level: Needs assistance Equipment used: Ambulation equipment used Transfers: Sit to/from [facility] Sit to Stand: Mod assist;+2 physical assistance;+2 safety/equipment Stand pivot transfers: Total assist      Lateral/Scoot Transfers: Mod  assist General transfer comment: mod A +2 with pad for power up. Practiced sit to stand from recliner once and then stedy 2x. UE's shaky after increased time in standing due to heavy wt through them. Pt locks knees in standing to prevent buckling.   Ambulation/Gait             General Gait Details: practiced stepping feet in place in stedy but unable to ambulate due to B knee instability   Stairs             Wheelchair Mobility    Modified Rankin (Stroke Patients Only)       Balance Overall balance assessment: Needs assistance;History of Falls Sitting-balance support: Feet supported;No upper extremity supported Sitting balance-Leahy Scale: Good     Standing balance support: Bilateral upper extremity supported;During functional activity Standing balance-Leahy Scale: Poor Standing balance comment: reliant on UEs                             Cognition Arousal/Alertness: Awake/alert Behavior During Therapy: WFL for tasks assessed/performed Overall Cognitive Status: Within Functional Limits for tasks assessed                                        Exercises General Exercises - Lower Extremity Ankle Circles/Pumps: AROM;Both;10 reps;Seated Quad Sets: AROM;Both;10 reps;Seated Long Arc Quad: AROM;Both;10 reps;Seated Heel Slides: AROM;Both;10 reps;Seated    General Comments General comments (skin integrity, edema, etc.): Pt education on AE and back precautions  Pertinent Vitals/Pain Pain Assessment: Faces Pain Score: 4  Faces Pain Scale: Hurts even more Pain Location: back and knees Pain Descriptors / Indicators: Aching;Discomfort;Guarding Pain Intervention(s): Limited activity within patient's tolerance;Monitored during session    Home Living                      Prior Function            PT Goals (current goals can now be found in the care plan section) Acute Rehab PT Goals Patient Stated Goal: pt agreeable to  rehab today PT Goal Formulation: With patient Time For Goal Achievement: 01/17/19 Potential to Achieve Goals: Good Progress towards PT goals: Not progressing toward goals - comment(LE weakness)    Frequency    Min 5X/week      PT Plan Current plan remains appropriate    Co-evaluation PT/OT/SLP Co-Evaluation/Treatment: Yes Reason for Co-Treatment: Complexity of the patient's impairments (multi-system involvement);For patient/therapist safety PT goals addressed during session: Mobility/safety with mobility;Balance;Proper use of DME;Strengthening/ROM OT goals addressed during session: ADL's and self-care      AM-PAC PT "6 Clicks" Mobility   Outcome Measure  Help needed turning from your back to your side while in a flat bed without using bedrails?: A Little Help needed moving from lying on your back to sitting on the side of a flat bed without using bedrails?: A Lot Help needed moving to and from a bed to a chair (including a wheelchair)?: A Lot Help needed standing up from a chair using your arms (e.g., wheelchair or bedside chair)?: A Lot Help needed to walk in hospital room?: Total   6 Click Score: 10    End of Session Equipment Utilized During Treatment: Gait belt Activity Tolerance: Patient limited by pain Patient left: with call bell/phone within reach;in bed;with bed alarm set Nurse Communication: Mobility status PT Visit Diagnosis: Unsteadiness on feet (R26.81);Other abnormalities of gait and mobility (R26.89);Muscle weakness (generalized) (M62.81);History of falling (Z91.81);Pain Pain - part of body: Knee(back)     Time: 5701-7793 PT Time Calculation (min) (ACUTE ONLY): 28 min  Charges:  $Gait Training: 8-22 mins                     Leighton Roach, [place]  Pager [phone] Office [place] 01/04/2019, 1:06 PM

## 2019-01-04 NOTE — Plan of Care (Signed)

## 2019-01-04 NOTE — Progress Notes (Signed)
NEUROSURGERY PROGRESS NOTE  Doing well. Complains of appropriate back soreness. No numbness, tingling or weakness Ambulating and voiding well Good strength and sensation Incision CDI  Temp:  [98.2 F (36.8 C)-98.7 F (37.1 C)] 98.7 F (37.1 C) (06/06 0753) Pulse Rate:  [79-97] 86 (06/06 0753) Resp:  [16-20] 20 (06/06 0453) BP: (118-139)/(75-87) 118/77 (06/06 0753) SpO2:  [92 %-100 %] 93 % (06/06 0753)  Plan: Will wait for him to work with therapies today. Yesterday he was a little unsteady on his feet. As long as therapies think he is safe to dc home it is ok from our standpoint.   Eleonore Chiquito, NP 01/04/2019 9:19 AM

## 2019-01-04 NOTE — Evaluation (Signed)
Occupational Therapy Evaluation Patient Details Name: Paul Pineda MRN: [id number] DOB: 08/20/1945 Today's Date: 01/04/2019    History of Present Illness Patient is a 73 y/o male s/p Lumbar laminectomy microdiscectomy L2-3 L3-4 on 01/02/2019. Patient with a PMH significant of HTN, arthritis, LBBB. Pt with recent near fall upon eval 6/5 resulting in BLE weakness and decreased confidence.    Clinical Impression   Pt with increased weakness today and inability to stand x3 attempts with maxA +1. Pt leaning far forward and requiring totalA for chest to lift. Pt performing lateral scoot to recliner with modA overall. Pt tolerating sitting in recliner for LB AE introduction. More education on back precautions provided. Pt minA for UB ADL and maxA for LB ADL- with AE pt is minguardA in sitting. Pt would benefit from continued OT skilled services for ADL, mobility and safety in CIR setting. Pt agreeable as he agrees his family will be unable to care for him at this level. OT following.      Follow Up Recommendations  CIR;Supervision/Assistance - 24 hour    Equipment Recommendations  None recommended by OT    Recommendations for Other Services       Precautions / Restrictions Precautions Precautions: Fall;Back Precaution Booklet Issued: Yes (comment) Precaution Comments: handout given with review of precautions Restrictions Weight Bearing Restrictions: No      Mobility Bed Mobility Overal bed mobility: Needs Assistance Bed Mobility: Rolling;Sidelying to Sit Rolling: Mod assist Sidelying to sit: Mod assist       General bed mobility comments: increased time and effort; heavy use of OTR's hand for trunk elevation; use of bed rails; cueing for sequencing  Transfers Overall transfer level: Needs assistance Equipment used: Rolling walker (2 wheeled) Transfers: Lateral/Scoot Transfers Sit to Stand: Mod assist(unable)        Lateral/Scoot Transfers: Mod assist General transfer  comment: Pt unable to sit to stand with no success with +1. So pt laterally scooting to recliner.    Balance Overall balance assessment: Needs assistance;History of Falls Sitting-balance support: Feet supported;No upper extremity supported Sitting balance-Leahy Scale: Good     Standing balance support: Bilateral upper extremity supported;During functional activity Standing balance-Leahy Scale: Poor Standing balance comment: reliant on UEs                            ADL either performed or assessed with clinical judgement   ADL Overall ADL's : Needs assistance/impaired Eating/Feeding: Independent;Sitting                   Lower Body Dressing: Minimal assistance;With adaptive equipment;Cueing for safety;Cueing for sequencing;Sitting/lateral leans Lower Body Dressing Details (indicate cue type and reason): LB dressing with AE provided in sitting             Functional mobility during ADLs: Total assistance;Rolling walker General ADL Comments: Pt requires increased assist for LB ADL. AE education previewed, pt requires additional info and trials with AE.     Vision   Vision Assessment?: No apparent visual deficits     Perception     Praxis      Pertinent Vitals/Pain Pain Assessment: 0-10 Pain Score: 6  Pain Location: back Pain Descriptors / Indicators: Aching;Discomfort;Guarding Pain Intervention(s): Premedicated before session;Limited activity within patient's tolerance;Monitored during session;Repositioned     Hand Dominance     Extremity/Trunk Assessment Upper Extremity Assessment Upper Extremity Assessment: Generalized weakness   Lower Extremity Assessment Lower Extremity Assessment: Defer to PT  evaluation;RLE deficits/detail;LLE deficits/detail RLE Deficits / Details: b/l knees buckling LLE Deficits / Details: b/l knees buckling       Communication     Cognition Arousal/Alertness: Awake/alert Behavior During Therapy: WFL for tasks  assessed/performed Overall Cognitive Status: Within Functional Limits for tasks assessed                                     General Comments  Pt education fo back precautions and need for CIR. Pt agreeable that family will be unable to assist pt.    Exercises     Shoulder Instructions      Home Living                                          Prior Functioning/Environment                   OT Problem List:        OT Treatment/Interventions:      OT Goals(Current goals can be found in the care plan section) Acute Rehab OT Goals Patient Stated Goal: go home today OT Goal Formulation: With patient Time For Goal Achievement: 01/17/19 Potential to Achieve Goals: Good ADL Goals Pt Will Perform Grooming: with set-up;with supervision;standing Pt Will Perform Lower Body Bathing: with supervision;with adaptive equipment;sit to/from stand Pt Will Perform Lower Body Dressing: with supervision;with adaptive equipment;sit to/from stand Pt Will Transfer to Toilet: with supervision;ambulating;bedside commode Pt Will Perform Toileting - Clothing Manipulation and hygiene: with supervision;sit to/from stand Pt Will Perform Tub/Shower Transfer: Tub transfer;with supervision;ambulating;rolling walker;tub bench Additional ADL Goal #1: Pt will be S in and OOB for basic ADLs Additional ADL Goal #2: Pt will be able to state 3/3 back precautions  OT Frequency: Min 3X/week   Barriers to D/C:            Co-evaluation              AM-PAC OT "6 Clicks" Daily Activity     Outcome Measure Help from another person eating meals?: None Help from another person taking care of personal grooming?: A Little Help from another person toileting, which includes using toliet, bedpan, or urinal?: A Lot Help from another person bathing (including washing, rinsing, drying)?: A Lot Help from another person to put on and taking off regular upper body clothing?: A  Little Help from another person to put on and taking off regular lower body clothing?: A Lot 6 Click Score: 16   End of Session Equipment Utilized During Treatment: Gait belt;Rolling walker Nurse Communication: Mobility status;Other (comment)  Activity Tolerance: Patient tolerated treatment well Patient left: in chair;with call bell/phone within reach;with chair alarm set  OT Visit Diagnosis: Unsteadiness on feet (R26.81);Other abnormalities of gait and mobility (R26.89);Repeated falls (R29.6);Muscle weakness (generalized) (M62.81);History of falling (Z91.81);Pain Pain - part of body: (back)                Time: 0818-0900 OT Time Calculation (min): 42 min Charges:  OT General Charges $OT Visit: 1 Visit OT Evaluation $OT Eval Moderate Complexity: 1 Mod OT Treatments $Self Care/Home Management : 8-22 mins $Therapeutic Activity: 8-22 mins  Ebony Hail Harold Hedge) Marsa Aris OTR/L Acute Rehabilitation Services Pager: [phone] Office: [place] 01/04/2019, 12:30 PM

## 2019-01-05 DIAGNOSIS — L409 Psoriasis, unspecified: Secondary | ICD-10-CM | POA: Diagnosis not present

## 2019-01-05 DIAGNOSIS — H9190 Unspecified hearing loss, unspecified ear: Secondary | ICD-10-CM | POA: Diagnosis present

## 2019-01-05 DIAGNOSIS — I10 Essential (primary) hypertension: Secondary | ICD-10-CM | POA: Diagnosis present

## 2019-01-05 DIAGNOSIS — Z79891 Long term (current) use of opiate analgesic: Secondary | ICD-10-CM | POA: Diagnosis not present

## 2019-01-05 DIAGNOSIS — E785 Hyperlipidemia, unspecified: Secondary | ICD-10-CM | POA: Diagnosis present

## 2019-01-05 DIAGNOSIS — M7989 Other specified soft tissue disorders: Secondary | ICD-10-CM | POA: Diagnosis not present

## 2019-01-05 DIAGNOSIS — Z85828 Personal history of other malignant neoplasm of skin: Secondary | ICD-10-CM | POA: Diagnosis not present

## 2019-01-05 DIAGNOSIS — M5116 Intervertebral disc disorders with radiculopathy, lumbar region: Secondary | ICD-10-CM | POA: Diagnosis present

## 2019-01-05 DIAGNOSIS — M5416 Radiculopathy, lumbar region: Secondary | ICD-10-CM | POA: Diagnosis not present

## 2019-01-05 DIAGNOSIS — H269 Unspecified cataract: Secondary | ICD-10-CM | POA: Diagnosis present

## 2019-01-05 DIAGNOSIS — D62 Acute posthemorrhagic anemia: Secondary | ICD-10-CM | POA: Diagnosis not present

## 2019-01-05 DIAGNOSIS — Z79899 Other long term (current) drug therapy: Secondary | ICD-10-CM | POA: Diagnosis not present

## 2019-01-05 DIAGNOSIS — I82402 Acute embolism and thrombosis of unspecified deep veins of left lower extremity: Secondary | ICD-10-CM | POA: Diagnosis not present

## 2019-01-05 DIAGNOSIS — Z9181 History of falling: Secondary | ICD-10-CM | POA: Diagnosis not present

## 2019-01-05 DIAGNOSIS — Z87891 Personal history of nicotine dependence: Secondary | ICD-10-CM | POA: Diagnosis not present

## 2019-01-05 DIAGNOSIS — Z809 Family history of malignant neoplasm, unspecified: Secondary | ICD-10-CM | POA: Diagnosis not present

## 2019-01-05 DIAGNOSIS — M48061 Spinal stenosis, lumbar region without neurogenic claudication: Secondary | ICD-10-CM | POA: Diagnosis present

## 2019-01-05 DIAGNOSIS — Z1159 Encounter for screening for other viral diseases: Secondary | ICD-10-CM | POA: Diagnosis not present

## 2019-01-05 DIAGNOSIS — M10071 Idiopathic gout, right ankle and foot: Secondary | ICD-10-CM | POA: Diagnosis not present

## 2019-01-05 DIAGNOSIS — I824Z2 Acute embolism and thrombosis of unspecified deep veins of left distal lower extremity: Secondary | ICD-10-CM | POA: Diagnosis not present

## 2019-01-05 DIAGNOSIS — K5900 Constipation, unspecified: Secondary | ICD-10-CM | POA: Diagnosis present

## 2019-01-05 NOTE — Progress Notes (Signed)
Physical Therapy Treatment Patient Details Name: Paul Pineda MRN: [id number] DOB: 1946-06-16 Today's Date: 01/05/2019    History of Present Illness Patient is a 73 y/o male s/p Lumbar laminectomy microdiscectomy L2-3 L3-4 on 01/02/2019. Patient with a PMH significant of HTN, arthritis, LBBB. Pt with recent near fall upon eval 6/5 resulting in BLE weakness and decreased confidence.     PT Comments    Pt lethargic today and fatigued very quickly with activity. Min A for squat pivot bed to [facility]. Pt then required max A +2 for power up to stedy. Pt had difficulty maintaining upright posture in standing and needed cues for trunk and cervical spine extension. Pt c/o neck pain today and seems to be unable to hold head up due to fatigue. Practiced standing from elevated surface of stedy, needed min A for this and had difficulty pushing through BUE's. Poor tolerance for stepping while standing. Continue to recommend CIR for further rehab. PT will continue to follow.    Follow Up Recommendations  CIR;Supervision/Assistance - 24 hour     Equipment Recommendations  None recommended by PT    Recommendations for Other Services       Precautions / Restrictions Precautions Precautions: Fall;Back Precaution Booklet Issued: No Precaution Comments:  review of precautions Spinal Brace: (no brace needed per order) Restrictions Weight Bearing Restrictions: No    Mobility  Bed Mobility Overal bed mobility: Needs Assistance Bed Mobility: Supine to Sit Rolling: Min assist Sidelying to sit: Min assist       General bed mobility comments: vc's for precautions. Min A for elevation of trunk to sitting. Pt reports that his UE's feel weaker today  Transfers Overall transfer level: Needs assistance Equipment used: Ambulation equipment used;None Transfers: Sit to/from [employer] Sit to Stand: +2 physical assistance;+2 safety/equipment;Max assist;Min assist   Squat pivot transfers: Min  assist     General transfer comment: squat pivot transfer bed to [facility] with min A. Pt required max A +2 to stand from [facility] to stedy. Was able to stand 2x from elevated surface of stedy with min A, +2 for safety. Pt with decreased ability to push through arms today to achieve upright posture.   Ambulation/Gait Ambulation/Gait assistance: Max assist           General Gait Details: pt able to step 3x in stedy but fatigued very quickly and was very unsteady as well as having difficulty holding head up and c/o neck pain   Stairs             Wheelchair Mobility    Modified Rankin (Stroke Patients Only)       Balance Overall balance assessment: Needs assistance;History of Falls Sitting-balance support: Feet supported;No upper extremity supported Sitting balance-Leahy Scale: Poor Sitting balance - Comments: reliant on UE support to maintain balance in sitting today   Standing balance support: Bilateral upper extremity supported;During functional activity Standing balance-Leahy Scale: Poor Standing balance comment: very weak in standing                            Cognition Arousal/Alertness: Lethargic;Suspect due to medications Behavior During Therapy: [facility] for tasks assessed/performed Overall Cognitive Status: Within Functional Limits for tasks assessed                                 General Comments: pt sluggish today but had received oral pain  meds      Exercises General Exercises - Lower Extremity Ankle Circles/Pumps: AROM;Both;10 reps;Seated Long Arc Quad: AROM;Both;10 reps;Seated    General Comments General comments (skin integrity, edema, etc.): pt with excess slouging of skin due to psoriasis      Pertinent Vitals/Pain Pain Assessment: Faces Faces Pain Scale: Hurts even more Pain Location: back and neck Pain Descriptors / Indicators: Aching;Discomfort;Guarding Pain Intervention(s): Limited activity within patient's  tolerance;Monitored during session;Premedicated before session    Home Living                      Prior Function            PT Goals (current goals can now be found in the care plan section) Acute Rehab PT Goals Patient Stated Goal: pt agreeable to rehab today PT Goal Formulation: With patient Time For Goal Achievement: 01/17/19 Potential to Achieve Goals: Good Progress towards PT goals: Not progressing toward goals - comment(fatigue and lethargy)    Frequency    Min 5X/week      PT Plan Current plan remains appropriate    Co-evaluation              AM-PAC PT "6 Clicks" Mobility   Outcome Measure  Help needed turning from your back to your side while in a flat bed without using bedrails?: A Little Help needed moving from lying on your back to sitting on the side of a flat bed without using bedrails?: A Lot Help needed moving to and from a bed to a chair (including a wheelchair)?: A Lot Help needed standing up from a chair using your arms (e.g., wheelchair or bedside chair)?: A Lot Help needed to walk in hospital room?: Total Help needed climbing 3-5 steps with a railing? : Total 6 Click Score: 11    End of Session Equipment Utilized During Treatment: Gait belt Activity Tolerance: Patient limited by fatigue;Patient limited by lethargy Patient left: with call bell/phone within reach;in chair;with chair alarm set Nurse Communication: Mobility status PT Visit Diagnosis: Unsteadiness on feet (R26.81);Other abnormalities of gait and mobility (R26.89);Muscle weakness (generalized) (M62.81);History of falling (Z91.81);Pain Pain - part of body: Knee(back, neck)     Time: 4097-3532 PT Time Calculation (min) (ACUTE ONLY): 22 min  Charges:  $Therapeutic Activity: 8-22 mins                     Leighton Roach, PT  Acute Rehab Services  Pager [phone] Office [place] 01/05/2019, 1:06 PM

## 2019-01-05 NOTE — Progress Notes (Signed)
Patient ID: Paul Pineda, male   DOB: 1945-12-30, 73 y.o.   MRN: [id number] Subjective: Patient reports continued weakness in the legs limiting his mobility.  He has appropriate back soreness but no leg pain.  Denies numbness or tingling in the legs.  Objective: Vital signs in last 24 hours: Temp:  [98 F (36.7 C)-99 F (37.2 C)] 99 F (37.2 C) (06/06 2047) Pulse Rate:  [86-115] 98 (06/06 2047) Resp:  [18-20] 20 (06/06 2047) BP: (105-128)/(61-77) 105/61 (06/06 2047) SpO2:  [93 %-98 %] 97 % (06/06 2047)  Intake/Output from previous day: 06/06 0701 - 06/07 0700 In: 340 [P.O.:240; IV Piggyback:100] Out: 120 [Urine:100; Drains:20] Intake/Output this shift: No intake/output data recorded.  Neurologic: Grossly normal except for lower extremity strength which is 4- to 4 out of 5 to in bed exam more weaker proximally than distal  Lab Results: Lab Results  Component Value Date   WBC 11.8 (H) 12/29/2018   HGB 13.6 12/29/2018   HCT 41.4 12/29/2018   MCV 105.6 (H) 12/29/2018   PLT 189 12/29/2018   Lab Results  Component Value Date   INR 0.96 07/04/2013   BMET Lab Results  Component Value Date   NA 141 12/29/2018   K 3.7 12/29/2018   CL 108 12/29/2018   CO2 24 12/29/2018   GLUCOSE 110 (H) 12/29/2018   BUN 12 12/29/2018   CREATININE 1.11 12/29/2018   CALCIUM 8.6 (L) 12/29/2018    Studies/Results: No results found.  Assessment/Plan: Stable leg weakness.  Seems similar to preop.  Think he is going to need CIR.  Estimated body mass index is 31.14 kg/m as calculated from the following:   Height as of this encounter: 5\' 10"  (1.778 m).   Weight as of this encounter: 98.4 kg.    LOS: 0 days    Eustace Moore 01/05/2019, 7:48 AM

## 2019-01-06 DIAGNOSIS — M5126 Other intervertebral disc displacement, lumbar region: Secondary | ICD-10-CM | POA: Diagnosis present

## 2019-01-06 NOTE — Progress Notes (Signed)
Physical Therapy Treatment Patient Details Name: Paul Pineda MRN: [id number] DOB: 1945-08-25 Today's Date: 01/06/2019    History of Present Illness Patient is a 73 y/o male s/p Lumbar laminectomy microdiscectomy L2-3 L3-4 on 01/02/2019. Patient with a PMH significant of HTN, arthritis, LBBB. Pt with recent near fall upon eval 6/5 resulting in BLE weakness and decreased confidence.     PT Comments    Patient progressing slowly towards PT goals. Continues to require Mod-Max A for to stand from stedy and all surfaces. BLE strength seems slightly improved today as knees are not buckling with standing bouts. Tolerated gait training with mod A for balance/safety due to instability and unpredictability. Pt with poor awareness of safety/deficits. Agreeable to CIR. Will continue to follow.    Follow Up Recommendations  CIR;Supervision/Assistance - 24 hour     Equipment Recommendations  None recommended by PT    Recommendations for Other Services       Precautions / Restrictions Precautions Precautions: Fall;Back Precaution Booklet Issued: No Precaution Comments: Reviewed precautions Spinal Brace: (no brace needed per order) Restrictions Weight Bearing Restrictions: No    Mobility  Bed Mobility Overal bed mobility: Needs Assistance Bed Mobility: Supine to Sit     Supine to sit: Supervision     General bed mobility comments: vc's for precautions. supervision for elevation of trunk to sitting. use of bed rails  Transfers Overall transfer level: Needs assistance Equipment used: Ambulation equipment used;Rolling walker (2 wheeled) Transfers: Sit to/from Stand Sit to Stand: +2 safety/equipment;Mod assist;Max assist;Min assist;+2 physical assistance         General transfer comment: performed sit<>stand with Stedy x3, requiring increased assist with each attempt Mod A to Max A. Then was able to stand with mod A +2 from elevated recliner with RW and cues for hand placement.    Ambulation/Gait Ambulation/Gait assistance: Min assist Gait Distance (Feet): 20 Feet Assistive device: Rolling walker (2 wheeled) Gait Pattern/deviations: Step-through pattern;Decreased stride length;Trunk flexed Gait velocity: decreased   General Gait Details: Slow, unsteady gait with bil knee instability noted but no buckling. Cues for upright posture. Fatigues.    Stairs             Wheelchair Mobility    Modified Rankin (Stroke Patients Only)       Balance Overall balance assessment: Needs assistance;History of Falls Sitting-balance support: Feet supported;No upper extremity supported Sitting balance-Leahy Scale: Fair     Standing balance support: Bilateral upper extremity supported;During functional activity Standing balance-Leahy Scale: Poor Standing balance comment: dependent on BUE in standing and RW.                             Cognition Arousal/Alertness: Awake/alert Behavior During Therapy: WFL for tasks assessed/performed Overall Cognitive Status: Impaired/Different from baseline Area of Impairment: Safety/judgement                         Safety/Judgement: Decreased awareness of safety;Decreased awareness of deficits     General Comments: Pt is keen to go home but admits frequent falls, and says "I'll just lay there until therapy comes"      Exercises      General Comments General comments (skin integrity, edema, etc.): Pt with excess sloughing of skin due to psoriasis.      Pertinent Vitals/Pain Pain Assessment: 0-10 Pain Score: 5  Faces Pain Scale: Hurts even more Pain Location: lower back at incision site  Pain Descriptors / Indicators: Aching;Discomfort;Guarding Pain Intervention(s): Monitored during session;Repositioned    Home Living                      Prior Function            PT Goals (current goals can now be found in the care plan section) Acute Rehab PT Goals Patient Stated Goal: pt  agreeable to rehab today Progress towards PT goals: Progressing toward goals    Frequency    Min 5X/week      PT Plan Current plan remains appropriate    Co-evaluation PT/OT/SLP Co-Evaluation/Treatment: Yes Reason for Co-Treatment: For patient/therapist safety;To address functional/ADL transfers PT goals addressed during session: Mobility/safety with mobility;Balance;Proper use of DME OT goals addressed during session: ADL's and self-care;Proper use of Adaptive equipment and DME      AM-PAC PT "6 Clicks" Mobility   Outcome Measure  Help needed turning from your back to your side while in a flat bed without using bedrails?: A Little Help needed moving from lying on your back to sitting on the side of a flat bed without using bedrails?: A Little Help needed moving to and from a bed to a chair (including a wheelchair)?: A Lot Help needed standing up from a chair using your arms (e.g., wheelchair or bedside chair)?: A Lot Help needed to walk in hospital room?: A Lot Help needed climbing 3-5 steps with a railing? : Total 6 Click Score: 13    End of Session Equipment Utilized During Treatment: Gait belt Activity Tolerance: Patient tolerated treatment well Patient left: with call bell/phone within reach;in chair;with chair alarm set Nurse Communication: Mobility status PT Visit Diagnosis: Unsteadiness on feet (R26.81);Other abnormalities of gait and mobility (R26.89);Muscle weakness (generalized) (M62.81);History of falling (Z91.81);Pain Pain - part of body: (back)     Time: 3154-0086 PT Time Calculation (min) (ACUTE ONLY): 40 min  Charges:  $Therapeutic Activity: 8-22 mins                     Wray Kearns, PT, DPT Acute Rehabilitation Services Pager [phone] [place] 01/06/2019, 12:40 PM

## 2019-01-06 NOTE — PMR Pre-admission (Signed)
PMR Admission Coordinator Pre-Admission Assessment  Patient: Paul Pineda is an 73 y.o., male MRN: [id number] DOB: 1945/12/16 Height: 5' 10"  (177.8 cm) Weight: 98.4 kg  Insurance Information HMO: yes    PPO:      PCP:      IPA:      80/20:      OTHER: medicare advantage plan PRIMARY: Blue Medicare      Policy#: [id number]      Subscriber: pt CM Name: Cedric      Phone#: [phone]     Fax#: [phone] Pre-Cert#: TBD f/u in 7 days     Employer:  Benefits:  Phone #: [phone]     Name: 6/8 Eff. Date: 07/31/2018     Deduct: none      Out of Pocket Max: $3900      Life Max: none CIR: $310 co pay per day days 1 until 6      SNF: no co pay days 1 until 20; $178 co pay per day days 21 until 60; no co pay days 61 until 100 Outpatient: $40 co pay per visit     Co-Pay: visits per medical neccesity Home Health: 1005      Co-Pay: visits per medical neccesity DME: 80%     Co-Pay: 205 Providers: in network  SECONDARY: none       Medicaid Application Date:       Case Manager:  Disability Application Date:       Case Worker:   The "Data Collection Information Summary" for patients in Inpatient Rehabilitation Facilities with attached "Privacy Act [place] Records" was provided and verbally reviewed with: Patient  Emergency Contact Information Contact Information    Name Relation Home Work Mobile   Christina,Carol Spouse [phone]  [phone]   Amr, [place] Daughter   [phone]   Breit,katie Daughter   [phone]      Current Medical History  Patient Admitting Diagnosis: lumbar discectomy and radiculopathy  History of Present Illness:[name] is a 73 year old right-handed male history of hypertension.  History of a fall 3 weeks pta with worsening back pain and mobility. Multiple falls during that time due to deficits. Presented 01/02/2019 with low back pain radiating to left lower extremity x2 weeks.  Denied bowel or bladder disturbances.  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  Underwent sublaminar R decompressive laminectomy L2-3 with left side microdiscectomy of L3 nerve root with partial medial facetectomy and foraminotomy 01/02/2019 per Dr. Saintclair Halsted.  No brace needed.  Hospital course pain management.    On day of admit, patient reluctant to admit for 7 days, but after therapy session and further discussions with wife and [facility], agreed to admit.  Patient's medical record from [facility]  has been reviewed by the rehabilitation admission coordinator and physician.  Past Medical History  Past Medical History:  Diagnosis Date  . Arthritis   . Cancer (HCC)    chest lump ,scrotum  . Cataract    right  . Heart murmur   . HOH (hard of hearing)   . Hyperlipidemia   . Hypertension   . Left bundle branch block   . Pneumonia    hx  . Psoriasis     Family History   family history includes Cancer in his father; Psoriasis in his brother and sister.  Prior Rehab/Hospitalizations Has the patient had prior rehab or hospitalizations prior to admission? Yes  Has the patient had major surgery during 100 days  prior to admission? Yes   Current Medications  Current Facility-Administered Medications:  .  0.9 %  sodium chloride infusion, 250 mL, Intravenous, Continuous, Kary Kos, MD .  acetaminophen (TYLENOL) tablet 650 mg, 650 mg, Oral, Q4H PRN **OR** acetaminophen (TYLENOL) suppository 650 mg, 650 mg, Rectal, Q4H PRN, Kary Kos, MD .  alum & mag hydroxide-simeth (MAALOX/MYLANTA) 200-200-20 MG/5ML suspension 30 mL, 30 mL, Oral, Q6H PRN, Kary Kos, MD .  cyclobenzaprine (FLEXERIL) tablet 10 mg, 10 mg, Oral, TID PRN, Kary Kos, MD, 10 mg at 01/03/19 0255 .  desonide (DESOWEN) 0.05 % ointment, , Topical, BID, Meyran, Ocie Cornfield, NP .  docusate sodium (COLACE) capsule 100 mg, 100 mg, Oral, BID, Meyran, Ocie Cornfield, NP, 100 mg at 01/07/19 0903 .  HYDROmorphone (DILAUDID) injection 1 mg,  1 mg, Intravenous, Q2H PRN, Kary Kos, MD .  hydrOXYzine (ATARAX/VISTARIL) tablet 25 mg, 25 mg, Oral, Q8H PRN, Kary Kos, MD, 25 mg at 01/05/19 1843 .  lisinopril (ZESTRIL) tablet 10 mg, 10 mg, Oral, Daily, Kary Kos, MD, 10 mg at 01/07/19 0905 .  LORazepam (ATIVAN) tablet 0.5 mg, 0.5 mg, Oral, BID, Kary Kos, MD, 0.5 mg at 01/07/19 0904 .  menthol-cetylpyridinium (CEPACOL) lozenge 3 mg, 1 lozenge, Oral, PRN **OR** phenol (CHLORASEPTIC) mouth spray 1 spray, 1 spray, Mouth/Throat, PRN, Kary Kos, MD .  metoprolol tartrate (LOPRESSOR) tablet 25 mg, 25 mg, Oral, BID, Kary Kos, MD, 25 mg at 01/07/19 0903 .  multivitamin with minerals tablet 1 tablet, 1 tablet, Oral, Daily, Kary Kos, MD, 1 tablet at 01/07/19 0904 .  ondansetron (ZOFRAN) tablet 4 mg, 4 mg, Oral, Q6H PRN **OR** ondansetron (ZOFRAN) injection 4 mg, 4 mg, Intravenous, Q6H PRN, Kary Kos, MD .  oxyCODONE (Oxy IR/ROXICODONE) immediate release tablet 10 mg, 10 mg, Oral, Q3H PRN, Kary Kos, MD, 10 mg at 01/07/19 0904 .  pantoprazole (PROTONIX) EC tablet 40 mg, 40 mg, Oral, QHS, Kary Kos, MD, 40 mg at 01/06/19 2149 .  senna (SENOKOT) tablet 8.6 mg, 1 tablet, Oral, Daily, Meyran, Ocie Cornfield, NP, 8.6 mg at 01/07/19 0905 .  sodium chloride flush (NS) 0.9 % injection 3 mL, 3 mL, Intravenous, Q12H, Kary Kos, MD, 3 mL at 01/07/19 0905 .  sodium chloride flush (NS) 0.9 % injection 3 mL, 3 mL, Intravenous, PRN, Kary Kos, MD .  tiZANidine (ZANAFLEX) tablet 4 mg, 4 mg, Oral, TID PRN, Kary Kos, MD, 4 mg at 01/05/19 0939 .  traMADol (ULTRAM) tablet 50-100 mg, 50-100 mg, Oral, Q6H PRN, Kary Kos, MD, 100 mg at 01/06/19 0814 .  triamcinolone cream (KENALOG) 0.1 % 1 application, 1 application, Topical, BID, Kary Kos, MD, 1 application at 73/42/87 6811  Patients Current Diet:  Diet Order            Diet - low sodium heart healthy        Diet regular Room service appropriate? Yes; Fluid consistency: Thin  Diet effective now               Precautions / Restrictions Precautions Precautions: Fall, Back Precaution Booklet Issued: No Precaution Comments: Reviewed precautions Spinal Brace: (no brace needed per order) Restrictions Weight Bearing Restrictions: No   Has the patient had 2 or more falls or a fall with injury in the past year? Yes. Initial fall 3 weeks pta. Now falling daily  Prior Activity Level Community (5-7x/wk): Independent without AD prior to initial fall 3 weeks ago(works part time driving trucks)  Prior Functional Level Self Care: Did the  patient need help bathing, dressing, using the toilet or eating? Independent  Indoor Mobility: Did the patient need assistance with walking from room to room (with or without device)? Independent  Stairs: Did the patient need assistance with internal or external stairs (with or without device)? Independent  Functional Cognition: Did the patient need help planning regular tasks such as shopping or remembering to take medications? [place] / Equipment Home Assistive Devices/Equipment: Environmental consultant (specify type), Cane (specify quad or straight), Wheelchair, Hand-held shower hose, Shower chair with back Home Equipment: Grab bars - toilet, Tub bench, Hand held shower head, Walker - 2 wheels, Transport chair(regular W/C on order)  Prior Device Use: Indicate devices/aids used by the patient prior to current illness, exacerbation or injury? None of the above  Current Functional Level Cognition  Overall Cognitive Status: Impaired/Different from baseline Orientation Level: Oriented X4 Following Commands: Follows multi-step commands inconsistently, Follows one step commands with increased time Safety/Judgement: Decreased awareness of safety, Decreased awareness of deficits General Comments: Reports feeling very confused yesterday and not able to recall walking for the first time or the 3 way call with wife and CIR coordinator. "I am in some med  center" Pt not sure why he is in the hospital, "I had to come back and get something." No awareness of safety/deficits. Requires 2 people to stand and pt reports his neighbors can come over and help him walk.    Extremity Assessment (includes Sensation/Coordination)  Upper Extremity Assessment: Generalized weakness  Lower Extremity Assessment: Defer to PT evaluation RLE Deficits / Details: b/l knees buckling LLE Deficits / Details: b/l knees buckling    ADLs  Overall ADL's : Needs assistance/impaired Eating/Feeding: Independent, Sitting Grooming: Set up, Sitting Grooming Details (indicate cue type and reason): in recliner Upper Body Bathing: Moderate assistance, Sitting Upper Body Bathing Details (indicate cue type and reason): for back Lower Body Bathing: Moderate assistance Lower Body Bathing Details (indicate cue type and reason): with Mod A sit<>stand Upper Body Dressing : Set up, Supervision/safety, Sitting Lower Body Dressing: Minimal assistance, With adaptive equipment, Cueing for safety, Cueing for sequencing, Sitting/lateral leans Lower Body Dressing Details (indicate cue type and reason): LB dressing with AE provided in sitting Toilet Transfer: Moderate assistance, +2 for safety/equipment Toilet Transfer Details (indicate cue type and reason): use of Stedy Toileting- Clothing Manipulation and Hygiene: Moderate assistance, Sit to/from stand Toileting - Clothing Manipulation Details (indicate cue type and reason): Pt dependent on BUE for balance, OT performing rear peri care Functional mobility during ADLs: Moderate assistance, +2 for physical assistance, +2 for safety/equipment, Rolling walker General ADL Comments: Pt improving in activity tolerance and ability to sit<>stand, needs further education for compensatory strategies for maintaining back precautions with ADL    Mobility  Overal bed mobility: Needs Assistance Bed Mobility: Supine to Sit Rolling: Min assist Sidelying  to sit: Min assist Supine to sit: Supervision Sit to supine: Mod assist, +2 for physical assistance, HOB elevated General bed mobility comments: vc's for precautions and log roll technique. supervision for elevation of trunk to sitting. use of bed rails. not following commands to do log roll technique.    Transfers  Overall transfer level: Needs assistance Equipment used: Rolling walker (2 wheeled) Transfer via Lift Equipment: Stedy Transfers: Sit to/from Stand Sit to Stand: Max assist, +2 physical assistance Stand pivot transfers: Total assist Squat pivot transfers: Min assist  Lateral/Scoot Transfers: Mod assist General transfer comment: Heavy Max A of 2 to stand from EOB with cues for  technique/hand placement. Pt leaning posteriorly with increased hip/knee flexion. Transferred to chair post ambulation.    Ambulation / Gait / Stairs / Wheelchair Mobility  Ambulation/Gait Ambulation/Gait assistance: Herbalist (Feet): 40 Feet Assistive device: Rolling walker (2 wheeled) Gait Pattern/deviations: Step-through pattern, Decreased stride length, Trunk flexed General Gait Details: Slow, unsteady gait with bil knee instability noted but no buckling. Cues for upright posture. Fatigues.  Gait velocity: decreased    Posture / Balance Dynamic Sitting Balance Sitting balance - Comments: reliant on UE support to maintain balance in sitting today Balance Overall balance assessment: Needs assistance, History of Falls Sitting-balance support: Feet supported, No upper extremity supported Sitting balance-Leahy Scale: Fair Sitting balance - Comments: reliant on UE support to maintain balance in sitting today Standing balance support: Bilateral upper extremity supported, During functional activity Standing balance-Leahy Scale: Poor Standing balance comment: dependent on BUE in standing and RW.     Special needs/care consideration BiPAP/CPAP  N/a CPM  N/a Continuous Drip IV   N/a Dialysis n/a Life Vest  N/a Oxygen  N/a Special Bed  N/a Trach Size  N/a Wound Vac n/a  Skin  Red dry flaky skin; surgical incision. Ecchymosis and skin tears to bilateral UE; very dry peeling skin to arms and especially hands Bowel mgmt:  LBM  No LBM documented Bladder mgmt:  continent Diabetic mgmt:  N/a Behavioral consideration  N/a Chemo/radiation  N/a HOH   Previous Home Environment  Living Arrangements: Spouse/significant other, Children  Lives With: Spouse Available Help at Discharge: Friend(s), Available 24 hours/day Type of Home: House Home Layout: One level Home Access: Stairs to enter Entrance Stairs-Rails: None Technical brewer of Steps: 3 Bathroom Shower/Tub: Gaffer, Chiropodist: Standard Bathroom Accessibility: Yes How Accessible: Accessible via walker Home Care Services: Yes Type of Home Care Services: Home RN, Home PT Additional Comments: states he has a 3-wheeled walker, denies having grab bars in shower stall--previous PT notes state that therre are grab bars in bathroom/shower   Wife works but has been off for three weeks  Discharge Living Setting Plans for Discharge Living Setting: Patient's home, Lives with (comment)(wife) Type of Home at Discharge: House Discharge Home Layout: One level Discharge Home Access: Stairs to enter Entrance Stairs-Rails: None Entrance Stairs-Number of Steps: 3 Discharge Bathroom Shower/Tub: Walk-in shower Discharge Bathroom Toilet: Standard Does the patient have any problems obtaining your medications?: No  Social/Family/Support Systems Patient Roles: Spouse, 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 Information: wife, Arbie Cookey Anticipated Caregiver: wife Anticipated Ambulance person Information: see above Caregiver Availability: 24/7 Discharge Plan Discussed with Primary Caregiver: Yes Is Caregiver In Agreement with Plan?: Yes Does Caregiver/Family have Issues with  Lodging/Transportation while Pt is in Rehab?: No  Goals/Additional Needs Patient/Family Goal for Rehab: supervision to min with PT and OT Expected length of stay: ELOS 10 to 12 days; Patietn states he will stay 7 days and reassess Pt/Family Agrees to Admission and willing to participate: Yes Program Orientation Provided & Reviewed with Pt/Caregiver Including Roles  & Responsibilities: Yes  Decrease burden of Care through IP rehab admission: n/a  Possible need for SNF placement upon discharge:  Not anticipated  Patient Condition: I have reviewed medical records from [facility] , spoken with CM, and patient. I met with patient at the bedside for inpatient rehabilitation assessment.  Patient will benefit from ongoing PT and OT, can actively participate in 3 hours of therapy a day 5 days of the week, and can make measurable gains during the admission.  Patient will also benefit from the coordinated team approach during an Inpatient Acute Rehabilitation admission.  The patient will receive intensive therapy as well as Rehabilitation physician, nursing, social worker, and care management interventions.  Due to bladder management, bowel management, safety, skin/wound care, disease management, medication administration, pain management and patient education the patient requires 24 hour a day rehabilitation nursing.  The patient is currently max assist with mobility and basic ADLs.  Discharge setting and therapy post discharge at home with home health is anticipated.  Patient has agreed to participate in the Acute Inpatient Rehabilitation Program and will admit today.  Preadmission Screen Completed By:  Cleatrice Burke RN MSN 01/07/2019 12:04 PM ______________________________________________________________________   Discussed status with Dr. Naaman Plummer  on 01/07/2019 at  1205 and received approval for admission today.  Admission Coordinator:  Cleatrice Burke, RN MSN, time 2641 Date   01/07/2019   Assessment/Plan: Diagnosis: lumbar stenosis d/t HNP with radiculopathy 1. Does the need for close, 24 hr/day Medical supervision in concert with the patient's rehab needs make it unreasonable for this patient to be served in a less intensive setting? Yes 2. Co-Morbidities requiring supervision/potential complications: HTN, pain  mgt 3. Due to bladder management, bowel management, safety, skin/wound care, disease management, medication administration, pain management and patient education, does the patient require 24 hr/day rehab nursing? Yes 4. Does the patient require coordinated care of a physician, rehab nurse, PT (1-2 hrs/day, 5 days/week) and OT (1-2 hrs/day, 5 days/week) to address physical and functional deficits in the context of the above medical diagnosis(es)? Yes Addressing deficits in the following areas: balance, endurance, locomotion, strength, transferring, bowel/bladder control, bathing, dressing, feeding, grooming, toileting and psychosocial support 5. Can the patient actively participate in an intensive therapy program of at least 3 hrs of therapy 5 days a week? Yes 6. The potential for patient to make measurable gains while on inpatient rehab is excellent 7. Anticipated functional outcomes upon discharge from inpatients are: supervision and min assist PT, supervision and min assist OT, n/a SLP 8. Estimated rehab length of stay to reach the above functional goals is: 10-12 days 9. Anticipated D/C setting: Home 10. Anticipated post D/C treatments: [place] therapy 11. Overall Rehab/Functional Prognosis: excellent  MD Signature: Meredith Staggers, MD, [place] [facility] 01/07/2019

## 2019-01-06 NOTE — Progress Notes (Signed)
Occupational Therapy Treatment Patient Details Name: Paul Pineda MRN: [id number] DOB: [birth year] Today's Date: 01/06/2019    History of present illness Patient is a 73 y/o male s/p Lumbar laminectomy microdiscectomy L2-3 L3-4 on 01/02/2019. Patient with a PMH significant of HTN, arthritis, LBBB. Pt with recent near fall upon eval 6/5 resulting in BLE weakness and decreased confidence.    OT comments  Pt progressing well towards OT goals this session. Performed sit<>stand x 3 from [facility] with increasing need for assist. Charlaine Dalton was utilized for toilet transfer. Pt was mod A for rear peri care and continued to require BUE support in standing so therapist performed peri care. Pt also able to ambulate with RW this session. Pt remains max A for LB ADL and requires cues for safety and had zero recall of back precautions. CIR remains essential for safety and to maximize safety and independence in ADL and functional transfers.    Follow Up Recommendations  CIR;Supervision/Assistance - 24 hour    Equipment Recommendations  Other (comment)(defer to next venue)    Recommendations for Other Services      Precautions / Restrictions Precautions Precautions: Fall;Back Precaution Booklet Issued: No Precaution Comments:  review of precautions Spinal Brace: (no brace needed per order) Restrictions Weight Bearing Restrictions: No       Mobility Bed Mobility Overal bed mobility: Needs Assistance Bed Mobility: Supine to Sit     Supine to sit: Supervision     General bed mobility comments: vc's for precautions. supervision for elevation of trunk to sitting. use of bed rails  Transfers Overall transfer level: Needs assistance Equipment used: Ambulation equipment used;Rolling walker (2 wheeled) Transfers: Sit to/from Stand Sit to Stand: +2 safety/equipment;Mod assist;Max assist;Min assist         General transfer comment: performed sit<>stand with Stedy x3, requiring increased assist with each  attempt. Then was able to stand with mod A +2 from elevated recliner with RW.    Balance Overall balance assessment: Needs assistance;History of Falls Sitting-balance support: Feet supported;No upper extremity supported Sitting balance-Leahy Scale: Fair     Standing balance support: Bilateral upper extremity supported;During functional activity Standing balance-Leahy Scale: Poor Standing balance comment: dependent on BUE in standing                           ADL either performed or assessed with clinical judgement   ADL Overall ADL's : Needs assistance/impaired     Grooming: Set up;Sitting Grooming Details (indicate cue type and reason): in recliner Upper Body Bathing: Moderate assistance;Sitting Upper Body Bathing Details (indicate cue type and reason): for back             Toilet Transfer: Moderate assistance;+2 for safety/equipment Toilet Transfer Details (indicate cue type and reason): use of Stedy Toileting- Water quality scientist and Hygiene: Moderate assistance;Sit to/from stand Toileting - Clothing Manipulation Details (indicate cue type and reason): Pt dependent on BUE for balance, OT performing rear peri care     Functional mobility during ADLs: Moderate assistance;+2 for physical assistance;+2 for safety/equipment;Rolling walker General ADL Comments: Pt improving in activity tolerance and ability to sit<>stand, needs further education for compensatory strategies for maintaining back precautions with ADL     Vision       Perception     Praxis      Cognition Arousal/Alertness: Awake/alert Behavior During Therapy: WFL for tasks assessed/performed Overall Cognitive Status: Impaired/Different from baseline Area of Impairment: Safety/judgement  Safety/Judgement: Decreased awareness of safety;Decreased awareness of deficits     General Comments: Pt is keen to go home but admits frequent falls, and says "I'll just  lay there until therapy comes"        Exercises     Shoulder Instructions       General Comments pt with excess slouging of skin due to psoriasis    Pertinent Vitals/ Pain       Pain Assessment: 0-10 Pain Score: 5  Faces Pain Scale: Hurts even more Pain Location: lower back at incision site Pain Descriptors / Indicators: Aching;Discomfort;Guarding Pain Intervention(s): Monitored during session;Repositioned  Home Living                                          Prior Functioning/Environment              Frequency  Min 3X/week        Progress Toward Goals  OT Goals(current goals can now be found in the care plan section)  Progress towards OT goals: Progressing toward goals  Acute Rehab OT Goals Patient Stated Goal: pt agreeable to rehab today OT Goal Formulation: With patient Time For Goal Achievement: 01/17/19 Potential to Achieve Goals: Good  Plan Discharge plan remains appropriate;Frequency needs to be updated    Co-evaluation    PT/OT/SLP Co-Evaluation/Treatment: Yes Reason for Co-Treatment: For patient/therapist safety;To address functional/ADL transfers PT goals addressed during session: Mobility/safety with mobility;Balance;Proper use of DME OT goals addressed during session: ADL's and self-care;Proper use of Adaptive equipment and DME      AM-PAC OT "6 Clicks" Daily Activity     Outcome Measure   Help from another person eating meals?: None Help from another person taking care of personal grooming?: A Little Help from another person toileting, which includes using toliet, bedpan, or urinal?: A Lot Help from another person bathing (including washing, rinsing, drying)?: A Lot Help from another person to put on and taking off regular upper body clothing?: A Little Help from another person to put on and taking off regular lower body clothing?: A Lot 6 Click Score: 16    End of Session Equipment Utilized During Treatment: Gait  belt;Rolling walker(Stedy)  OT Visit Diagnosis: Unsteadiness on feet (R26.81);Other abnormalities of gait and mobility (R26.89);Repeated falls (R29.6);Muscle weakness (generalized) (M62.81);History of falling (Z91.81);Pain Pain - part of body: (lumbar spine)   Activity Tolerance Patient tolerated treatment well   Patient Left in chair;with call bell/phone within reach;with chair alarm set   Nurse Communication Mobility status;Other (comment)(use Stedy for bathroom and transfers)        Time: 5701-7793 OT Time Calculation (min): 41 min  Charges: OT General Charges $OT Visit: 1 Visit OT Treatments $Self Care/Home Management : 8-22 mins $Therapeutic Activity: 8-22 mins  Hulda Humphrey OTR/L Acute Rehabilitation Services Pager: [phone] Office: [place] 01/06/2019, 10:53 AM

## 2019-01-06 NOTE — Progress Notes (Signed)
Inpatient Rehabilitation Admissions Coordinator  Inpatient Rehab Consult received. I met with patient at the bedside for rehabilitation assessment. We discussed goals and expectations of an inpatient rehab admission.  He prefers an inpt rehab admit. I will begin insurance authorization with [facility] Pending their approval , could admit today or tomorrow.  Danne Baxter, RN, MSN Rehab Admissions Coordinator [phone] 01/06/2019 12:27 PM

## 2019-01-06 NOTE — Progress Notes (Signed)
Subjective: Patient reports doing well, some moderate back pain. Up ambulating with therapy during exam  Objective: Vital signs in last 24 hours: Temp:  [98.4 F (36.9 C)-99 F (37.2 C)] 98.7 F (37.1 C) (06/08 0741) Pulse Rate:  [71-97] 97 (06/08 0741) Resp:  [16-18] 18 (06/08 0741) BP: (90-117)/(47-72) 112/72 (06/08 0741) SpO2:  [91 %-97 %] 97 % (06/08 0741)  Intake/Output from previous day: 06/07 0701 - 06/08 0700 In: 300 [P.O.:300] Out: 225 [Urine:225] Intake/Output this shift: No intake/output data recorded.  Neurologic: Grossly normal, bilat lower extremity weakness  Lab Results: Lab Results  Component Value Date   WBC 11.8 (H) 12/29/2018   HGB 13.6 12/29/2018   HCT 41.4 12/29/2018   MCV 105.6 (H) 12/29/2018   PLT 189 12/29/2018   Lab Results  Component Value Date   INR 0.96 07/04/2013   BMET Lab Results  Component Value Date   NA 141 12/29/2018   K 3.7 12/29/2018   CL 108 12/29/2018   CO2 24 12/29/2018   GLUCOSE 110 (H) 12/29/2018   BUN 12 12/29/2018   CREATININE 1.11 12/29/2018   CALCIUM 8.6 (L) 12/29/2018    Studies/Results: No results found.  Assessment/Plan: Postop day 4 lumbar decompression. CIR consult ordered   LOS: 1 day    Ocie Cornfield Razan Siler 01/06/2019, 10:01 AM

## 2019-01-06 NOTE — H&P (Signed)
Physical Medicine and Rehabilitation Admission H&P    Chief complaint: Back pain HPI: Paul Pineda is a 73 year old right-handed male history of hypertension and alcohol use as reported by wife.  Per chart review patient lives with spouse and had been receiving home health therapies for decreased mobility related to back pain.  Presented 01/02/2019 with low back pain radiating to left lower extremity x2 weeks.  Denied bowel or bladder disturbances.  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  Underwent sublaminar R decompressive laminectomy L2-3 with left side microdiscectomy of L3 nerve root with partial medial facetectomy and foraminotomy 01/02/2019 per Dr. Saintclair Halsted.  No brace needed.  Hospital course pain management.  Therapy evaluations completed patient was admitted for a comprehensive rehab program.  Review of Systems  Constitutional: Negative for chills and fever.  HENT: Negative for hearing loss.   Eyes: Negative for blurred vision and double vision.  Respiratory: Negative for cough and shortness of breath.   Cardiovascular: Negative for chest pain, palpitations and leg swelling.  Gastrointestinal: Negative for heartburn, nausea and vomiting.  Genitourinary: Negative for dysuria, flank pain and hematuria.  Musculoskeletal: Positive for back pain, falls and myalgias.  Skin: Negative for rash.  All other systems reviewed and are negative.  Past Medical History:  Diagnosis Date  . Arthritis   . Cancer (HCC)    chest lump ,scrotum  . Cataract    right  . Heart murmur   . HOH (hard of hearing)   . Hyperlipidemia   . Hypertension   . Left bundle branch block   . Pneumonia    hx  . Psoriasis    Past Surgical History:  Procedure Laterality Date  . CARDIAC CATHETERIZATION  10/11/2009   no sign CAD, EF 45-50%  . COLONOSCOPY    . INGUINAL HERNIA REPAIR Right 04/29/2013   Procedure: HERNIA REPAIR INGUINAL ADULT;  Surgeon:  Ralene Ok, MD;  Location: [facility];  Service: General;  Laterality: Right;  . INSERTION OF MESH Right 04/29/2013   Procedure: INSERTION OF MESH;  Surgeon: Ralene Ok, MD;  Location: [place];  Service: General;  Laterality: Right;  . LUMBAR LAMINECTOMY/DECOMPRESSION MICRODISCECTOMY Left 01/02/2019   Procedure: Microdiscectomy - L2-L3 - DLL L3-L4 - left LUMBAR TWO-THREE MICRODISCETOMY WITH LEFT LUMBAR THREE-FOUR DECOMPRESSIVE LUMBAR DECOMPRESSIVE LAMINECTOMY;  Surgeon: Kary Kos, MD;  Location: [place];  Service: Neurosurgery;  Laterality: Left;  . lumps     scrotum, chest   . TOTAL KNEE ARTHROPLASTY Right 07/14/2013   Procedure: RIGHT TOTAL KNEE ARTHROPLASTY;  Surgeon: Gearlean Alf, MD;  Location: [facility];  Service: Orthopedics;  Laterality: Right;   Family History  Problem Relation Age of Onset  . Cancer Father        brain  . Psoriasis Sister   . Psoriasis Brother    Social History:  reports that he quit smoking about 33 years ago. His smoking use included cigarettes. He has a 20.00 pack-year smoking history. He has never used smokeless tobacco. He reports previous alcohol use of about 14.0 standard drinks of alcohol per week. He reports that he does not use drugs. Allergies: No Known Allergies Medications Prior to Admission  Medication Sig Dispense Refill  . docusate sodium 100 MG CAPS Take 100 mg by mouth 2 (two) times daily as needed for mild constipation. 60 capsule 0  . hydrOXYzine (ATARAX/VISTARIL) 25 MG tablet Take 1 tablet (25 mg total) by mouth every 6 (six) hours as  needed for itching. (Patient taking differently: Take 25 mg by mouth every 8 (eight) hours as needed for itching. ) 12 tablet 0  . lisinopril (PRINIVIL,ZESTRIL) 10 MG tablet Take 10 mg by mouth daily.    Marland Kitchen LORazepam (ATIVAN) 0.5 MG tablet Take 0.5 mg by mouth 2 (two) times daily.    . metoprolol tartrate (LOPRESSOR) 25 MG tablet Take 1 tablet (25 mg total) by mouth 2 (two) times daily. 60 tablet 3  . Multiple  Vitamins-Minerals (CENTRUM SILVER 50+MEN) TABS Take 1 tablet by mouth daily.    Marland Kitchen tiZANidine (ZANAFLEX) 4 MG capsule Take 4 mg by mouth 3 (three) times daily as needed for muscle spasms.    Marland Kitchen triamcinolone cream (KENALOG) 0.1 % Apply 1 application topically 2 (two) times daily.    Marland Kitchen lisinopril (PRINIVIL,ZESTRIL) 5 MG tablet Take 1 tablet (5 mg total) by mouth daily. (Patient not taking: Reported on 07/26/2018) 30 tablet 3  . traMADol (ULTRAM) 50 MG tablet Take 1-2 tablets (50-100 mg total) by mouth every 6 (six) hours as needed (mild pain). (Patient not taking: Reported on 07/26/2018) 60 tablet 1    Drug Regimen Review Drug regimen was reviewed and remains appropriate with no significant issues identified  Home: Home Living Family/patient expects to be discharged to:: Inpatient rehab Living Arrangements: Spouse/significant other, Children Available Help at Discharge: Friend(s), Available 24 hours/day Type of Home: House Home Access: Stairs to enter [employer] of Steps: 3 Entrance Stairs-Rails: None Home Layout: One level Bathroom Shower/Tub: Gaffer, Chiropodist: Standard Bathroom Accessibility: Yes Home Equipment: Grab bars - toilet, Tub bench, Hand held shower head, Walker - 2 wheels, Transport chair(regular W/C on order) Additional Comments: states he has a 3-wheeled walker, denies having grab bars in shower stall--previous PT notes state that therre are grab bars in bathroom/shower  Lives With: Spouse   Functional History: Prior Function Level of Independence: Independent Gait / Transfers Assistance Needed: ambulating with RW only in past 3 weeks; conmpletely independent before fall ADL's / Homemaking Assistance Needed: has been receiving assist for ADLs; [place] therapies and RN Comments: patient reporting falls nearly everyday due to back pain  Functional Status:  Mobility: Bed Mobility Overal bed mobility: Needs Assistance Bed Mobility:  Supine to Sit Rolling: Min assist Sidelying to sit: Min assist Supine to sit: Supervision Sit to supine: Mod assist, +2 for physical assistance, HOB elevated General bed mobility comments: vc's for precautions. supervision for elevation of trunk to sitting. use of bed rails Transfers Overall transfer level: Needs assistance Equipment used: Ambulation equipment used, Rolling walker (2 wheeled) Transfer via Lift Equipment: Stedy Transfers: Sit to/from Stand Sit to Stand: +2 safety/equipment, Mod assist, Max assist, Min assist, +2 physical assistance Stand pivot transfers: Total assist Squat pivot transfers: Min assist  Lateral/Scoot Transfers: Mod assist General transfer comment: performed sit<>stand with Stedy x3, requiring increased assist with each attempt Mod A to Max A. Then was able to stand with mod A +2 from elevated recliner with RW and cues for hand placement.  Ambulation/Gait Ambulation/Gait assistance: Min assist Gait Distance (Feet): 20 Feet Assistive device: Rolling walker (2 wheeled) Gait Pattern/deviations: Step-through pattern, Decreased stride length, Trunk flexed General Gait Details: Slow, unsteady gait with bil knee instability noted but no buckling. Cues for upright posture. Fatigues.  Gait velocity: decreased    ADL: ADL Overall ADL's : Needs assistance/impaired Eating/Feeding: Independent, Sitting Grooming: Set up, Sitting Grooming Details (indicate cue type and reason): in recliner Upper Body Bathing: Moderate  assistance, Sitting Upper Body Bathing Details (indicate cue type and reason): for back Lower Body Bathing: Moderate assistance Lower Body Bathing Details (indicate cue type and reason): with Mod A sit<>stand Upper Body Dressing : Set up, Supervision/safety, Sitting Lower Body Dressing: Minimal assistance, With adaptive equipment, Cueing for safety, Cueing for sequencing, Sitting/lateral leans Lower Body Dressing Details (indicate cue type and  reason): LB dressing with AE provided in sitting Toilet Transfer: Moderate assistance, +2 for safety/equipment Toilet Transfer Details (indicate cue type and reason): use of Stedy Toileting- Clothing Manipulation and Hygiene: Moderate assistance, Sit to/from stand Toileting - Clothing Manipulation Details (indicate cue type and reason): Pt dependent on BUE for balance, OT performing rear peri care Functional mobility during ADLs: Moderate assistance, +2 for physical assistance, +2 for safety/equipment, Rolling walker General ADL Comments: Pt improving in activity tolerance and ability to sit<>stand, needs further education for compensatory strategies for maintaining back precautions with ADL  Cognition: Cognition Overall Cognitive Status: Impaired/Different from baseline Orientation Level: Oriented X4 Cognition Arousal/Alertness: Awake/alert Behavior During Therapy: WFL for tasks assessed/performed Overall Cognitive Status: Impaired/Different from baseline Area of Impairment: Safety/judgement Safety/Judgement: Decreased awareness of safety, Decreased awareness of deficits General Comments: Pt is keen to go home but admits frequent falls, and says "I'll just lay there until therapy comes"  Physical Exam: Blood pressure (!) 102/50, pulse 85, temperature 99 F (37.2 C), temperature source Oral, resp. rate 18, height 5\' 10"  (1.778 m), weight 98.4 kg, SpO2 95 %. Physical Exam  Constitutional: He appears well-developed. No distress.  HENT:  Head: Normocephalic and atraumatic.  Eyes: Pupils are equal, round, and reactive to light. EOM are normal.  Neck: Normal range of motion. No thyromegaly present.  Cardiovascular: Normal rate and regular rhythm.  No murmur heard. Respiratory: Effort normal. No respiratory distress. He has no wheezes.  GI: Soft. He exhibits no distension. There is no abdominal tenderness.  Musculoskeletal: Normal range of motion.        General: No edema.  Neurological:   Alert.  Follows commands.  Oriented to person place and time. UE 5/5. LE 3/5 HF, KE and 4/5 ADF/PF. Senses pain and LT in all 4's. DTR's trace to 1+.   Skin:  Back incision is dressed. Skin with diffuse erythema and severe flaking.   Psychiatric: He has a normal mood and affect. His behavior is normal. Judgment and thought content normal.    No results found for this or any previous visit (from the past 48 hour(s)). No results found.     Medical Problem List and Plan: 1.  Decreased functional mobility secondary to HNP L2-3 with 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  Status post laminotomy L3-4 with microdissection nerve root decompression partial foraminotomies 01/02/2019.  No back brace needed.  -admit to inpatient rehab 2.  Antithrombotics: -DVT/anticoagulation: SCDs.  Check vascular study  -antiplatelet therapy: N/A 3. Pain Management: Oxycodone and Ultram as needed, Zanaflex 4 mg 3 times daily as needed. Pain control appears adequate at present. 4. Mood: Ativan 0.5 mg twice daily  -antipsychotic agents: N/A 5. Neuropsych: This patient is capable of making decisions on his own behalf. 6. Skin/Wound Care: Routine skin checks 7. Fluids/Electrolytes/Nutrition: Routine in and outs with follow-up chemistries 8.  Hypertension.  Lisinopril 10 mg daily, Lopressor 25 mg twice daily.  Monitor with increased mobility 10.Alcohol use.Counseling 11.  Constipation.  Laxative assistance 12.  Psoriasis.  Kenalog cream twice daily     Cathlyn Parsons, PA-C 01/07/2019

## 2019-01-07 ENCOUNTER — Inpatient Hospital Stay (HOSPITAL_COMMUNITY)
Admission: RE | Admit: 2019-01-07 | Discharge: 2019-01-17 | DRG: 561 | Disposition: A | Discharge status: Home Health | Payer: Medicare Other | Source: Intra-hospital | Attending: Physical Medicine & Rehabilitation | Admitting: Physical Medicine & Rehabilitation

## 2019-01-07 ENCOUNTER — Other Ambulatory Visit: Payer: Self-pay

## 2019-01-07 ENCOUNTER — Encounter (HOSPITAL_COMMUNITY): Payer: Self-pay

## 2019-01-07 DIAGNOSIS — Z96651 Presence of right artificial knee joint: Secondary | ICD-10-CM | POA: Diagnosis not present

## 2019-01-07 DIAGNOSIS — I824Z2 Acute embolism and thrombosis of unspecified deep veins of left distal lower extremity: Secondary | ICD-10-CM | POA: Diagnosis not present

## 2019-01-07 DIAGNOSIS — M7989 Other specified soft tissue disorders: Secondary | ICD-10-CM | POA: Diagnosis not present

## 2019-01-07 DIAGNOSIS — I10 Essential (primary) hypertension: Secondary | ICD-10-CM | POA: Diagnosis not present

## 2019-01-07 DIAGNOSIS — Z87891 Personal history of nicotine dependence: Secondary | ICD-10-CM | POA: Diagnosis not present

## 2019-01-07 DIAGNOSIS — M5416 Radiculopathy, lumbar region: Secondary | ICD-10-CM | POA: Diagnosis present

## 2019-01-07 DIAGNOSIS — H9190 Unspecified hearing loss, unspecified ear: Secondary | ICD-10-CM | POA: Diagnosis not present

## 2019-01-07 DIAGNOSIS — E785 Hyperlipidemia, unspecified: Secondary | ICD-10-CM | POA: Diagnosis present

## 2019-01-07 DIAGNOSIS — I447 Left bundle-branch block, unspecified: Secondary | ICD-10-CM | POA: Diagnosis present

## 2019-01-07 DIAGNOSIS — K5900 Constipation, unspecified: Secondary | ICD-10-CM | POA: Diagnosis not present

## 2019-01-07 DIAGNOSIS — M1990 Unspecified osteoarthritis, unspecified site: Secondary | ICD-10-CM | POA: Diagnosis not present

## 2019-01-07 DIAGNOSIS — Z4789 Encounter for other orthopedic aftercare: Principal | ICD-10-CM

## 2019-01-07 DIAGNOSIS — I82402 Acute embolism and thrombosis of unspecified deep veins of left lower extremity: Secondary | ICD-10-CM | POA: Diagnosis not present

## 2019-01-07 DIAGNOSIS — L409 Psoriasis, unspecified: Secondary | ICD-10-CM | POA: Diagnosis present

## 2019-01-07 DIAGNOSIS — D62 Acute posthemorrhagic anemia: Secondary | ICD-10-CM | POA: Diagnosis not present

## 2019-01-07 DIAGNOSIS — M10071 Idiopathic gout, right ankle and foot: Secondary | ICD-10-CM | POA: Diagnosis not present

## 2019-01-07 DIAGNOSIS — Z808 Family history of malignant neoplasm of other organs or systems: Secondary | ICD-10-CM | POA: Diagnosis not present

## 2019-01-07 MED ORDER — TRAMADOL HCL 50 MG PO TABS
50.0000 mg | ORAL_TABLET | Freq: Four times a day (QID) | ORAL | Status: DC | PRN
  Administered 2019-01-08 – 2019-01-10 (×3): 100 mg via ORAL
  Administered 2019-01-12 – 2019-01-13 (×2): 50 mg via ORAL
  Filled 2019-01-07 (×2): qty 2
  Filled 2019-01-07: qty 1
  Filled 2019-01-07 (×2): qty 2

## 2019-01-07 MED ORDER — PANTOPRAZOLE SODIUM 40 MG PO TBEC
40.0000 mg | DELAYED_RELEASE_TABLET | Freq: Every day | ORAL | Status: DC
  Administered 2019-01-07 – 2019-01-16 (×10): 40 mg via ORAL
  Filled 2019-01-07 (×10): qty 1

## 2019-01-07 MED ORDER — HYDROXYZINE HCL 25 MG PO TABS
25.0000 mg | ORAL_TABLET | Freq: Three times a day (TID) | ORAL | Status: DC | PRN
  Administered 2019-01-07 – 2019-01-14 (×11): 25 mg via ORAL
  Filled 2019-01-07 (×11): qty 1

## 2019-01-07 MED ORDER — TRIAMCINOLONE ACETONIDE 0.1 % EX CREA: 1.0000 "application " | TOPICAL_CREAM | Freq: Two times a day (BID) | CUTANEOUS | Status: DC

## 2019-01-07 MED ORDER — HYDROCORTISONE 1 % EX CREA
TOPICAL_CREAM | Freq: Three times a day (TID) | CUTANEOUS | Status: DC
  Administered 2019-01-08 – 2019-01-10 (×7): via TOPICAL
  Administered 2019-01-11: 1 via TOPICAL
  Administered 2019-01-11 (×2): via TOPICAL
  Administered 2019-01-12: 1 via TOPICAL
  Administered 2019-01-12: 07:00:00 via TOPICAL
  Administered 2019-01-12: 1 via TOPICAL
  Administered 2019-01-13 – 2019-01-17 (×12): via TOPICAL
  Filled 2019-01-07 (×4): qty 28

## 2019-01-07 MED ORDER — ONDANSETRON HCL 4 MG PO TABS: 4.0000 mg | ORAL_TABLET | Freq: Four times a day (QID) | ORAL | Status: DC | PRN

## 2019-01-07 MED ORDER — TIZANIDINE HCL 4 MG PO TABS
4.0000 mg | ORAL_TABLET | Freq: Three times a day (TID) | ORAL | Status: DC | PRN
  Administered 2019-01-10: 4 mg via ORAL
  Filled 2019-01-07 (×2): qty 1

## 2019-01-07 MED ORDER — TIZANIDINE HCL 4 MG PO TABS: 4.0000 mg | ORAL_TABLET | Freq: Three times a day (TID) | ORAL | 1 refills | Status: DC

## 2019-01-07 MED ORDER — ADULT MULTIVITAMIN W/MINERALS CH
1.0000 | ORAL_TABLET | Freq: Every day | ORAL | Status: DC
  Administered 2019-01-08 – 2019-01-17 (×10): 1 via ORAL
  Filled 2019-01-07 (×10): qty 1

## 2019-01-07 MED ORDER — LISINOPRIL 10 MG PO TABS
10.0000 mg | ORAL_TABLET | Freq: Every day | ORAL | Status: DC
  Administered 2019-01-08 – 2019-01-17 (×10): 10 mg via ORAL
  Filled 2019-01-07 (×10): qty 1

## 2019-01-07 MED ORDER — ACETAMINOPHEN 650 MG RE SUPP: 650.0000 mg | RECTAL | Status: DC | PRN

## 2019-01-07 MED ORDER — OXYCODONE HCL 5 MG PO TABS
10.0000 mg | ORAL_TABLET | ORAL | Status: DC | PRN
  Administered 2019-01-07 – 2019-01-08 (×3): 10 mg via ORAL
  Filled 2019-01-07 (×4): qty 2

## 2019-01-07 MED ORDER — ACETAMINOPHEN 325 MG PO TABS
650.0000 mg | ORAL_TABLET | ORAL | Status: DC | PRN
  Administered 2019-01-08 – 2019-01-14 (×5): 650 mg via ORAL
  Filled 2019-01-07 (×4): qty 2

## 2019-01-07 MED ORDER — METOPROLOL TARTRATE 25 MG PO TABS
25.0000 mg | ORAL_TABLET | Freq: Two times a day (BID) | ORAL | Status: DC
  Administered 2019-01-07 – 2019-01-17 (×20): 25 mg via ORAL
  Filled 2019-01-07 (×20): qty 1

## 2019-01-07 MED ORDER — ONDANSETRON HCL 4 MG/2ML IJ SOLN: 4.0000 mg | Freq: Four times a day (QID) | INTRAMUSCULAR | Status: DC | PRN

## 2019-01-07 MED ORDER — CAMPHOR-MENTHOL 0.5-0.5 % EX LOTN
TOPICAL_LOTION | CUTANEOUS | Status: DC | PRN
  Administered 2019-01-07 – 2019-01-14 (×3): via TOPICAL
  Filled 2019-01-07 (×2): qty 222

## 2019-01-07 MED ORDER — LORAZEPAM 0.5 MG PO TABS
0.5000 mg | ORAL_TABLET | Freq: Two times a day (BID) | ORAL | Status: DC
  Administered 2019-01-07 – 2019-01-17 (×20): 0.5 mg via ORAL
  Filled 2019-01-07 (×20): qty 1

## 2019-01-07 MED ORDER — SORBITOL 70 % SOLN: 30.0000 mL | Freq: Every day | Status: DC | PRN

## 2019-01-07 MED ORDER — HYDROCERIN EX CREA
TOPICAL_CREAM | Freq: Two times a day (BID) | CUTANEOUS | Status: DC
  Administered 2019-01-07 – 2019-01-08 (×2): via TOPICAL
  Administered 2019-01-08: 1 via TOPICAL
  Administered 2019-01-09 – 2019-01-11 (×5): via TOPICAL
  Administered 2019-01-11: 1 via TOPICAL
  Administered 2019-01-13 – 2019-01-17 (×8): via TOPICAL
  Filled 2019-01-07 (×3): qty 113

## 2019-01-07 MED ORDER — HYDROCODONE-ACETAMINOPHEN 5-325 MG PO TABS: 1.0000 | ORAL_TABLET | Freq: Four times a day (QID) | ORAL | 0 refills | Status: DC | PRN

## 2019-01-07 MED ORDER — SENNA 8.6 MG PO TABS
1.0000 | ORAL_TABLET | Freq: Every day | ORAL | Status: DC
  Administered 2019-01-08 – 2019-01-16 (×9): 8.6 mg via ORAL
  Filled 2019-01-07 (×10): qty 1

## 2019-01-07 NOTE — Progress Notes (Signed)
Meredith Staggers, MD  Physician  Physical Medicine and Rehabilitation  PMR Pre-admission  Signed  Date of Service:  01/06/2019 2:37 PM       Related encounter: Admission (Discharged) from 01/02/2019 in [facility]         Show:Clear all [x] Manual[x] Template[x] Copied  Added by: [x] Cristina Gong, RN[x] Meredith Staggers, MD  [] Hover for details PMR Admission Coordinator Pre-Admission Assessment  Patient: Paul Pineda is an 73 y.o., male MRN: [id number] DOB: 1945-09-26 Height: 5' 10"  (177.8 cm) Weight: 98.4 kg  Insurance Information HMO: yes    PPO:      PCP:      IPA:      80/20:      OTHER: medicare advantage plan PRIMARY: Blue Medicare      Policy#: [id number]      Subscriber: pt CM Name: Cedric      Phone#: [phone]     Fax#: [phone] Pre-Cert#: TBD f/u in 7 days     Employer:  Benefits:  Phone #: [phone]     Name: 6/8 Eff. Date: 07/31/2018     Deduct: none      Out of Pocket Max: $3900      Life Max: none CIR: $310 co pay per day days 1 until 6      SNF: no co pay days 1 until 20; $178 co pay per day days 21 until 60; no co pay days 61 until 100 Outpatient: $40 co pay per visit     Co-Pay: visits per medical neccesity Home Health: 1005      Co-Pay: visits per medical neccesity DME: 80%     Co-Pay: 205 Providers: in network  SECONDARY: none       Medicaid Application Date:       Case Manager:  Disability Application Date:       Case Worker:   The "Data Collection Information Summary" for patients in Inpatient Rehabilitation Facilities with attached "Privacy Act [place] Records" was provided and verbally reviewed with: Patient  Emergency Contact Information         Contact Information    Name Relation Home Work Mobile   Ercole,Carol Spouse [phone]  [phone]   Densil, Ottey Daughter   [phone]   Wasilewski,katie Daughter   [phone]      Current Medical History   Patient Admitting Diagnosis: lumbar discectomy and radiculopathy  History of Present Illness:Deguzman is a 73 year old right-handed male history of hypertension. History of a fall 3 weeks pta with worsening back pain and mobility. Multiple falls during that time due to deficits.Presented 01/02/2019 with low back pain radiating to left lower extremity x2 weeks. Denied bowel or bladder disturbances. 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 Underwent sublaminar R decompressive laminectomy L2-3 with left side microdiscectomy of L3 nerve root with partial medial facetectomyand foraminotomy 01/02/2019 per Dr. Saintclair Halsted. No brace needed. Hospital course pain management.    On day of admit, patient reluctant to admit for 7 days, but after therapy session and further discussions with wife and [facility], agreed to admit.  Patient's medical record from [facility]  has been reviewed by the rehabilitation admission coordinator and physician.  Past Medical History      Past Medical History:  Diagnosis Date  . Arthritis   . Cancer (HCC)    chest lump ,scrotum  . Cataract    right  . Heart murmur   .  HOH (hard of hearing)   . Hyperlipidemia   . Hypertension   . Left bundle branch block   . Pneumonia    hx  . Psoriasis     Family History   family history includes Cancer in his father; Psoriasis in his brother and sister.  Prior Rehab/Hospitalizations Has the patient had prior rehab or hospitalizations prior to admission? Yes  Has the patient had major surgery during 100 days prior to admission? Yes             Current Medications  Current Facility-Administered Medications:  .  0.9 %  sodium chloride infusion, 250 mL, Intravenous, Continuous, Kary Kos, MD .  acetaminophen (TYLENOL) tablet 650 mg, 650 mg, Oral, Q4H PRN **OR** acetaminophen (TYLENOL) suppository 650 mg, 650 mg, Rectal, Q4H PRN, Kary Kos, MD .   alum & mag hydroxide-simeth (MAALOX/MYLANTA) 200-200-20 MG/5ML suspension 30 mL, 30 mL, Oral, Q6H PRN, Kary Kos, MD .  cyclobenzaprine (FLEXERIL) tablet 10 mg, 10 mg, Oral, TID PRN, Kary Kos, MD, 10 mg at 01/03/19 0255 .  desonide (DESOWEN) 0.05 % ointment, , Topical, BID, Meyran, Ocie Cornfield, NP .  docusate sodium (COLACE) capsule 100 mg, 100 mg, Oral, BID, Meyran, Ocie Cornfield, NP, 100 mg at 01/07/19 0903 .  HYDROmorphone (DILAUDID) injection 1 mg, 1 mg, Intravenous, Q2H PRN, Kary Kos, MD .  hydrOXYzine (ATARAX/VISTARIL) tablet 25 mg, 25 mg, Oral, Q8H PRN, Kary Kos, MD, 25 mg at 01/05/19 1843 .  lisinopril (ZESTRIL) tablet 10 mg, 10 mg, Oral, Daily, Kary Kos, MD, 10 mg at 01/07/19 0905 .  LORazepam (ATIVAN) tablet 0.5 mg, 0.5 mg, Oral, BID, Kary Kos, MD, 0.5 mg at 01/07/19 0904 .  menthol-cetylpyridinium (CEPACOL) lozenge 3 mg, 1 lozenge, Oral, PRN **OR** phenol (CHLORASEPTIC) mouth spray 1 spray, 1 spray, Mouth/Throat, PRN, Kary Kos, MD .  metoprolol tartrate (LOPRESSOR) tablet 25 mg, 25 mg, Oral, BID, Kary Kos, MD, 25 mg at 01/07/19 0903 .  multivitamin with minerals tablet 1 tablet, 1 tablet, Oral, Daily, Kary Kos, MD, 1 tablet at 01/07/19 0904 .  ondansetron (ZOFRAN) tablet 4 mg, 4 mg, Oral, Q6H PRN **OR** ondansetron (ZOFRAN) injection 4 mg, 4 mg, Intravenous, Q6H PRN, Kary Kos, MD .  oxyCODONE (Oxy IR/ROXICODONE) immediate release tablet 10 mg, 10 mg, Oral, Q3H PRN, Kary Kos, MD, 10 mg at 01/07/19 0904 .  pantoprazole (PROTONIX) EC tablet 40 mg, 40 mg, Oral, QHS, Kary Kos, MD, 40 mg at 01/06/19 2149 .  senna (SENOKOT) tablet 8.6 mg, 1 tablet, Oral, Daily, Meyran, Ocie Cornfield, NP, 8.6 mg at 01/07/19 0905 .  sodium chloride flush (NS) 0.9 % injection 3 mL, 3 mL, Intravenous, Q12H, Kary Kos, MD, 3 mL at 01/07/19 0905 .  sodium chloride flush (NS) 0.9 % injection 3 mL, 3 mL, Intravenous, PRN, Kary Kos, MD .  tiZANidine (ZANAFLEX) tablet 4 mg, 4 mg, Oral,  TID PRN, Kary Kos, MD, 4 mg at 01/05/19 0939 .  traMADol (ULTRAM) tablet 50-100 mg, 50-100 mg, Oral, Q6H PRN, Kary Kos, MD, 100 mg at 01/06/19 0814 .  triamcinolone cream (KENALOG) 0.1 % 1 application, 1 application, Topical, BID, Kary Kos, MD, 1 application at 81/19/14 7829  Patients Current Diet:     Diet Order                  Diet - low sodium heart healthy         Diet regular Room service appropriate? Yes; Fluid consistency: Thin  Diet effective now  Precautions / Restrictions Precautions Precautions: Fall, Back Precaution Booklet Issued: No Precaution Comments: Reviewed precautions Spinal Brace: (no brace needed per order) Restrictions Weight Bearing Restrictions: No   Has the patient had 2 or more falls or a fall with injury in the past year? Yes. Initial fall 3 weeks pta. Now falling daily  Prior Activity Level Community (5-7x/wk): Independent without AD prior to initial fall 3 weeks ago(works part time driving trucks)  Prior Functional Level Self Care: Did the patient need help bathing, dressing, using the toilet or eating? Independent  Indoor Mobility: Did the patient need assistance with walking from room to room (with or without device)? Independent  Stairs: Did the patient need assistance with internal or external stairs (with or without device)? Independent  Functional Cognition: Did the patient need help planning regular tasks such as shopping or remembering to take medications? [place] / Equipment Home Assistive Devices/Equipment: Environmental consultant (specify type), Cane (specify quad or straight), Wheelchair, Hand-held shower hose, Shower chair with back Home Equipment: Grab bars - toilet, Tub bench, Hand held shower head, Walker - 2 wheels, Transport chair(regular W/C on order)  Prior Device Use: Indicate devices/aids used by the patient prior to current illness, exacerbation or injury? None of the  above  Current Functional Level Cognition  Overall Cognitive Status: Impaired/Different from baseline Orientation Level: Oriented X4 Following Commands: Follows multi-step commands inconsistently, Follows one step commands with increased time Safety/Judgement: Decreased awareness of safety, Decreased awareness of deficits General Comments: Reports feeling very confused yesterday and not able to recall walking for the first time or the 3 way call with wife and CIR coordinator. "I am in some med center" Pt not sure why he is in the hospital, "I had to come back and get something." No awareness of safety/deficits. Requires 2 people to stand and pt reports his neighbors can come over and help him walk.    Extremity Assessment (includes Sensation/Coordination)  Upper Extremity Assessment: Generalized weakness  Lower Extremity Assessment: Defer to PT evaluation RLE Deficits / Details: b/l knees buckling LLE Deficits / Details: b/l knees buckling    ADLs  Overall ADL's : Needs assistance/impaired Eating/Feeding: Independent, Sitting Grooming: Set up, Sitting Grooming Details (indicate cue type and reason): in recliner Upper Body Bathing: Moderate assistance, Sitting Upper Body Bathing Details (indicate cue type and reason): for back Lower Body Bathing: Moderate assistance Lower Body Bathing Details (indicate cue type and reason): with Mod A sit<>stand Upper Body Dressing : Set up, Supervision/safety, Sitting Lower Body Dressing: Minimal assistance, With adaptive equipment, Cueing for safety, Cueing for sequencing, Sitting/lateral leans Lower Body Dressing Details (indicate cue type and reason): LB dressing with AE provided in sitting Toilet Transfer: Moderate assistance, +2 for safety/equipment Toilet Transfer Details (indicate cue type and reason): use of Stedy Toileting- Clothing Manipulation and Hygiene: Moderate assistance, Sit to/from stand Toileting - Clothing Manipulation Details  (indicate cue type and reason): Pt dependent on BUE for balance, OT performing rear peri care Functional mobility during ADLs: Moderate assistance, +2 for physical assistance, +2 for safety/equipment, Rolling walker General ADL Comments: Pt improving in activity tolerance and ability to sit<>stand, needs further education for compensatory strategies for maintaining back precautions with ADL    Mobility  Overal bed mobility: Needs Assistance Bed Mobility: Supine to Sit Rolling: Min assist Sidelying to sit: Min assist Supine to sit: Supervision Sit to supine: Mod assist, +2 for physical assistance, HOB elevated General bed mobility comments: vc's for precautions and log roll  technique. supervision for elevation of trunk to sitting. use of bed rails. not following commands to do log roll technique.    Transfers  Overall transfer level: Needs assistance Equipment used: Rolling walker (2 wheeled) Transfer via Lift Equipment: Stedy Transfers: Sit to/from Stand Sit to Stand: Max assist, +2 physical assistance Stand pivot transfers: Total assist Squat pivot transfers: Min assist  Lateral/Scoot Transfers: Mod assist General transfer comment: Heavy Max A of 2 to stand from EOB with cues for technique/hand placement. Pt leaning posteriorly with increased hip/knee flexion. Transferred to chair post ambulation.    Ambulation / Gait / Stairs / Wheelchair Mobility  Ambulation/Gait Ambulation/Gait assistance: Herbalist (Feet): 40 Feet Assistive device: Rolling walker (2 wheeled) Gait Pattern/deviations: Step-through pattern, Decreased stride length, Trunk flexed General Gait Details: Slow, unsteady gait with bil knee instability noted but no buckling. Cues for upright posture. Fatigues.  Gait velocity: decreased    Posture / Balance Dynamic Sitting Balance Sitting balance - Comments: reliant on UE support to maintain balance in sitting today Balance Overall balance  assessment: Needs assistance, History of Falls Sitting-balance support: Feet supported, No upper extremity supported Sitting balance-Leahy Scale: Fair Sitting balance - Comments: reliant on UE support to maintain balance in sitting today Standing balance support: Bilateral upper extremity supported, During functional activity Standing balance-Leahy Scale: Poor Standing balance comment: dependent on BUE in standing and RW.     Special needs/care consideration BiPAP/CPAP  N/a CPM  N/a Continuous Drip IV  N/a Dialysis n/a Life Vest  N/a Oxygen  N/a Special Bed  N/a Trach Size  N/a Wound Vac n/a  Skin  Red dry flaky skin; surgical incision. 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 which is leading to some of his want to go home asap   Previous Home Environment  Living Arrangements: Spouse/significant other, Children  Lives With: Spouse Available Help at Discharge: Friend(s), Available 24 hours/day Type of Home: [place]: One level Home Access: Stairs to enter Entrance Stairs-Rails: None Technical brewer of Steps: 3 Bathroom Shower/Tub: Gaffer, Chiropodist: Standard Bathroom Accessibility: Yes How Accessible: Accessible via walker [place]: Yes Type of Home Care Services: Home RN, Home PT Additional Comments: states he has a 3-wheeled walker, denies having grab bars in shower stall--previous PT notes state that therre are grab bars in bathroom/shower   Wife works but has been off for three weeks  Discharge Living Setting Plans for Discharge Living Setting: Patient's home, Lives with (comment)(wife) Type of Home at Discharge: House Discharge Home Layout: One level Discharge Home Access: Stairs  to enter Entrance Stairs-Rails: None Entrance Stairs-Number of Steps: 3 Discharge Bathroom Shower/Tub: Walk-in shower Discharge Bathroom Toilet: Standard Does the patient have any problems obtaining your medications?: No  Social/Family/Support Systems Patient Roles: Spouse, 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 Information: wife, Arbie Cookey Anticipated Caregiver: wife Anticipated Ambulance person Information: see above Caregiver Availability: 24/7 Discharge Plan Discussed with Primary Caregiver: Yes Is Caregiver In Agreement with Plan?: Yes Does Caregiver/Family have Issues with Lodging/Transportation while Pt is in Rehab?: No  Goals/Additional Needs Patient/Family Goal for Rehab: supervision to min with PT and OT Expected length of stay: ELOS 10 to 12 days; Patietn states he will stay 7 days and reassess Pt/Family Agrees  to Admission and willing to participate: Yes Program Orientation Provided & Reviewed with Pt/Caregiver Including Roles  & Responsibilities: Yes  Decrease burden of Care through IP rehab admission: n/a  Possible need for SNF placement upon discharge:  Not anticipated  Patient Condition: I have reviewed medical records from [facility] , spoken with CM, and patient. I met with patient at the bedside for inpatient rehabilitation assessment.  Patient will benefit from ongoing PT and OT, can actively participate in 3 hours of therapy a day 5 days of the week, and can make measurable gains during the admission.  Patient will also benefit from the coordinated team approach during an Inpatient Acute Rehabilitation admission.  The patient will receive intensive therapy as well as Rehabilitation physician, nursing, social worker, and care management interventions.  Due to bladder management, bowel management, safety, skin/wound care, disease management, medication administration, pain management and patient education the patient requires 24 hour a day rehabilitation  nursing.  The patient is currently max assist with mobility and basic ADLs.  Discharge setting and therapy post discharge at home with home health is anticipated.  Patient has agreed to participate in the Acute Inpatient Rehabilitation Program and will admit today.  Preadmission Screen Completed By:  Cleatrice Burke RN MSN 01/07/2019 12:04 PM ______________________________________________________________________   Discussed status with Dr. Naaman Plummer  on 01/07/2019 at  1205 and received approval for admission today.  Admission Coordinator:  Cleatrice Burke, RN MSN, time 1610 Date  01/07/2019   Assessment/Plan: Diagnosis: lumbar stenosis d/t HNP with radiculopathy 1. Does the need for close, 24 hr/day Medical supervision in concert with the patient's rehab needs make it unreasonable for this patient to be served in a less intensive setting? Yes 2. Co-Morbidities requiring supervision/potential complications: HTN, pain  mgt 3. Due to bladder management, bowel management, safety, skin/wound care, disease management, medication administration, pain management and patient education, does the patient require 24 hr/day rehab nursing? Yes 4. Does the patient require coordinated care of a physician, rehab nurse, PT (1-2 hrs/day, 5 days/week) and OT (1-2 hrs/day, 5 days/week) to address physical and functional deficits in the context of the above medical diagnosis(es)? Yes Addressing deficits in the following areas: balance, endurance, locomotion, strength, transferring, bowel/bladder control, bathing, dressing, feeding, grooming, toileting and psychosocial support 5. Can the patient actively participate in an intensive therapy program of at least 3 hrs of therapy 5 days a week? Yes 6. The potential for patient to make measurable gains while on inpatient rehab is excellent 7. Anticipated functional outcomes upon discharge from inpatients are: supervision and min assist PT, supervision and min assist  OT, n/a SLP 8. Estimated rehab length of stay to reach the above functional goals is: 10-12 days 9. Anticipated D/C setting: Home 10. Anticipated post D/C treatments: [place] therapy 11. Overall Rehab/Functional Prognosis: excellent  MD Signature: Meredith Staggers, MD, [place] [facility] 01/07/2019         Revision History

## 2019-01-07 NOTE — Progress Notes (Signed)
Inpatient Rehabilitation Admissions Coordinator  I was notified by nursing, Jana Half, that pt has changed his mind about CIR admit after therapy session. I returned and spoke with patient and he is now in agreement to admit to CIR. I contacted his wife by phone and she is aware. . I notified Margo Aye, NP and will make the arrangements to admit today.  Danne Baxter, RN, MSN Rehab Admissions Coordinator [phone] 01/07/2019 11:58 AM

## 2019-01-07 NOTE — Plan of Care (Signed)

## 2019-01-07 NOTE — H&P (Signed)
Physical Medicine and Rehabilitation Admission H&P     Chief complaint: Back pain HPI: Paul Pineda is a 73 year old right-handed male history of hypertension and alcohol use as reported by wife.  Per chart review patient lives with spouse and had been receiving home health therapies for decreased mobility related to back pain.  Presented 01/02/2019 with low back pain radiating to left lower extremity x2 weeks.  Denied bowel or bladder disturbances.  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  Underwent sublaminar R decompressive laminectomy L2-3 with left side microdiscectomy of L3 nerve root with partial medial facetectomy and foraminotomy 01/02/2019 per Dr. Saintclair Halsted.  No brace needed.  Hospital course pain management.  Therapy evaluations completed patient was admitted for a comprehensive rehab program.   Review of Systems  Constitutional: Negative for chills and fever.  HENT: Negative for hearing loss.   Eyes: Negative for blurred vision and double vision.  Respiratory: Negative for cough and shortness of breath.   Cardiovascular: Negative for chest pain, palpitations and leg swelling.  Gastrointestinal: Negative for heartburn, nausea and vomiting.  Genitourinary: Negative for dysuria, flank pain and hematuria.  Musculoskeletal: Positive for back pain, falls and myalgias.  Skin: Negative for rash.  All other systems reviewed and are negative.       Past Medical History:  Diagnosis Date  . Arthritis    . Cancer (HCC)      chest lump ,scrotum  . Cataract      right  . Heart murmur    . HOH (hard of hearing)    . Hyperlipidemia    . Hypertension    . Left bundle branch block    . Pneumonia      hx  . Psoriasis           Past Surgical History:  Procedure Laterality Date  . CARDIAC CATHETERIZATION   10/11/2009    no sign CAD, EF 45-50%  . COLONOSCOPY      . INGUINAL HERNIA REPAIR Right 04/29/2013    Procedure: HERNIA  REPAIR INGUINAL ADULT;  Surgeon: Ralene Ok, MD;  Location: [facility];  Service: General;  Laterality: Right;  . INSERTION OF MESH Right 04/29/2013    Procedure: INSERTION OF MESH;  Surgeon: Ralene Ok, MD;  Location: [facility];  Service: General;  Laterality: Right;  . LUMBAR LAMINECTOMY/DECOMPRESSION MICRODISCECTOMY Left 01/02/2019    Procedure: Microdiscectomy - L2-L3 - DLL L3-L4 - left LUMBAR TWO-THREE MICRODISCETOMY WITH LEFT LUMBAR THREE-FOUR DECOMPRESSIVE LUMBAR DECOMPRESSIVE LAMINECTOMY;  Surgeon: Kary Kos, MD;  Location: [facility];  Service: Neurosurgery;  Laterality: Left;  . lumps        scrotum, chest   . TOTAL KNEE ARTHROPLASTY Right 07/14/2013    Procedure: RIGHT TOTAL KNEE ARTHROPLASTY;  Surgeon: Gearlean Alf, MD;  Location: [facility];  Service: Orthopedics;  Laterality: Right;         Family History  Problem Relation Age of Onset  . Cancer Father          brain  . Psoriasis Sister    . Psoriasis Brother      Social History:  reports that he quit smoking about 33 years ago. His smoking use included cigarettes. He has a 20.00 pack-year smoking history. He has never used smokeless tobacco. He reports previous alcohol use of about 14.0 standard drinks of alcohol per week. He reports that he does not use drugs. Allergies: No Known Allergies  Medications Prior to Admission  Medication Sig Dispense Refill  . docusate sodium 100 MG CAPS Take 100 mg by mouth 2 (two) times daily as needed for mild constipation. 60 capsule 0  . hydrOXYzine (ATARAX/VISTARIL) 25 MG tablet Take 1 tablet (25 mg total) by mouth every 6 (six) hours as needed for itching. (Patient taking differently: Take 25 mg by mouth every 8 (eight) hours as needed for itching. ) 12 tablet 0  . lisinopril (PRINIVIL,ZESTRIL) 10 MG tablet Take 10 mg by mouth daily.      Marland Kitchen LORazepam (ATIVAN) 0.5 MG tablet Take 0.5 mg by mouth 2 (two) times daily.      . metoprolol tartrate (LOPRESSOR) 25 MG tablet Take 1 tablet (25 mg  total) by mouth 2 (two) times daily. 60 tablet 3  . Multiple Vitamins-Minerals (CENTRUM SILVER 50+MEN) TABS Take 1 tablet by mouth daily.      Marland Kitchen tiZANidine (ZANAFLEX) 4 MG capsule Take 4 mg by mouth 3 (three) times daily as needed for muscle spasms.      Marland Kitchen triamcinolone cream (KENALOG) 0.1 % Apply 1 application topically 2 (two) times daily.      Marland Kitchen lisinopril (PRINIVIL,ZESTRIL) 5 MG tablet Take 1 tablet (5 mg total) by mouth daily. (Patient not taking: Reported on 07/26/2018) 30 tablet 3  . traMADol (ULTRAM) 50 MG tablet Take 1-2 tablets (50-100 mg total) by mouth every 6 (six) hours as needed (mild pain). (Patient not taking: Reported on 07/26/2018) 60 tablet 1      Drug Regimen Review Drug regimen was reviewed and remains appropriate with no significant issues identified   Home: Home Living Family/patient expects to be discharged to:: Inpatient rehab Living Arrangements: Spouse/significant other, Children Available Help at Discharge: Friend(s), Available 24 hours/day Type of Home: House Home Access: Stairs to enter [employer] of Steps: 3 Entrance Stairs-Rails: None Home Layout: One level Bathroom Shower/Tub: Gaffer, Chiropodist: Standard Bathroom Accessibility: Yes Home Equipment: Grab bars - toilet, Tub bench, Hand held shower head, Walker - 2 wheels, Transport chair(regular W/C on order) Additional Comments: states he has a 3-wheeled walker, denies having grab bars in shower stall--previous PT notes state that therre are grab bars in bathroom/shower  Lives With: Spouse   Functional History: Prior Function Level of Independence: Independent Gait / Transfers Assistance Needed: ambulating with RW only in past 3 weeks; conmpletely independent before fall ADL's / Homemaking Assistance Needed: has been receiving assist for ADLs; [place] therapies and RN Comments: patient reporting falls nearly everyday due to back pain   Functional Status:   Mobility: Bed Mobility Overal bed mobility: Needs Assistance Bed Mobility: Supine to Sit Rolling: Min assist Sidelying to sit: Min assist Supine to sit: Supervision Sit to supine: Mod assist, +2 for physical assistance, HOB elevated General bed mobility comments: vc's for precautions. supervision for elevation of trunk to sitting. use of bed rails Transfers Overall transfer level: Needs assistance Equipment used: Ambulation equipment used, Rolling walker (2 wheeled) Transfer via Lift Equipment: Stedy Transfers: Sit to/from Stand Sit to Stand: +2 safety/equipment, Mod assist, Max assist, Min assist, +2 physical assistance Stand pivot transfers: Total assist Squat pivot transfers: Min assist  Lateral/Scoot Transfers: Mod assist General transfer comment: performed sit<>stand with Stedy x3, requiring increased assist with each attempt Mod A to Max A. Then was able to stand with mod A +2 from elevated recliner with RW and cues for hand placement.  Ambulation/Gait Ambulation/Gait assistance: Min Web designer (Feet): 20 Feet Assistive  device: Rolling walker (2 wheeled) Gait Pattern/deviations: Step-through pattern, Decreased stride length, Trunk flexed General Gait Details: Slow, unsteady gait with bil knee instability noted but no buckling. Cues for upright posture. Fatigues.  Gait velocity: decreased   ADL: ADL Overall ADL's : Needs assistance/impaired Eating/Feeding: Independent, Sitting Grooming: Set up, Sitting Grooming Details (indicate cue type and reason): in recliner Upper Body Bathing: Moderate assistance, Sitting Upper Body Bathing Details (indicate cue type and reason): for back Lower Body Bathing: Moderate assistance Lower Body Bathing Details (indicate cue type and reason): with Mod A sit<>stand Upper Body Dressing : Set up, Supervision/safety, Sitting Lower Body Dressing: Minimal assistance, With adaptive equipment, Cueing for safety, Cueing for sequencing,  Sitting/lateral leans Lower Body Dressing Details (indicate cue type and reason): LB dressing with AE provided in sitting Toilet Transfer: Moderate assistance, +2 for safety/equipment Toilet Transfer Details (indicate cue type and reason): use of Stedy Toileting- Clothing Manipulation and Hygiene: Moderate assistance, Sit to/from stand Toileting - Clothing Manipulation Details (indicate cue type and reason): Pt dependent on BUE for balance, OT performing rear peri care Functional mobility during ADLs: Moderate assistance, +2 for physical assistance, +2 for safety/equipment, Rolling walker General ADL Comments: Pt improving in activity tolerance and ability to sit<>stand, needs further education for compensatory strategies for maintaining back precautions with ADL   Cognition: Cognition Overall Cognitive Status: Impaired/Different from baseline Orientation Level: Oriented X4 Cognition Arousal/Alertness: Awake/alert Behavior During Therapy: WFL for tasks assessed/performed Overall Cognitive Status: Impaired/Different from baseline Area of Impairment: Safety/judgement Safety/Judgement: Decreased awareness of safety, Decreased awareness of deficits General Comments: Pt is keen to go home but admits frequent falls, and says "I'll just lay there until therapy comes"   Physical Exam: Blood pressure (!) 102/50, pulse 85, temperature 99 F (37.2 C), temperature source Oral, resp. rate 18, height 5\' 10"  (1.778 m), weight 98.4 kg, SpO2 95 %. Physical Exam  Constitutional: He appears well-developed. No distress.  HENT:  Head: Normocephalic and atraumatic.  Eyes: Pupils are equal, round, and reactive to light. EOM are normal.  Neck: Normal range of motion. No thyromegaly present.  Cardiovascular: Normal rate and regular rhythm.  No murmur heard. Respiratory: Effort normal. No respiratory distress. He has no wheezes.  GI: Soft. He exhibits no distension. There is no abdominal tenderness.   Musculoskeletal: Normal range of motion.        General: No edema.  Neurological:  Alert.  Follows commands.  Oriented to person place and time. UE 5/5. LE 3/5 HF, KE and 4/5 ADF/PF. Senses pain and LT in all 4's. DTR's trace to 1+.   Skin:  Back incision is dressed. Skin with diffuse erythema and severe flaking.   Psychiatric: He has a normal mood and affect. His behavior is normal. Judgment and thought content normal.      Lab Results Last 48 Hours  No results found for this or any previous visit (from the past 48 hour(s)).   Imaging Results (Last 48 hours)  No results found.           Medical Problem List and Plan: 1.  Decreased functional mobility secondary to HNP L2-3 with 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  Status post laminotomy L3-4 with microdissection nerve root decompression partial foraminotomies 01/02/2019.  No back brace needed.             -admit to inpatient rehab 2.  Antithrombotics: -DVT/anticoagulation: SCDs.  Check vascular study             -  antiplatelet therapy: N/A 3. Pain Management: Oxycodone and Ultram as needed, Zanaflex 4 mg 3 times daily as needed. Pain control appears adequate at present. 4. Mood: Ativan 0.5 mg twice daily             -antipsychotic agents: N/A 5. Neuropsych: This patient is capable of making decisions on his own behalf. 6. Skin/Wound Care: Routine skin checks 7. Fluids/Electrolytes/Nutrition: Routine in and outs with follow-up chemistries 8.  Hypertension.  Lisinopril 10 mg daily, Lopressor 25 mg twice daily.  Monitor with increased mobility 10.Alcohol use.Counseling 11.  Constipation.  Laxative assistance 12.  Psoriasis.  Kenalog cream twice daily  -moisturize skin   Post Admission Physician Evaluation: 1. Functional deficits secondary  to lumbar stenosis with radiculopathy. 2. Patient is admitted to receive collaborative, interdisciplinary care between the physiatrist, rehab nursing staff, and therapy team. 3.  Patient's level of medical complexity and substantial therapy needs in context of that medical necessity cannot be provided at a lesser intensity of care such as a SNF. 4. Patient has experienced substantial functional loss from his/her baseline which was documented above under the "Functional History" and "Functional Status" headings.  Judging by the patient's diagnosis, physical exam, and functional history, the patient has potential for functional progress which will result in measurable gains while on inpatient rehab.  These gains will be of substantial and practical use upon discharge  in facilitating mobility and self-care at the household level. 5. Physiatrist will provide 24 hour management of medical needs as well as oversight of the therapy plan/treatment and provide guidance as appropriate regarding the interaction of the two. 6. The Preadmission Screening has been reviewed and patient status is unchanged unless otherwise stated above. 7. 24 hour rehab nursing will assist with bladder management, bowel management, safety, skin/wound care, disease management, medication administration, pain management and patient education  and help integrate therapy concepts, techniques,education, etc. 8. PT will assess and treat for/with: Lower extremity strength, range of motion, stamina, balance, functional mobility, safety, adaptive techniques and equipment, NMR.   Goals are: min assist to supervision. 9. OT will assess and treat for/with: ADL's, functional mobility, safety, upper extremity strength, adaptive techniques and equipment .   Goals are: min assist to supervision. Therapy may proceed with showering this patient. 10. SLP will assess and treat for/with: n/a.  Goals are: n/a. 11. Case Management and Social Worker will assess and treat for psychological issues and discharge planning. 12. Team conference will be held weekly to assess progress toward goals and to determine barriers to discharge. 13.  Patient will receive at least 3 hours of therapy per day at least 5 days per week. 14. ELOS: 10-12 days       15. Prognosis:  excellent   I have personally performed a face to face diagnostic evaluation of this patient and formulated the key components of the plan.  Additionally, I have personally reviewed laboratory data, imaging studies, as well as relevant notes and concur with the physician assistant's documentation above.  Meredith Staggers, MD, FAAPMR         Lavon Paganini [place], PA-C 01/07/2019

## 2019-01-07 NOTE — Progress Notes (Signed)
Physical Therapy Treatment Patient Details Name: Paul Pineda MRN: [id number] DOB: 1946-01-28 Today's Date: 01/07/2019    History of Present Illness Patient is a 73 y/o male s/p Lumbar laminectomy microdiscectomy L2-3 L3-4 on 01/02/2019. Patient with a PMH significant of HTN, arthritis, LBBB. Pt with recent near fall upon eval 6/5 resulting in BLE weakness and decreased confidence.     PT Comments    Patient progressing slowly towards PT goals. Pt reports being very confused- does not know where he is and why he is in the hospital. Does not recall walking for the first time yesterday or being on a conference call with wife and CIR coordinator. Pt with poor awareness of deficits and safety. Tolerated gait training today with Min A for balance/safety and a close chair follow due to bil knee instability. No knee buckling today but weak throughout as well as unpredictable. Pt is not safe to return home. High fall risk. Requires 2 person heavy Max A to stand from EOB. Lengthy discussion with pt re: need for CIR, details of it etc. Pt agreeable to CIR by end of session. Will follow.   Follow Up Recommendations  CIR;Supervision/Assistance - 24 hour     Equipment Recommendations  None recommended by PT    Recommendations for Other Services       Precautions / Restrictions Precautions Precautions: Fall;Back Precaution Booklet Issued: No Precaution Comments: Reviewed precautions Restrictions Weight Bearing Restrictions: No    Mobility  Bed Mobility Overal bed mobility: Needs Assistance Bed Mobility: Supine to Sit     Supine to sit: Supervision     General bed mobility comments: vc's for precautions and log roll technique. supervision for elevation of trunk to sitting. use of bed rails. not following commands to do log roll technique.  Transfers Overall transfer level: Needs assistance Equipment used: Rolling walker (2 wheeled) Transfers: Sit to/from Stand Sit to Stand: Max  assist;+2 physical assistance         General transfer comment: Heavy Max A of 2 to stand from EOB with cues for technique/hand placement. Pt leaning posteriorly with increased hip/knee flexion. Transferred to chair post ambulation.  Ambulation/Gait Ambulation/Gait assistance: Min assist Gait Distance (Feet): 40 Feet Assistive device: Rolling walker (2 wheeled) Gait Pattern/deviations: Step-through pattern;Decreased stride length;Trunk flexed Gait velocity: decreased   General Gait Details: Slow, unsteady gait with bil knee instability noted but no buckling. Cues for upright posture. Fatigues.    Stairs             Wheelchair Mobility    Modified Rankin (Stroke Patients Only)       Balance Overall balance assessment: Needs assistance;History of Falls Sitting-balance support: Feet supported;No upper extremity supported Sitting balance-Leahy Scale: Fair     Standing balance support: Bilateral upper extremity supported;During functional activity Standing balance-Leahy Scale: Poor Standing balance comment: dependent on BUE in standing and RW.                             Cognition Arousal/Alertness: Awake/alert Behavior During Therapy: WFL for tasks assessed/performed Overall Cognitive Status: Impaired/Different from baseline Area of Impairment: Memory;Safety/judgement;Awareness;Orientation;Problem solving;Following commands                 Orientation Level: Disoriented to;Situation;Place   Memory: Decreased short-term memory;Decreased recall of precautions Following Commands: Follows multi-step commands inconsistently;Follows one step commands with increased time Safety/Judgement: Decreased awareness of safety;Decreased awareness of deficits Awareness: Intellectual Problem Solving: Slow processing;Requires verbal  cues General Comments: Reports feeling very confused yesterday and not able to recall walking for the first time or the 3 way call with  wife and CIR coordinator. "I am in some med center" Pt not sure why he is in the hospital, "I had to come back and get something." No awareness of safety/deficits. Requires 2 people to stand and pt reports his neighbors can come over and help him walk.      Exercises      General Comments General comments (skin integrity, edema, etc.): Itching throughout back and extremities. Hx of psoriasis.      Pertinent Vitals/Pain Pain Assessment: Faces Faces Pain Scale: Hurts little more Pain Location: lower back at incision site Pain Descriptors / Indicators: Aching;Discomfort;Guarding Pain Intervention(s): Monitored during session;Repositioned    Home Living                      Prior Function            PT Goals (current goals can now be found in the care plan section) Progress towards PT goals: Progressing toward goals    Frequency    Min 5X/week      PT Plan Current plan remains appropriate    Co-evaluation              AM-PAC PT "6 Clicks" Mobility   Outcome Measure  Help needed turning from your back to your side while in a flat bed without using bedrails?: A Little Help needed moving from lying on your back to sitting on the side of a flat bed without using bedrails?: A Little Help needed moving to and from a bed to a chair (including a wheelchair)?: Total Help needed standing up from a chair using your arms (e.g., wheelchair or bedside chair)?: Total Help needed to walk in hospital room?: A Lot Help needed climbing 3-5 steps with a railing? : Total 6 Click Score: 11    End of Session Equipment Utilized During Treatment: Gait belt Activity Tolerance: Patient tolerated treatment well Patient left: with call bell/phone within reach;in chair Nurse Communication: Mobility status PT Visit Diagnosis: Unsteadiness on feet (R26.81);Other abnormalities of gait and mobility (R26.89);Muscle weakness (generalized) (M62.81);History of falling (Z91.81);Pain Pain -  part of body: (back)     Time: 5188-4166 PT Time Calculation (min) (ACUTE ONLY): 26 min  Charges:  $Gait Training: 8-22 mins $Therapeutic Activity: 8-22 mins                     Wray Kearns, [place], DPT Acute Rehabilitation Services Pager [phone] Office [place] 01/07/2019, 11:47 AM

## 2019-01-07 NOTE — Progress Notes (Signed)
Pt admitted to room 4W25. Oriented to floor, call bell, and no fall policy. Fatigued, but pleasant mood. Vital signs stable at time of admission.   Gerald Stabs, RN

## 2019-01-07 NOTE — Progress Notes (Signed)
Subjective: Patient reports doing very well, waiting for rehab placement  Objective: Vital signs in last 24 hours: Temp:  [97.7 F (36.5 C)-99.1 F (37.3 C)] 99 F (37.2 C) (06/09 0404) Pulse Rate:  [71-91] 85 (06/09 0404) Resp:  [14-18] 18 (06/09 0404) BP: (102-157)/(50-76) 102/50 (06/09 0404) SpO2:  [94 %-100 %] 95 % (06/09 0404)  Intake/Output from previous day: 06/08 0701 - 06/09 0700 In: -  Out: 600 [Urine:600] Intake/Output this shift: No intake/output data recorded.  Neurologic: Grossly normal  Lab Results: Lab Results  Component Value Date   WBC 11.8 (H) 12/29/2018   HGB 13.6 12/29/2018   HCT 41.4 12/29/2018   MCV 105.6 (H) 12/29/2018   PLT 189 12/29/2018   Lab Results  Component Value Date   INR 0.96 07/04/2013   BMET Lab Results  Component Value Date   NA 141 12/29/2018   K 3.7 12/29/2018   CL 108 12/29/2018   CO2 24 12/29/2018   GLUCOSE 110 (H) 12/29/2018   BUN 12 12/29/2018   CREATININE 1.11 12/29/2018   CALCIUM 8.6 (L) 12/29/2018    Studies/Results: No results found.  Assessment/Plan: Postop day 5. Doing well, improving with therapies but still needs CIR. Awaiting placement.    LOS: 2 days    Paul Pineda 01/07/2019, 8:07 AM

## 2019-01-07 NOTE — Progress Notes (Signed)
No acute events overnight. Placed new IV. VSS. No issues with pain overnight. Plan for transfer to inpatient rehab today.

## 2019-01-07 NOTE — Progress Notes (Signed)
Inpatient Rehabilitation Admissions Coordinator  I met with patient at bedside to review cost of care for CIR and that insurance has approved his admission. Offered admission today. Patient asked to call his wife and speak privately on his choice. I then contacted wife with his permission and spoke with her on speaker phone to review goals , expectations and cost of care for CIR. Patient refuses admission to CIR . Wants home with [facility]. Wife upset with this decision. I contacted Margo Aye, NP. We will sign off at this time as patient refuses admission.  Danne Baxter, RN, MSN Rehab Admissions Coordinator [phone] 01/07/2019 9:01 AM

## 2019-01-08 ENCOUNTER — Inpatient Hospital Stay (HOSPITAL_COMMUNITY): Payer: Medicare Other | Admitting: Physical Therapy

## 2019-01-08 ENCOUNTER — Inpatient Hospital Stay (HOSPITAL_COMMUNITY): Payer: Medicare Other

## 2019-01-08 ENCOUNTER — Inpatient Hospital Stay (HOSPITAL_COMMUNITY): Payer: Medicare Other | Admitting: Occupational Therapy

## 2019-01-08 DIAGNOSIS — M10071 Idiopathic gout, right ankle and foot: Secondary | ICD-10-CM

## 2019-01-08 DIAGNOSIS — D62 Acute posthemorrhagic anemia: Secondary | ICD-10-CM

## 2019-01-08 DIAGNOSIS — L409 Psoriasis, unspecified: Secondary | ICD-10-CM

## 2019-01-08 LAB — COMPREHENSIVE METABOLIC PANEL
ALT: 19 U/L (ref 0–44)
AST: 27 U/L (ref 15–41)
Albumin: 2.1 g/dL — ABNORMAL LOW (ref 3.5–5.0)
Alkaline Phosphatase: 74 U/L (ref 38–126)
Anion gap: 8 (ref 5–15)
BUN: 13 mg/dL (ref 8–23)
CO2: 28 mmol/L (ref 22–32)
Calcium: 8.3 mg/dL — ABNORMAL LOW (ref 8.9–10.3)
Chloride: 104 mmol/L (ref 98–111)
Creatinine, Ser: 1 mg/dL (ref 0.61–1.24)
GFR calc Af Amer: >60 mL/min (ref >60)
GFR calc non Af Amer: >60 mL/min (ref >60)
Glucose, Bld: 97 mg/dL (ref 70–99)
Potassium: 3.7 mmol/L (ref 3.5–5.1)
Sodium: 140 mmol/L (ref 135–145)
Total Bilirubin: 1 mg/dL (ref 0.3–1.2)
Total Protein: 5.6 g/dL — ABNORMAL LOW (ref 6.5–8.1)

## 2019-01-08 LAB — CBC WITH DIFFERENTIAL/PLATELET
Abs Immature Granulocytes: 0.17 10*3/uL — ABNORMAL HIGH (ref 0.00–0.07)
Basophils Absolute: 0.1 10*3/uL (ref 0.0–0.1)
Basophils Relative: 1 %
Eosinophils Absolute: 0.8 10*3/uL — ABNORMAL HIGH (ref 0.0–0.5)
Eosinophils Relative: 9 %
HCT: 33.3 % — ABNORMAL LOW (ref 39.0–52.0)
Hemoglobin: 11.1 g/dL — ABNORMAL LOW (ref 13.0–17.0)
Immature Granulocytes: 2 %
Lymphocytes Relative: 12 %
Lymphs Abs: 1 10*3/uL (ref 0.7–4.0)
MCH: 33.9 pg (ref 26.0–34.0)
MCHC: 33.3 g/dL (ref 30.0–36.0)
MCV: 101.8 fL — ABNORMAL HIGH (ref 80.0–100.0)
Monocytes Absolute: 1.4 10*3/uL — ABNORMAL HIGH (ref 0.1–1.0)
Monocytes Relative: 16 %
Neutro Abs: 5.5 10*3/uL (ref 1.7–7.7)
Neutrophils Relative %: 60 %
Platelets: 253 10*3/uL (ref 150–400)
RBC: 3.27 MIL/uL — ABNORMAL LOW (ref 4.22–5.81)
RDW: 12.6 % (ref 11.5–15.5)
WBC: 8.9 10*3/uL (ref 4.0–10.5)
nRBC: 0 % (ref 0.0–0.2)

## 2019-01-08 MED ORDER — PREDNISONE 20 MG PO TABS
20.0000 mg | ORAL_TABLET | Freq: Two times a day (BID) | ORAL | Status: DC
  Administered 2019-01-08 – 2019-01-09 (×3): 20 mg via ORAL
  Filled 2019-01-08 (×3): qty 1

## 2019-01-08 NOTE — Progress Notes (Signed)
Inpatient Rehabilitation  Patient information reviewed and entered into eRehab system by Arcadio Cope M. Laiylah Roettger, M.A., CCC/SLP, PPS Coordinator.  Information including medical coding, functional ability and quality indicators will be reviewed and updated through discharge.    

## 2019-01-08 NOTE — Evaluation (Addendum)
Physical Therapy Assessment and Plan  Patient Details  Name: Paul Pineda MRN: [id number] Date of Birth: 12/29/1945  PT Diagnosis: Abnormal posture, Abnormality of gait, Cognitive deficits, Edema, Low back pain, Muscle weakness and Pain in low back Rehab Potential: Good ELOS: 14 - 18  Today's Date: 01/08/2019 PT Individual Time: 8841-6606 PT Individual Time Calculation (min): 75 min    Problem List:  Patient Active Problem List   Diagnosis Date Noted  . Lumbar radiculopathy 01/07/2019  . Lumbar herniated disc 01/06/2019  . HNP (herniated nucleus pulposus), lumbar 01/02/2019  . Aortic atherosclerosis ([place]) 10/09/2018  . 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 Sensorineural hearing loss (SNHL), bilateral 11/22/2017  . Tinnitus, bilateral 11/22/2017  . Syncope and collapse 08/15/2013  . Tachycardia 08/15/2013  . Dehydration 08/15/2013  . Postoperative anemia due to acute blood loss 07/15/2013  . OA (osteoarthritis) of knee 07/14/2013    Past Medical History:  Past Medical History:  Diagnosis Date  . Arthritis   . Cancer (HCC)    chest lump ,scrotum  . Cataract    right  . Heart murmur   . HOH (hard of hearing)   . Hyperlipidemia   . Hypertension   . Left bundle branch block   . Pneumonia    hx  . Psoriasis    Past Surgical History:  Past Surgical History:  Procedure Laterality Date  . CARDIAC CATHETERIZATION  10/11/2009   no sign CAD, EF 45-50%  . COLONOSCOPY    . INGUINAL HERNIA REPAIR Right 04/29/2013   Procedure: HERNIA REPAIR INGUINAL ADULT;  Surgeon: Ralene Ok, MD;  Location: [facility];  Service: General;  Laterality: Right;  . INSERTION OF MESH Right 04/29/2013   Procedure: INSERTION OF MESH;  Surgeon: Ralene Ok, MD;  Location: [facility];  Service: General;  Laterality: Right;  . LUMBAR LAMINECTOMY/DECOMPRESSION MICRODISCECTOMY Left 01/02/2019   Procedure: Microdiscectomy - L2-L3 - DLL L3-L4 - left LUMBAR TWO-THREE MICRODISCETOMY WITH LEFT LUMBAR THREE-FOUR  DECOMPRESSIVE LUMBAR DECOMPRESSIVE LAMINECTOMY;  Surgeon: Kary Kos, MD;  Location: [facility];  Service: Neurosurgery;  Laterality: Left;  . lumps     scrotum, chest   . TOTAL KNEE ARTHROPLASTY Right 07/14/2013   Procedure: RIGHT TOTAL KNEE ARTHROPLASTY;  Surgeon: Gearlean Alf, MD;  Location: [facility];  Service: Orthopedics;  Laterality: Right;    Assessment & Plan Clinical Impression:  Patient transferred to CIR on 01/07/2019 .   Patient currently requires max with mobility secondary to muscle weakness and muscle joint tightness, decreased awareness, decreased problem solving, decreased safety awareness and decreased memory and decreased sitting balance, decreased standing balance, decreased postural control, decreased balance strategies and difficulty maintaining precautions.  Prior to hospitalization, patient was receiving asistance from [facility] aides and therapies; pt is a poor historian.  He lived with Spouse in a House home.  Home access is 3Stairs to enter, no rails.  Per chart, pt had a fall 3 weeks ago with resulting weakness; by admission, he was falling daily.  Patient will benefit from skilled PT intervention to maximize safe functional mobility, minimize fall risk and decrease caregiver burden for planned discharge home with 24 hour supervision.  Anticipate patient will benefit from follow up [place] at discharge.  PT - End of Session Activity Tolerance: Tolerates 10 - 20 min activity with multiple rests Endurance Deficit: Yes Endurance Deficit Description: due to pain PT Assessment Rehab Potential (ACUTE/IP ONLY): Good PT Patient demonstrates impairments in the following area(s): Balance;Motor;Edema;Pain;Endurance PT Transfers Functional Problem(s): Bed to Chair;Car;Bed  Mobility PT Locomotion Functional Problem(s): Ambulation;Stairs;Wheelchair Mobility PT Plan PT Intensity: Minimum of 1-2 x/day ,45 to 90 minutes PT Frequency: 5 out of 7 days PT Duration Estimated Length of Stay: 14 PT  Treatment/Interventions: Ambulation/gait training;Balance/vestibular training;Cognitive remediation/compensation;Community reintegration;Discharge planning;DME/adaptive equipment instruction;Functional electrical stimulation;Functional mobility training;Neuromuscular re-education;Pain management;Patient/family education;Psychosocial support;Splinting/orthotics;Stair training;Therapeutic Activities;Therapeutic Exercise;UE/LE Strength taining/ROM;UE/LE Coordination activities;Wheelchair propulsion/positioning PT Transfers Anticipated Outcome(s): S basic and car PT Locomotion Anticipated Outcome(s): S gait x 150' wiht LRAD and up/down 4 steps bil rails; min assist up/down 3 STE home without rails, with LRAD; mod I for w/c propulsion x 150' controlled setting and x 50' home setting PT Recommendation Recommendations for Other Services: Neuropsych consult(PRN for higher level cognition) Follow Up Recommendations: Home health PT Patient destination: Home Equipment Recommended: To be determined Equipment Details: owns a RW and a 3 wheeled walker, transport chair  Skilled Therapeutic Intervention  See evaluation results below.  See comment about ELOS.  Pt closed his eyes frequently during session, but did not appear particularly fatigued.    Pt resting in bed.  Pt is very HOH and does not reply to some questions/comments.  He stated that he has hearing aides but did not bring them to the hospital.  PT strongly urged him to have family bring them.  Blood noted on sheets under L UE; PT notified Santiago Glad, RN who stated to keep 2 skin tears , which appeared to be clotted closed, open to air.  Pt has very fragile skin due to psoriasis.  Attempted sit> stand from raised bed.  Pt unable due to weakness and difficulty transferring wt forward.  Pt has poor awareness of the extent of his problems, and used humor to try to gloss over memory problems regarding back precautions, as well as problem-solving regarding moving  safely with weak bil LEs.  Pt performed Therapeutic exercise performed with LE to increase strength for functional mobility: 10 x 1 each : seated bil glut sets, R/L long arc quad knee extension, bil ankle pumps.  Use of Stedy for sit> stand with max assist, max cues for technique,  x 4, with improving wt shift given max cues for technique.  PT switched pt into 18 x 18" w/c with supportive cushion.  Pt able to point to the button he would push if he needed help or to use the urinal.  At end of session, pt was seated in w/c with needs at hand and seat belt alarm set.   PT Evaluation Precautions/Restrictions Precautions Precautions: Fall;Back Precaution Booklet Issued: No Precaution Comments: Reviewed precautions; no brace Restrictions Weight Bearing Restrictions: No General   Vital Signs  Pain Pain Assessment Pain Scale: 0-10 Pain Score: 4  Pain Type: Surgical pain;Acute pain Pain Location: Back Pain Orientation: Lower Pain Descriptors / Indicators: Throbbing Pain Frequency: Constant Pain Onset: On-going Pain Intervention(s): Medication (See eMAR) Home Living/Prior Functioning Home Living Available Help at Discharge: Friend(s);Available 24 hours/day Type of Home: House Home Access: Stairs to enter [employer] of Steps: 3 Entrance Stairs-Rails: None Home Layout: One level Bathroom Shower/Tub: Walk-in shower;Tub/shower unit Bathroom Toilet: Standard Additional Comments: states he has a 3-wheeled walker  Lives With: Spouse Prior Function Level of Independence: difficult to obtain from pt; Needs assistance with ADLs(home health aides, also HHPT)  Able to Take Stairs?: Yes(felt unsteady and avoided them; not leaving house recenlty) Driving: No Vocation: Part time employment Leisure: Hobbies-yes (Comment)(bird watching and reading) Comments: patient reporting falls nearly everyday due to back pain  Wife works , but has been  off work x 3 weeks.  Vision/Perception -  wears reading glasses; no change from baseline, per pt    Cognition Overall Cognitive Status: Impaired/Different from baseline Arousal/Alertness: Lethargic Orientation Level: Oriented to place;Oriented to situation;Oriented to person Memory: Impaired; pt able to remember 2/3 back precautions throughout session, despite PT teaching acronym, demonstrating, and discussing.   Memory Impairment: Decreased recall of new information Awareness: Impaired Safety/Judgment: impaired Sensation Sensation Light Touch: Impaired Detail(R great toe "feels numb") Proprioception: Appears Intact Coordination Gross Motor Movements are Fluid and Coordinated: No Fine Motor Movements are Fluid and Coordinated: No Heel Shin Test: limited excursion LLE Motor  Motor Motor - Skilled Clinical Observations: generalized weakness  Mobility Bed Mobility Bed Mobility: Rolling Right;Rolling Left;Left Sidelying to Sit Rolling Right: Supervision/verbal cueing Rolling Left: Supervision/Verbal cueing Left Sidelying to Sit: Minimal Assistance - Patient >75% Transfers Transfers: Pharmacist, hospital Pivot Transfers: Maximal Assistance - Patient 25-49% Transfer (Assistive device): None Locomotion  Gait Ambulation: No (+2 not available) Gait Gait: No Stairs / Additional Locomotion Stairs: Yes Wheelchair Mobility Wheelchair Mobility: Yes Wheelchair Assistance: Chartered loss adjuster: Both upper extremities Wheelchair Parts Management: Needs assistance Distance: 150  Trunk/Postural Assessment  Cervical Assessment Cervical Assessment: Within Functional Limits Thoracic Assessment Thoracic Assessment: Within Functional Limits(forward shoulders) Lumbar Assessment Lumbar Assessment: Exceptions to WFL(lordosis absent; sits in posterior pelvic tilt) Postural Control Postural Control: Deficits on evaluation Postural Limitations: lumbar surgery  Balance Balance Balance Assessed:  Yes Static Sitting Balance Static Sitting - Level of Assistance: 6: Modified independent (Device/Increase time) Dynamic Sitting Balance Dynamic Sitting - Level of Assistance: 5: Stand by assistance Sitting balance - Comments: crossed leg to adjust sock, in sitting EOB with feet supported Static Standing Balance Static Standing - Level of Assistance: 5: Stand by assistance(standing in [place]) Dynamic Standing Balance Dynamic Standing - Level of Assistance: Not tested (comment) Extremity Assessment      RLE Assessment RLE Assessment: Exceptions to [facility] Passive Range of Motion (PROM) Comments: tight hamstrings and heel cords General Strength Comments: grossly in sitting: at least 3+/5 hip flexion (little resistance given), 4/5 knee extension and 5/5 ankle DF Edema R forefoot, with erythema L great toe; MD suspects gout LLE Assessment LLE Assessment: Exceptions to [facility] Passive Range of Motion (PROM) Comments: tight hamstrings and heel cords General Strength Comments: grossly in sitting: 2-/5 hip flexion, 4-/5 knee extension,  4/5 ankle DF    Refer to Care Plan for Long Term Goals  Recommendations for other services: Neuropsych PRN for cognitive deficits; odd affect  Discharge Criteria: Patient will be discharged from PT if patient refuses treatment 3 consecutive times without medical reason, if treatment goals not met, if there is a change in medical status, if patient makes no progress towards goals or if patient is discharged from hospital.  The above assessment, treatment plan, treatment alternatives and goals were discussed and mutually agreed upon: by patient.    Pt reported " I don't think I need to stay that long".  PT informed him that date is estimated and that it can be moved if he progresses faster than expected.  Dixie Jafri 01/08/2019, 11:00 AM

## 2019-01-08 NOTE — Progress Notes (Signed)
Pt's wife, Arbie Cookey, called regarding pt texting her that he is going to leave Rehab. Wife spoke at length about the pt's ETOH abuse and his inability to ambulate and general weakness. Wife voiced concern about her ability to care for him at this time should he decide to leave rehab. Wife was tearful and stated that she was told that she would speak to SW and MD prior to transfer to Rehab and had not received any calls.  Writer spent one hour informing Arbie Cookey of the safety, P/P's,  and standard of care that is performed in Rehab. Function and goals of Rehab reviewed with wife. Writer told Arbie Cookey that should pt want to leave at any time throughout this night that this writer would spend as much time as needed with the patient discussing care and benefits of rehab and the risks associated with leaving before goals were met. Wife had questions regarding what would happen if pt signed himself out of rehab and what repercussions would be incurred.  Arbie Cookey was told by Probation officer that it would be his SW who could properly answer those questions. Writer assured Arbie Cookey that every effort would be made to educate patient and that any reasonable requests that did not impede with pt safety would be taken under consideration. Pt's wife was given reassurance and informed that at that time (01/07/19 2000) that the pt had not expressed any desire to leave rehab nor did he offer any complaints.  Pt is currently resting and has been calm and cooperative with all staff members.

## 2019-01-08 NOTE — Progress Notes (Signed)
Pt confused and unable to recall the events of the day.  Wife on phone. Talked to wife and wanted to know what pt had done today that he had called and been very confused. Agreed that did seem more confused that he had been in the am. Informed that would try a different pain medication to see if makes a difference.  Also informed that may still have some residual effects from anesthesia. Her concern was that he has been an alcoholic for approx 30 years. Informed her that it has been noted then she informed that he was dehydrated. Informed her that would address concerns with MD in am. Wife also concerned about itching.  Informed that it is being addressed and tx and was started on Prednisone this am.  She seemed relieved to find that was the case and would call again tomorrow for outcome.

## 2019-01-08 NOTE — Evaluation (Signed)
Occupational Therapy Assessment and Plan  Patient Details  Name: Paul Pineda MRN: [id number] Date of Birth: 1946/03/20  OT Diagnosis: acute pain, cognitive deficits, muscular wasting and disuse atrophy and muscle weakness (generalized), abnormal posture Rehab Potential: Rehab Potential (ACUTE ONLY): Good ELOS: 14-18 days   Today's Date: 01/08/2019 OT Individual Time: 1100-1200 OT Individual Time Calculation (min): 60 min     Problem List:  Patient Active Problem List   Diagnosis Date Noted  . Lumbar radiculopathy 01/07/2019  . Lumbar herniated disc 01/06/2019  . HNP (herniated nucleus pulposus), lumbar 01/02/2019  . Aortic atherosclerosis ([place]) 10/09/2018  . 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) 10/01/2018  . Sensorineural hearing loss (SNHL), bilateral 11/22/2017  . Tinnitus, bilateral 11/22/2017  . Syncope and collapse 08/15/2013  . Tachycardia 08/15/2013  . Dehydration 08/15/2013  . Postoperative anemia due to acute blood loss 07/15/2013  . OA (osteoarthritis) of knee 07/14/2013    Past Medical History:  Past Medical History:  Diagnosis Date  . Arthritis   . Cancer (HCC)    chest lump ,scrotum  . Cataract    right  . Heart murmur   . HOH (hard of hearing)   . Hyperlipidemia   . Hypertension   . Left bundle branch block   . Pneumonia    hx  . Psoriasis    Past Surgical History:  Past Surgical History:  Procedure Laterality Date  . CARDIAC CATHETERIZATION  10/11/2009   no sign CAD, EF 45-50%  . COLONOSCOPY    . INGUINAL HERNIA REPAIR Right 04/29/2013   Procedure: HERNIA REPAIR INGUINAL ADULT;  Surgeon: Ralene Ok, MD;  Location: [place];  Service: General;  Laterality: Right;  . INSERTION OF MESH Right 04/29/2013   Procedure: INSERTION OF MESH;  Surgeon: Ralene Ok, MD;  Location: [place];  Service: General;  Laterality: Right;  . LUMBAR LAMINECTOMY/DECOMPRESSION MICRODISCECTOMY Left 01/02/2019   Procedure: Microdiscectomy - L2-L3 - DLL L3-L4 - left LUMBAR TWO-THREE  MICRODISCETOMY WITH LEFT LUMBAR THREE-FOUR DECOMPRESSIVE LUMBAR DECOMPRESSIVE LAMINECTOMY;  Surgeon: Kary Kos, MD;  Location: [place];  Service: Neurosurgery;  Laterality: Left;  . lumps     scrotum, chest   . TOTAL KNEE ARTHROPLASTY Right 07/14/2013   Procedure: RIGHT TOTAL KNEE ARTHROPLASTY;  Surgeon: Gearlean Alf, MD;  Location: [facility];  Service: Orthopedics;  Laterality: Right;    Assessment & Plan Clinical Impression:  Paul Pineda is a 73 year old right-handed male history of hypertension and alcohol use as reported by wife.  Per chart review patient lives with spouse and had been receiving home health therapies for decreased mobility related to back pain.  Presented 01/02/2019 with low back pain radiating to left lower extremity x2 weeks.  Denied bowel or bladder disturbances.  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  Underwent sublaminar R decompressive laminectomy L2-3 with left side microdiscectomy of L3 nerve root with partial medial facetectomy and foraminotomy 01/02/2019 per Dr. Saintclair Halsted.  No brace needed.  Hospital course pain management.  Therapy evaluations completed patient was admitted for a comprehensive rehab program.   Patient transferred to CIR on 01/07/2019 .    Patient currently requires max to total with lower body basic self-care skills secondary to muscle weakness and muscle joint tightness, decreased cardiorespiratoy endurance, decreased awareness, decreased problem solving, decreased safety awareness, decreased memory and delayed processing and decreased standing balance, decreased postural control and decreased balance strategies.  Prior to hospitalization, patient could complete BADLs with mod.  Patient will  benefit from skilled intervention to increase independence with basic self-care skills prior to discharge home with care partner.  Anticipate patient will require minimal physical assistance and follow up home  health.  OT - End of Session Endurance Deficit: Yes Endurance Deficit Description: due to pain OT Assessment Rehab Potential (ACUTE ONLY): Good OT Patient demonstrates impairments in the following area(s): Balance;Cognition;Endurance;Motor;Sensory;Safety OT Basic ADL's Functional Problem(s): Bathing;Dressing;Toileting OT Transfers Functional Problem(s): Toilet;Tub/Shower OT Additional Impairment(s): None OT Plan OT Intensity: Minimum of 1-2 x/day, 45 to 90 minutes OT Frequency: 5 out of 7 days OT Duration/Estimated Length of Stay: 14-18 days OT Treatment/Interventions: Balance/vestibular training;Cognitive remediation/compensation;Discharge planning;DME/adaptive equipment instruction;Disease mangement/prevention;Functional mobility training;Neuromuscular re-education;Patient/family education;Pain management;Psychosocial support;Self Care/advanced ADL retraining;Therapeutic Activities;Therapeutic Exercise;UE/LE Strength taining/ROM;UE/LE Coordination activities OT Self Feeding Anticipated Outcome(s): no goal, pt is I OT Basic Self-Care Anticipated Outcome(s): min A with LB dressing, bathing and toileting OT Toileting Anticipated Outcome(s): Min A OT Bathroom Transfers Anticipated Outcome(s): MIn A to tub bench, S to toilet OT Recommendation Recommendations for Other Services: Speech consult;Neuropsych consult Patient destination: Home Follow Up Recommendations: Home health OT Equipment Recommended: Tub/shower bench;To be determined;3 in 1 bedside comode   Skilled Therapeutic Intervention Pt seen for initial evaluation and ADL training with a focus on back precautions.  Pt is very hard of hearing so it was difficult to determine if pt's confusion and decreased memory was due to cognitive impairment or limited hearing.  A speech therapy eval will be helpful. Pt received in w/c and completed UB self care with set up at sink.  Attempted sit to stand with stedy from wc but pt unable to push up  with legs. Needed +2 A, then pt could stand with mod A.  Once upright he could hold his standing balance with min A using stedy bar for support.  From pads of stedy to stand, pt can lift up with min a.    He needs total to max A with LB bathing and dressing.  Discussed role of OT, pt's goals, ELOS, and reviewed back precautions 3x. Pt was unable to recall them by himself, needed mod cues.  Pt resting in w/c with belt alarm on and all needs met.  OT Evaluation Precautions/Restrictions  Precautions Precautions: Fall;Back Precaution Comments: Reviewed precautions; no brace Restrictions Weight Bearing Restrictions: No     Pain Pain Assessment Pain Scale: 0-10 Pain Score: 7  Pain Type: Surgical pain Pain Location: Back Pain Orientation: Mid;Lower Pain Descriptors / Indicators: Aching;Throbbing Pain Frequency: Constant Pain Onset: On-going Pain Intervention(s): Medication (See eMAR);Repositioned;Distraction Home Living/Prior Functioning Home Living Family/patient expects to be discharged to:: Private residence Living Arrangements: Spouse/significant other, Children Available Help at Discharge: Friend(s), Available 24 hours/day Type of Home: House Home Access: Stairs to enter Technical brewer of Steps: 3 Entrance Stairs-Rails: None Home Layout: One level Bathroom Shower/Tub: Gaffer, Chiropodist: Standard Additional Comments: states he has a Copy  Lives With: Spouse Prior Function Level of Independence: Needs assistance with ADLs(home health aides, HHPT and OT)  Able to Take Stairs?: Yes(felt unsteady and avoided them; not leaving house recenlty) Driving: No Vocation: Part time employment Leisure: Hobbies-yes (Comment)(bird watching and reading) Comments: patient reporting falls nearly everyday due to back pain; pt reported that he was still driving and did most of the cooking (Pt is a poor historian and most likely was not doing  these tasks) ADL  max - total A LB self care and toileting and transfers Vision Baseline Vision/History: Wears glasses Wears Glasses:  Reading only Patient Visual Report: No change from baseline Vision Assessment?: No apparent visual deficits Perception  Perception: Within Functional Limits Praxis Praxis: Intact Cognition Overall Cognitive Status: Impaired/Different from baseline Arousal/Alertness: Lethargic Orientation Level: Person Year: 2022 Month: June Day of Week: Incorrect(Monday) Memory: Impaired Memory Impairment: Decreased recall of new information Immediate Memory Recall: Sock;Blue;Bed Memory Recall: Sock;Blue;Bed Memory Recall Sock: With Cue Memory Recall Blue: With Cue Memory Recall Bed: (unable to recall with cues) Awareness: Impaired Problem Solving: Impaired Problem Solving Impairment: Verbal basic;Functional basic Safety/Judgment: Appears intact Sensation Sensation Light Touch: Impaired Detail Peripheral sensation comments: Pt reports numbness in finger tips and R big toe Hot/Cold: Appears Intact Proprioception: Appears Intact Stereognosis: Not tested Coordination Gross Motor Movements are Fluid and Coordinated: No Fine Motor Movements are Fluid and Coordinated: No Heel Shin Test: limited excursion LLE Motor  Motor Motor - Skilled Clinical Observations: generalized weakness Mobility  Bed Mobility Bed Mobility: Rolling Right;Rolling Left;Left Sidelying to Sit Rolling Right: Supervision/verbal cueing Rolling Left: Supervision/Verbal cueing Left Sidelying to Sit: Minimal Assistance - Patient >75%  Trunk/Postural Assessment  Cervical Assessment Cervical Assessment: Within Functional Limits Thoracic Assessment Thoracic Assessment: Within Functional Limits(forward shoulders) Lumbar Assessment Lumbar Assessment: Exceptions to WFL(lordosis absent; sits in posterior pelvic tilt) Postural Control Postural Control: Deficits on evaluation Postural  Limitations: lumbar surgery  Balance Balance Balance Assessed: Yes Static Sitting Balance Static Sitting - Level of Assistance: 6: Modified independent (Device/Increase time) Dynamic Sitting Balance Dynamic Sitting - Level of Assistance: 5: Stand by assistance Sitting balance - Comments: crossed leg to adjust sock, in sitting EOB with feet supported Static Standing Balance Static Standing - Level of Assistance: 5: Stand by assistance(standing in [place]) Dynamic Standing Balance Dynamic Standing - Level of Assistance: Not tested (comment) Extremity/Trunk Assessment RUE Assessment RUE Assessment: Within Functional Limits LUE Assessment LUE Assessment: Within Functional Limits     Refer to Care Plan for Long Term Goals  Recommendations for other services: Neuropsych and Other: speech therapy   Discharge Criteria: Patient will be discharged from OT if patient refuses treatment 3 consecutive times without medical reason, if treatment goals not met, if there is a change in medical status, if patient makes no progress towards goals or if patient is discharged from hospital.  The above assessment, treatment plan, treatment alternatives and goals were discussed and mutually agreed upon: by patient  [facility] 01/08/2019, 12:43 PM

## 2019-01-08 NOTE — Progress Notes (Signed)
Initial Nutrition Assessment  RD working remotely.  DOCUMENTATION CODES:   Not applicable  INTERVENTION:   -Snacks TID -MVI with minerals daily  NUTRITION DIAGNOSIS:   Increased nutrient needs related to post-op healing as evidenced by estimated needs.  GOAL:   Patient will meet greater than or equal to 90% of their needs  MONITOR:   PO intake, Supplement acceptance, Labs, Weight trends, Skin, I & O's  REASON FOR ASSESSMENT:   Malnutrition Screening Tool    ASSESSMENT:   Paul Pineda is a 73 year old right-handed male history of hypertension and alcohol use as reported by wife.  Per chart review patient lives with spouse and had been receiving home health therapies for decreased mobility related to back pain.  Presented 01/02/2019 with low back pain radiating to left lower extremity x2 weeks.  Denied bowel or bladder disturbances.  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  Underwent sublaminar R decompressive laminectomy L2-3 with left side microdiscectomy of L3 nerve root with partial medial facetectomy and foraminotomy 01/02/2019 per Dr. Saintclair Halsted.  No brace needed.  Hospital course pain management.  Therapy evaluations completed patient was admitted for a comprehensive rehab program.  Pt admitted to CIR with decreased functional mobilitysecondary to HNP L2-3 with 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 Status post laminotomy L3-4 with microdissection nerve root decompression partial foraminotomies 01/02/2019.  Reviewed I/O's: -480 ml x 24 hours  UOP: 600 ml x 24 hours  Spoke with pt on phone (prefers to be addressed as Child psychotherapist"), who was pleasant and in good spirits today, at one point joking with this RD about obtaining a small loan for horse races. He reports his appetite has been variable lately ("some days I'm ravenous and eat well, and other times I don't feel like eating much at all"). Pt shares PTA he  generally consumes 2.5 meals per day, which consist of a meat and vegetable (pt was cooking his own meals PTA). He also enjoys snacking during the day on items such as cheese and crackers, fruit juice, and V8 juice. Since being hospitalized, he feels like his appetite has returned (consumed 100% of french toast and bacon breakfast this morning). Noted documented meal completion 70%. Per pt, he has been getting hungry between meals due to therapy treatments.   Pt shares his UBW is around 220# "plus or minus 5#". He reports last weighing 217# PTA. He endorses a few pounds of weight loss, but denies any noticeable weight loss or being concerned about weight loss. Per weight history records, pt has experienced a 4.3% wt loss over the past 3 months, which is not significant for time frame.   Discussed with pt importance of good meal intake to assist with rehab progression and post-operative healing. RD offered oral nutrition supplements, however, pt politely declined. He requested between meals nourishments to help satisfy between meal hunger and to mimic home meal pattern- RD obtained preferences and ordered snacks into meal ordering system.   Medications reviewed and include prednisone.   Labs reviewed.   NUTRITION - FOCUSED PHYSICAL EXAM:    Most Recent Value  Orbital Region  Unable to assess  Upper Arm Region  Unable to assess  Thoracic and Lumbar Region  Unable to assess  Buccal Region  Unable to assess  Temple Region  Unable to assess  Clavicle Bone Region  Unable to assess  Clavicle and Acromion Bone Region  Unable to assess  Scapular Bone Region  Unable to assess  Dorsal Hand  Unable to assess  Patellar Region  Unable to assess  Anterior Thigh Region  Unable to assess  Posterior Calf Region  Unable to assess  Edema (RD Assessment)  Unable to assess  Hair  Unable to assess  Eyes  Unable to assess  Mouth  Unable to assess  Skin  Unable to assess  Nails  Unable to assess       Diet  Order:   Diet Order            Diet regular Room service appropriate? Yes; Fluid consistency: Thin  Diet effective now              EDUCATION NEEDS:   Education needs have been addressed  Skin:  Skin Assessment: Skin Integrity Issues: Skin Integrity Issues:: Incisions Incisions: medial lumbar  Last BM:  01/06/19  Height:   Ht Readings from Last 1 Encounters:  01/07/19 5\' 10"  (1.778 m)    Weight:   Wt Readings from Last 1 Encounters:  01/08/19 86.3 kg    Ideal Body Weight:  75.5 kg  BMI:  Body mass index is 27.31 kg/m.  Estimated Nutritional Needs:   Kcal:  2050-2250  Protein:  105-120 grams  Fluid:  > 2 L    Palmyra Rogacki A. Jimmye Norman, RD, LDN, [place] Registered Dietitian II Certified Diabetes Care and Education Specialist Pager: [phone] After hours Pager: [phone]

## 2019-01-08 NOTE — Progress Notes (Signed)
[place] PHYSICAL MEDICINE & REHABILITATION PROGRESS NOTE   Subjective/Complaints: Patient had a fair night.  He did develop some right first toe pain overnight however which seems to be impacting his movement this morning.  Does report that he feels a bit chilly as well.  ROS: Patient denies fever, rash, sore throat, blurred vision, nausea, vomiting, diarrhea, cough, shortness of breath or chest pain,headache, or mood change.    Objective:   No results found. Recent Labs    01/08/19 0606  WBC 8.9  HGB 11.1*  HCT 33.3*  PLT 253   Recent Labs    01/08/19 0606  NA 140  K 3.7  CL 104  CO2 28  GLUCOSE 97  BUN 13  CREATININE 1.00  CALCIUM 8.3*    Intake/Output Summary (Last 24 hours) at 01/08/2019 1010 Last data filed at 01/08/2019 0457 Gross per 24 hour  Intake 120 ml  Output 600 ml  Net -480 ml     Physical Exam: Vital Signs Blood pressure 114/70, pulse 81, temperature 98.3 F (36.8 C), temperature source Oral, resp. rate 16, height 5\' 10"  (1.778 m), weight 86.3 kg, SpO2 96 %. Constitutional: No distress . Vital signs reviewed. HEENT: EOMI, oral membranes moist Neck: supple Cardiovascular: RRR without murmur. No JVD    Respiratory: CTA Bilaterally without wheezes or rales. Normal effort    GI: BS +, non-tender, non-distended .  Musculoskeletal:Normal range of motion.  Right great toe with edema and pain as well as warmth at the first MTP.  Joint is warm.  Neurological: Alert. Follows commands. Oriented to person place and time. UE 5/5. LE 3/5 HF, KE and 4/5 ADF/PF. Senses pain and LT in all 4's. DTR's trace to 1+.  Neurological exam is stable today.Skin:  Back incision clean dry and intact diffuse erythema.  Flaking better with Eucerin cream apply application. Psychiatric: He has a normal mood and affect. Hisbehavior is normal.Judgmentand thought contentnormal.     Assessment/Plan: 1. Functional deficits secondary to lumbar HNP with  radiculopathy/decompression which require 3+ hours per day of interdisciplinary therapy in a comprehensive inpatient rehab setting.  Physiatrist is providing close team supervision and 24 hour management of active medical problems listed below.  Physiatrist and rehab team continue to assess barriers to discharge/monitor patient progress toward functional and medical goals  Care Tool:  Bathing              Bathing assist       Upper Body Dressing/Undressing Upper body dressing   What is the patient wearing?: Hospital gown only    Upper body assist Assist Level: Minimal Assistance - Patient > 75%    Lower Body Dressing/Undressing Lower body dressing      What is the patient wearing?: (nothing)     Lower body assist Assist for lower body dressing: (wearing nothing)     Toileting Toileting    Toileting assist Assist for toileting: 2 Helpers     Transfers Chair/bed transfer  Transfers assist     Chair/bed transfer assist level: Maximal Assistance - Patient 25 - 49%     Locomotion Ambulation   Ambulation assist   Ambulation activity did not occur: Safety/medical concerns(weakness)          Walk 10 feet activity   Assist           Walk 50 feet activity   Assist           Walk 150 feet activity   Assist  Walk 10 feet on uneven surface  activity   Assist Walk 10 feet on uneven surfaces activity did not occur: Safety/medical concerns(weakness)         Wheelchair     Assist Will patient use wheelchair at discharge?: Yes Type of Wheelchair: Manual    Wheelchair assist level: Supervision/Verbal cueing Max wheelchair distance: 150    Wheelchair 50 feet with 2 turns activity    Assist        Assist Level: Supervision/Verbal cueing   Wheelchair 150 feet activity     Assist     Assist Level: Supervision/Verbal cueing   Medical Problem List and Plan: 1.Decreased functional mobilitysecondary  to HNP L2-3 with 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 Status post laminotomy L3-4 with microdissection nerve root decompression partial foraminotomies 01/02/2019. No back brace needed. -Beginning therapies today 2. Antithrombotics: -DVT/anticoagulation:SCDs. Vascular study pending -antiplatelet therapy: N/A 3. Pain Management:Oxycodone and Ultram as needed, Zanaflex 4 mg 3 times daily as needed. Pain control appears adequate at present.  -Right toe suspicious for gout flare.  Begin trial of prednisone 20 mg twice daily.  Observe for further symptoms.  Patient denies history of gout 4. Mood:Ativan 0.5 mg twice daily -antipsychotic agents: N/A 5. Neuropsych: This patientiscapable of making decisions on hisown behalf. 6. Skin/Wound Care:Routine skin checks 7. Fluids/Electrolytes/Nutrition:Encourage appropriate p.o. intake.  Labs reviewed in detail. 8.Hypertension. Lisinopril 10 mg daily, Lopressor 25 mg twice daily.   -Blood pressure under control at present. 10.Alcohol use.Counseling 11.Constipation. Laxative assistance 12. Psoriasis. Kenalog cream twice daily             -moisturize skin, reviewed with patient.    LOS: 1 days A FACE TO FACE EVALUATION WAS PERFORMED  Meredith Staggers 01/08/2019, 10:10 AM

## 2019-01-08 NOTE — Progress Notes (Signed)
Physical Therapy Session Note  Patient Details  Name: Paul Pineda MRN: [id number] Date of Birth: 18-Mar-1946  Today's Date: 01/08/2019 PT Individual Time: 1335-1430 PT Individual Time Calculation (min): 55 min   Short Term Goals: Week 1:  PT Short Term Goal 1 (Week 1): pt will move supine> sit with supervision PT Short Term Goal 2 (Week 1): pt will transfer bed>< w/c with mod assist of 1 person PT Short Term Goal 3 (Week 1): pt will perform gait x 30'  with LRAD and 1 person PT Short Term Goal 4 (Week 1): pt will initiate stair training PT Short Term Goal 5 (Week 1): pt will propel w/c from room >< therapy room with distant supervision  Skilled Therapeutic Interventions/Progress Updates:  Pt received in w/c & agreeable to tx. Pt reports pain when asked but otherwise no c/o or behaviors demonstrating pain during session - rest breaks provided PRN. Pt appears very confused throughout session, frequently speaking of "blasting off into space" & checking his phone to see if "[employer]" called. Pt requires MAX cuing to recall back precautions but question pt's carryover. Pt transfers w/c>mat table with slide board & supervision with therapist only providing assistance for board placement & stability with occasional cuing for hand placement. Pt transfers sit>stand with steady by pulling up with BUE & supervision overall. Pt is able to tolerate standing 1 minute with multimodal cuing for anterior pelvic shift, hip extension and to upright trunk with only fair/poor return demo.  Pt transfers sit>stand with RW & mod assist with cuing for hand placement, requiring continued cuing for upright posture and pt progressed to marching in place with posture becoming more flexed and minimal hip/knee flexion & foot clearance during task. Pt then ambulates 10 ft x 2 trials with RW & min assist with decreased step length BLE, decreased stride length, decreased dorsiflexion & heel strike with forward flexed posture and B  hips/knees flexed. Pt reports need to use restroom and is returned to room. Pt used urinal in standing with continent void with min assist for balance and extra time. Pt completes hand hygiene at sink from w/c level with supervision and cuing to turn off water & dry hands. Pt propels w/c room<>nurses station with cuing for technique & supervision overall. Pt still requires max<>total assist to recall back precautions & back surgery at end of session. At end of session pt left in w/c with chair alarm donned & all needs at hand.   Pt able to demonstrate proper use of call bell after therapist reviewed it with pt.  Therapy Documentation Precautions:  Precautions Precautions: Fall, Back Precaution Booklet Issued: No Precaution Comments: Reviewed precautions; no brace Restrictions Weight Bearing Restrictions: No      Therapy/Group: Individual Therapy  Waunita Schooner 01/08/2019, 2:30 PM

## 2019-01-09 ENCOUNTER — Inpatient Hospital Stay (HOSPITAL_COMMUNITY): Payer: Medicare Other

## 2019-01-09 DIAGNOSIS — M7989 Other specified soft tissue disorders: Secondary | ICD-10-CM

## 2019-01-09 MED ORDER — PREDNISONE 5 MG PO TABS
10.0000 mg | ORAL_TABLET | Freq: Two times a day (BID) | ORAL | Status: AC
  Administered 2019-01-09 – 2019-01-10 (×2): 10 mg via ORAL
  Filled 2019-01-09 (×2): qty 2

## 2019-01-09 NOTE — Progress Notes (Signed)
[facility] PHYSICAL MEDICINE & REHABILITATION PROGRESS NOTE   Subjective/Complaints: Pt states his toe is feeling much better today. Nurse reported sedation and confusion in the evening and overnight. Pt seems to recall some of his delusions  ROS: Patient denies fever, rash, sore throat, blurred vision, nausea, vomiting, diarrhea, cough, shortness of breath or chest pain,   headache, or mood change.     Objective:   No results found. Recent Labs    01/08/19 0606  WBC 8.9  HGB 11.1*  HCT 33.3*  PLT 253   Recent Labs    01/08/19 0606  NA 140  K 3.7  CL 104  CO2 28  GLUCOSE 97  BUN 13  CREATININE 1.00  CALCIUM 8.3*    Intake/Output Summary (Last 24 hours) at 01/09/2019 1239 Last data filed at 01/09/2019 0915 Gross per 24 hour  Intake 960 ml  Output 550 ml  Net 410 ml     Physical Exam: Vital Signs Blood pressure 119/90, pulse 75, temperature 98.3 F (36.8 C), temperature source Oral, resp. rate 16, height 5\' 10"  (1.778 m), weight 86.3 kg, SpO2 100 %. Constitutional: No distress . Vital signs reviewed. HEENT: EOMI, oral membranes moist Neck: supple Cardiovascular: RRR without murmur. No JVD    Respiratory: CTA Bilaterally without wheezes or rales. Normal effort    GI: BS +, non-tender, non-distended  Musculoskeletal:Normal range of motion.  right first MTP minimally tender today Neurological: Alert. Follows commands. Oriented to person place and time. UE 5/5. LE 3/5 HF, KE and 4/5 ADF/PF. Senses pain and LT in all 4's. DTR's trace to 1+.  Neurological exam is stable today.Skin:  Back incision clean dry and intact diffuse erythema.  Flaking better with Eucerin cream application. Psychiatric:  Pleasant. seemed to be generally appropriate.     Assessment/Plan: 1. Functional deficits secondary to lumbar HNP with radiculopathy/decompression which require 3+ hours per day of interdisciplinary therapy in a comprehensive inpatient rehab  setting.  Physiatrist is providing close team supervision and 24 hour management of active medical problems listed below.  Physiatrist and rehab team continue to assess barriers to discharge/monitor patient progress toward functional and medical goals  Care Tool:  Bathing    Body parts bathed by patient: Right arm, Left arm, Chest, Abdomen, Front perineal area, Buttocks, Right upper leg, Left upper leg, Face   Body parts bathed by helper: Right lower leg, Left lower leg     Bathing assist Assist Level: Minimal Assistance - Patient > 75%     Upper Body Dressing/Undressing Upper body dressing   What is the patient wearing?: Button up shirt    Upper body assist Assist Level: Set up assist    Lower Body Dressing/Undressing Lower body dressing      What is the patient wearing?: Pants     Lower body assist Assist for lower body dressing: Moderate Assistance - Patient 50 - 74%     Toileting Toileting    Toileting assist Assist for toileting: Moderate Assistance - Patient 50 - 74%     Transfers Chair/bed transfer  Transfers assist     Chair/bed transfer assist level: Dependent - mechanical lift     Locomotion Ambulation   Ambulation assist   Ambulation activity did not occur: Safety/medical concerns(weakness; no +2 available)  Assist level: Minimal Assistance - Patient > 75% Assistive device: Walker-rolling Max distance: 150   Walk 10 feet activity   Assist     Assist level: Minimal Assistance - Patient > 75% Assistive  device: Walker-rolling   Walk 50 feet activity   Assist           Walk 150 feet activity   Assist    Assist level: Minimal Assistance - Patient > 75% Assistive device: Walker-rolling    Walk 10 feet on uneven surface  activity   Assist Walk 10 feet on uneven surfaces activity did not occur: Safety/medical concerns(weakness)         Wheelchair     Assist Will patient use wheelchair at discharge?: Yes Type of  Wheelchair: Manual    Wheelchair assist level: Supervision/Verbal cueing Max wheelchair distance: 150    Wheelchair 50 feet with 2 turns activity    Assist        Assist Level: Supervision/Verbal cueing   Wheelchair 150 feet activity     Assist     Assist Level: Supervision/Verbal cueing   Medical Problem List and Plan: 1.Decreased functional mobilitysecondary to HNP L2-3 with 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 Status post laminotomy L3-4 with microdissection nerve root decompression partial foraminotomies 01/02/2019. No back brace needed. -Continue CIR therapies including PT, OT  2. Antithrombotics: -DVT/anticoagulation:SCDs. Vascular study pending -antiplatelet therapy: N/A 3. Pain Management:confusion over last 24  -dc oxycodone  -use Ultram as needed, Zanaflex 4 mg 3 times daily as needed.    -Right toe suspicious for gout flare.    -improved today  -wean off prednisone  - Patient denies history of gout 4. Mood:Ativan 0.5 mg twice daily -antipsychotic agents: N/A 5. Neuropsych: This patientiscapable of making decisions on hisown behalf. 6. Skin/Wound Care:Routine skin checks 7. Fluids/Electrolytes/Nutrition: encourage PO 8.Hypertension. Lisinopril 10 mg daily, Lopressor 25 mg twice daily.   -Blood pressure under control at present. 10.Alcohol use.Counseling 11.Constipation. Laxative assistance 12. Psoriasis. Kenalog cream twice daily             -moisturize skin, reviewed with patient.    LOS: 2 days A FACE TO FACE EVALUATION WAS PERFORMED  Meredith Staggers 01/09/2019, 12:39 PM

## 2019-01-09 NOTE — Progress Notes (Addendum)
Physical Therapy Session Note  Patient Details  Name: Paul Pineda MRN: [id number] Date of Birth: 02-May-1946  Today's Date: 01/09/2019 PT Individual Time: 0805-0905, 1330-1430 PT Individual Time Calculation (min): 60, 60 min   Short Term Goals: Week 1:  PT Short Term Goal 1 (Week 1): pt will move supine> sit with supervision PT Short Term Goal 2 (Week 1): pt will transfer bed>< w/c with mod assist of 1 person PT Short Term Goal 3 (Week 1): pt will perform gait x 30'  with LRAD and 1 person PT Short Term Goal 4 (Week 1): pt will initiate stair training PT Short Term Goal 5 (Week 1): pt will propel w/c from room >< therapy room with distant supervision  Skilled Therapeutic Interventions/Progress Updates:   tx 1:  Pt resting in bed.  He seemed less confused and more conversational than yesterday.  Pain 3/10 surgical low back, premedicated.  Pt not oriented to date; generally oriented to situation and location.  Pt reported that he dreamed that he was in [place] last night, and got very little sleep.  Pt unable to state any back precautions.  Pt educated pt on those again.    Min assist and max cues for log rolling L and sitting up with min assist, in  Flat bed, no rails.  Using [place], sit> stand with max assist from raised bed.  Further sit>< stands from raised seat min assist.  In standing, wt shifting L><R without use of hands, without knees buckling.  W/c propulsion on unit x 150' with supervision, with cues for attending to environment and staying on r side of hall.  Pt is verbose and distracts himself with talking.  Up/down (2) 3" high steps bil rails, +2 for safety.  Pt pulled up on rails with max assist from w/c.  With mod cues, after demonstration, pt lead with R foot for step to method, ascending forwards, and lead with L foot descending backwards.  No knee buckling.  Sit> stand from w/c with max assist to Rw.  Gait training on level tile, no turns, x 150' with min assist  and cues for upright posture and forward gaze, with no improvement in posture.  No knee buckling.  Pt reported that he needed to use toilet for BM.  +2 for transfer to [facility] using [place].  At end of session, pt left with Zach, NT sitting on BSC.  tx 2:  Pt resting in bed.  Pt unable to state why he is here.  PT re-oriented pt.  Using schedule sheet, pt attempted to figure out which tx was starting, etc.  Pt unable to problem solve and use written sheet to do this, despite max VCs.  Pt was wearing reading glasses and was able to read font size.  Reviewed back precautions; pt able to recall 2/3 with extra time.  With max cues for log rolling in flat bed without rails, pt sat up with supervision.   Sit> stand from raised bed with min assist, stand pivot to w/c with RW, min assist.  W/c propulsion for activity tolerance, x 150' with supervision and cues for technique and steering.  Gait training x 100' inclduing turns with CGA, RW.    Therapeutic exercise performed with LEs to increase strength for functional mobility: from w/c level, KInetron at level 20 cm/sec x 25 cycles x 3.  Sit> stand at sturdy table, pushing up on w/c with bil hands, min assist. Pt stood x 5 minutes doing puzzle with long  vertical pieces.  Pt needed max cues to complete 1/4 of puzzle in 5 minutes., due to poor problem solving.  Pt stated that his back pain was increasing after 5 minutes; he sat in w/c with min assist.    Pt transferred back to bed as above via stand pivot.    Pt left resting in bed with needs at hand and alarm set, at end of session.      Therapy Documentation Precautions:  Precautions Precautions: Fall, Back Precaution Booklet Issued: No Precaution Comments: Reviewed precautions; no brace Restrictions Weight Bearing Restrictions: No   Pain: AM and PM txs:  Pain Assessment Pain Scale: 0-10 Pain Score: 3  Pain Type: Surgical pain Pain Location: Back Pain Orientation: Mid;Lower Pain Descriptors  / Indicators: Aching;Throbbing Pain Frequency: Constant Pain Onset: On-going Pain Intervention(s): Medication (See eMAR)      Therapy/Group: Individual Therapy  Jenee Spaugh 01/09/2019, 9:32 AM

## 2019-01-09 NOTE — Progress Notes (Signed)
Bilateral lower extremity venous duplex completed. Refer to "CV Proc" under chart review to view preliminary results.  Critical results discussed with Santiago Glad, RN who confirms she will relay results to Lauraine Rinne, PA-C.  01/09/2019 5:06 PM Maudry Mayhew, MHA, RVT, RDCS, RDMS

## 2019-01-09 NOTE — Progress Notes (Addendum)
Called on call PA to report DVT in left posterior tibial gastroc, no new orders at this time.

## 2019-01-09 NOTE — Progress Notes (Signed)
Occupational Therapy Session Note  Patient Details  Name: Paul Pineda MRN: [id number] Date of Birth: 06/16/1946  Today's Date: 01/09/2019 OT Individual Time: 5208-0223 OT Individual Time Calculation (min): 75 min    Short Term Goals: Week 1:  OT Short Term Goal 1 (Week 1): Pt will be able to complete squat pivot to toilet and or BSC with mod A of 1. OT Short Term Goal 2 (Week 1): Pt will be able to doff pants on BSC with min A using lateral leans and back precautions. OT Short Term Goal 3 (Week 1): Pt will be able to don pants on BSC with mod A using lateral leans and back precautions. OT Short Term Goal 4 (Week 1): Pt will be able to self cleanse on BSC with close S.  Skilled Therapeutic Interventions/Progress Updates:    Pt resting in w/c upon arrival and agreeable to participating in therapy.  OT intervention with focus on BADL training, AE education, sit<>stand, standing balance, functional transfers, activity tolerance, and safety awareness (including back precations) to increase independence with BADLs. Pt unable to recall back precautions. Pt oriented to date, place, and situation.  Pt engaged in bathing/dressing with sit<>stand from w/c at sink and from [facility]. Pt required assistance bathing BLE (lower legs) and pulling pants up.  Pt performed all transfers (scoot and squat pivot) with min A.  Pt performed sit<>stand from [facility] (at w/c height) with Steady A. Pt requires more than a reasonable amount of time to complete all tasks. Pt requires min verbal cues to redirect to tasks and is much the conversationalist. Pt remained seated in w/c with all needs within reach and belt alarm activated.   Therapy Documentation Precautions:  Precautions Precautions: Fall, Back Precaution Booklet Issued: No Precaution Comments: Reviewed precautions; no brace Restrictions Weight Bearing Restrictions: No   Pain:  Pt denies pain this morning   Therapy/Group: Individual Therapy  Leroy Libman 01/09/2019, 11:50 AM

## 2019-01-10 ENCOUNTER — Inpatient Hospital Stay (HOSPITAL_COMMUNITY): Payer: Medicare Other

## 2019-01-10 ENCOUNTER — Inpatient Hospital Stay (HOSPITAL_COMMUNITY): Payer: Medicare Other | Admitting: Occupational Therapy

## 2019-01-10 NOTE — Progress Notes (Signed)
Occupational Therapy Session Note  Patient Details  Name: Paul Pineda MRN: [id number] Date of Birth: 06/29/1946  Today's Date: 01/10/2019 OT Individual Time: 1115-1200 OT Individual Time Calculation (min): 45 min    Short Term Goals: Week 1:  OT Short Term Goal 1 (Week 1): Pt will be able to complete squat pivot to toilet and or BSC with mod A of 1. OT Short Term Goal 2 (Week 1): Pt will be able to doff pants on BSC with min A using lateral leans and back precautions. OT Short Term Goal 3 (Week 1): Pt will be able to don pants on BSC with mod A using lateral leans and back precautions. OT Short Term Goal 4 (Week 1): Pt will be able to self cleanse on BSC with close S.  Skilled Therapeutic Interventions/Progress Updates:     Pt received in w/c and agreeable to therapy. He propelled his wc to gym and then transferred to mat with min A semi stand-squat pivot.  On mat, worked on push ups focusing on use of LEs to lift hips using push up blocks and then without blocks.  Pt able to scoot pivot 8x to each side of the mat for 2 sets with S.   Worked on LE hamstring strength with pushing foot down on therapy ball in pulsing motions and then rolling ball back and forth.  Transferred back to wc to work on sit to stands and stand balance in parallel bars.  Pt pulled up to stand with min A and then worked on marching in place.  He tolerated being upright for 1-2 min then felt a little dizzy. Rested briefly then stood again to work on 10 heel raises, 10 alternating toe lifts, and 10 squats.   Pt was fully oriented to hospital, town, rehab unit, yr and month.  He could not recall what he did in earlier therapy sessions.  He did recall what therapists looked like.   Pt propelled w.c back to room and washed hands at sink to prep for lunch.Arlean Hopping alarm on and all needs met.    Therapy Documentation Precautions:  Precautions Precautions: Fall, Back Precaution Booklet Issued: No Precaution Comments:  Reviewed precautions; no brace Restrictions Weight Bearing Restrictions: No      Pain: Pain Assessment Pain Scale: 0-10 Pain Score: 0-No pain   Therapy/Group: Individual Therapy  [place] 01/10/2019, 12:18 PM

## 2019-01-10 NOTE — Progress Notes (Signed)
Physical Therapy Session Note  Patient Details  Name: Paul Pineda MRN: [id number] Date of Birth: February 03, 1946  Today's Date: 01/10/2019 PT Individual Time: [phone] and 1330-1430 PT Individual Time Calculation (min): 75 min and 60 min   Short Term Goals: Week 1:  PT Short Term Goal 1 (Week 1): pt will move supine> sit with supervision PT Short Term Goal 2 (Week 1): pt will transfer bed>< w/c with mod assist of 1 person PT Short Term Goal 3 (Week 1): pt will perform gait x 30'  with LRAD and 1 person PT Short Term Goal 4 (Week 1): pt will initiate stair training PT Short Term Goal 5 (Week 1): pt will propel w/c from room >< therapy room with distant supervision  Skilled Therapeutic Interventions/Progress Updates:     Session 1:  Patient in bed upon PT arrival. Patient alert and agreeable to PT session. Discussed results of positive doplar for a DVT in his L calf and risk factors and symptoms of a DVT at beginning of session. Per PT discussion with Linna Hoff, [place] piror to session, started session with bed level exercises until MD rounded on patient during session. MD cleared patient for all OOB activity with therapy after rounding on patient during session.   Therapeutic Activity: Bed Mobility: Patient performed supine to/from sit with supervision with bed flat and without rails. Provided verbal cues for log rolling to maintain spinal precautions. Transfers: Patient performed sit to/from stand x5 with CGA and a toilet transfer with min A using grab bars. Required supervision for peri-care and CGA for LB dressing.  Provided verbal cues for bending at the hips to maintain a flat back to maintain spinal precautions.  Gait Training:  Patient ambulated 150 feet using CGA for safety with RW. Ambulated with decreased gait speed, decreased step length and height, increased hip and knee flexion, forward trunk lean, and downward head gaze. Provided verbal cues for erect posture, looking ahead, walking close  to the RW, increased step height, and turning technique to maintain spinal precautions to prevent twisting.   Therapeutic Exercise: Patient performed the following exercises with verbal and tactile cues for proper technique. -B gluts sets x10 with 5 sec hold -R SLR x10  -R heel slides x10 -B quad sets x10  Patient in w/c at end of session with breaks locked, seat belt alarm set, and all needs within reach.   Session 2: Patient in bed upon PT arrival. Patient alert and agreeable to PT session.  Therapeutic Activity: Bed Mobility: Patient performed supine to/from sit with supervision with bed flat and without rails. Provided verbal cues for log rolling to maintain spinal precautions. Transfers: Patient performed sit to/from stand x5 with CGA and a toilet transfer with min A using grab bars. Required supervision for peri-care and CGA for LB dressing. He hit his hand on the toilet paper holder causing 2 minor skin abrasions on the back of his R hand that began to bleed during toileting, PT assisted with cleaning his hand and applying gauze with paper tape over the abrasions, RN made aware. Provided verbal cues for bending at the hips to maintain a flat back to maintain spinal precautions during transfers.   Gait Training:  Patient ambulated 150 feet using CGA for safety with RW. Ambulated with decreased gait speed, decreased step length and height, increased hip and knee flexion, forward trunk lean, and downward head gaze. Provided verbal cues for erect posture, looking ahead, walking close to the RW, increased step height, and  turning technique to maintain spinal precautions to prevent twisting.  Ambulated up/down a single 4" step using a RW with CGA x2. Provide cues for management of RW and bending a hips to maintain spinal precautions.  Went up/down 4 6" steps with B rails with CGA with step-to gait pattern leading with R going up and L coming down following demonstration and cues for proper  technique.  Therapeutic Exercise: Patient performed the following exercises with verbal and tactile cues for proper technique. -Step ups with L with B hand rails on 3" step 2x5 with CGA for safety -Attempted step up on 3" step with L LE with 1 UE supported and L knee buckled and patient required min A with B UE support to regain balance.   Patient in bed at end of session with breaks locked, bed alarm set, and all needs within reach.    Therapy Documentation Precautions:  Precautions Precautions: Fall, Back Precaution Booklet Issued: No Precaution Comments: Reviewed precautions; no brace Restrictions Weight Bearing Restrictions: No Vital Signs: Therapy Vitals Pulse Rate: 83 Resp: 18 BP: 138/76 Patient Position (if appropriate): Lying Oxygen Therapy SpO2: 100 % O2 Device: Room Air Pain: Patient denied pain throughout both sessions.    Therapy/Group: Individual Therapy  Kadarius Cuffe L Miriam Liles PT, DPT  01/10/2019, 4:34 PM

## 2019-01-10 NOTE — Progress Notes (Signed)
Occupational Therapy Session Note  Patient Details  Name: Paul Pineda MRN: [id number] Date of Birth: [birth year]  Today's Date: 01/10/2019 OT Individual Time: 1115-1200 OT Individual Time Calculation (min): 45 min    Short Term Goals: Week 1:  OT Short Term Goal 1 (Week 1): Pt will be able to complete squat pivot to toilet and or BSC with mod A of 1. OT Short Term Goal 2 (Week 1): Pt will be able to doff pants on BSC with min A using lateral leans and back precautions. OT Short Term Goal 3 (Week 1): Pt will be able to don pants on BSC with mod A using lateral leans and back precautions. OT Short Term Goal 4 (Week 1): Pt will be able to self cleanse on BSC with close S.  Skilled Therapeutic Interventions/Progress Updates:    Pt completed bathing and dressing from the wheelchair at the sink during session.  Mod instructional cueing to adhere to his back precautions, as he could state them but then continually on three occassions flexed forward to reach his toes and to also retrieve something from the floor.  He was able to complete UB bathing with supervision and then LB bathing with overall min assist.  Sit to stand and standing balance were completed with min assist.  Therapist assisted with washing his lower legs as well as donning gripper socks.  He was able to remove his LB clothing with use of the reacher and min assist prior to bathing.  He then threaded his clean pants with min assist as well using the reacher.  Finished session with pt in the wheelchair with call button and phone in reach and safety alarm belt in place.      Therapy Documentation Precautions:  Precautions Precautions: Fall, Back Precaution Booklet Issued: No Precaution Comments: Reviewed precautions; no brace Restrictions Weight Bearing Restrictions: No  Pain: Pain Assessment Pain Scale: Faces Pain Score: 0-No pain Faces Pain Scale: Hurts a little bit Pain Type: Acute pain Pain Location: Back Pain  Orientation: Lower Pain Descriptors / Indicators: Discomfort Pain Onset: With Activity Pain Intervention(s): Repositioned ADL: See Care Tool Section for some details of ADL  Therapy/Group: Individual Therapy  Latash Nouri OTR/L 01/10/2019, 12:20 PM

## 2019-01-10 NOTE — IPOC Note (Signed)
Overall Plan of Care [facility]) Patient Details Name: Paul Pineda MRN: [id number] DOB: 08-14-1945  Admitting Diagnosis: <principal problem not specified>  Hospital Problems: Active Problems:   Lumbar radiculopathy     Functional Problem List: Nursing Behavior, Bowel, Endurance, Safety, Pain, Skin Integrity, Bladder  PT Balance, Motor, Edema, Pain, Endurance  OT Balance, Cognition, Endurance, Motor, Sensory, Safety  SLP    TR         Basic ADL's: OT Bathing, Dressing, Toileting     Advanced  ADL's: OT       Transfers: PT Bed to Chair, Car, Bed Mobility  OT Toilet, Tub/Shower     Locomotion: PT Ambulation, Stairs, Wheelchair Mobility     Additional Impairments: OT None  SLP        TR      Anticipated Outcomes Item Anticipated Outcome  Self Feeding no goal, pt is I  Swallowing      Basic self-care  min A with LB dressing, bathing and toileting  Toileting  Min A   Bathroom Transfers MIn A to tub bench, S to toilet  Bowel/Bladder  continent of bowel and bladder  Transfers  S basic and car  Locomotion  S gait x 150' wiht LRAD and up/down 4 steps bil rails; min assist up/down 3 STE home without rails, with LRAD; mod I for w/c propulsion x 150'  Communication     Cognition     Pain  pain less than 3 on scale of 0-10  Safety/Judgment  remain free of injury. fall prevention   Therapy Plan: PT Intensity: Minimum of 1-2 x/day ,45 to 90 minutes PT Frequency: 5 out of 7 days PT Duration Estimated Length of Stay: 14-18 OT Intensity: Minimum of 1-2 x/day, 45 to 90 minutes OT Frequency: 5 out of 7 days OT Duration/Estimated Length of Stay: 14-18 days     Due to the current state of emergency, patients may not be receiving their 3-hours of Medicare-mandated therapy.   Team Interventions: Nursing Interventions Bowel Management, Pain Management, Psychosocial Support, Bladder Management, Cognitive Remediation/Compensation, Skin Care/Wound Management,  Patient/Family Education  PT interventions Ambulation/gait training, Training and development officer, Cognitive remediation/compensation, Community reintegration, Discharge planning, DME/adaptive equipment instruction, Functional electrical stimulation, Functional mobility training, Neuromuscular re-education, Pain management, Patient/family education, Psychosocial support, Splinting/orthotics, Stair training, Therapeutic Activities, Therapeutic Exercise, UE/LE Strength taining/ROM, UE/LE Coordination activities, Wheelchair propulsion/positioning  OT Interventions Training and development officer, Cognitive remediation/compensation, Discharge planning, DME/adaptive equipment instruction, Disease mangement/prevention, Functional mobility training, Neuromuscular re-education, Patient/family education, Pain management, Psychosocial support, Self Care/advanced ADL retraining, Therapeutic Activities, Therapeutic Exercise, UE/LE Strength taining/ROM, UE/LE Coordination activities  SLP Interventions    TR Interventions    SW/CM Interventions Discharge Planning, Psychosocial Support, Patient/Family Education   Barriers to Discharge MD  Medical stability  Nursing      PT      OT      SLP      SW       Team Discharge Planning: Destination: PT-Home ,OT- Home , SLP-  Projected Follow-up: PT-Home health PT, OT-  Home health OT, SLP-  Projected Equipment Needs: PT-To be determined, OT- Tub/shower bench, To be determined, 3 in 1 bedside comode, SLP-  Equipment Details: PT-owns a RW and a 3 wheeled walker, transport chair, OT-  Patient/family involved in discharge planning: PT- Patient,  OT-Patient(Pt with decreased comprehension, memory and processing.  ), SLP-   MD ELOS: 14-18 days Medical Rehab Prognosis:  Excellent Assessment: The patient has been admitted for CIR therapies with the  diagnosis of lumbar stenosis with radiculopathy. The team will be addressing functional mobility, strength, stamina, balance,  safety, adaptive techniques and equipment, self-care, bowel and bladder mgt, patient and caregiver education, pain mgt, NMR, surgical precautions. Goals have been set at min assist for self-care and supervision for basic mobility .   Due to the current state of emergency, patients may not be receiving their 3 hours per day of Medicare-mandated therapy.    Meredith Staggers, MD, FAAPMR      See Team Conference Notes for weekly updates to the plan of care

## 2019-01-10 NOTE — Care Management (Signed)
Inpatient [facility] Individual Statement of Services  Patient Name:  Paul Pineda  Date:  01/10/2019  Welcome to the [facility].  Our goal is to provide you with an individualized program based on your diagnosis and situation, designed to meet your specific needs.  With this comprehensive rehabilitation program, you will be expected to participate in at least 3 hours of rehabilitation therapies Monday-Friday, with modified therapy programming on the weekends.  Your rehabilitation program will include the following services:  Physical Therapy (PT), Occupational Therapy (OT), Speech Therapy (ST), 24 hour per day rehabilitation nursing, Therapeutic Recreaction (TR), Neuropsychology, Case Management (Social Worker), Rehabilitation Medicine, Nutrition Services and Pharmacy Services  Weekly team conferences will be held on Tuesdays to discuss your progress.  Your Social Worker will talk with you frequently to get your input and to update you on team discussions.  Team conferences with you and your family in attendance may also be held.  Expected length of stay: 2 weeks   Overall anticipated outcome: supervision to min assist  Depending on your progress and recovery, your program may change. Your Social Worker will coordinate services and will keep you informed of any changes. Your Social Worker's name and contact numbers are listed  below.  The following services may also be recommended but are not provided by the [place] will be made to provide these services after discharge if needed.  Arrangements include referral to agencies that provide these services.  Your insurance has been verified to be:  [employer] Your primary doctor is:  [name]  Pertinent information will be shared with your doctor and your insurance  company.  Social Worker:  [place], [place] or ([phone]   Information discussed with and copy given to patient by: Lennart Pall, 01/10/2019, 4:44 PM

## 2019-01-10 NOTE — Progress Notes (Signed)
Social Work  Social Work Assessment and Plan  Patient Details  Name: Paul Pineda MRN: [id number] Date of Birth: 05/12/1946  Today's Date: 01/08/2019  Problem List:  Patient Active Problem List   Diagnosis Date Noted  . Lumbar radiculopathy 01/07/2019  . Lumbar herniated disc 01/06/2019  . HNP (herniated nucleus pulposus), lumbar 01/02/2019  . Aortic atherosclerosis ([place]) 10/09/2018  . 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 Sensorineural hearing loss (SNHL), bilateral 11/22/2017  . Tinnitus, bilateral 11/22/2017  . Syncope and collapse 08/15/2013  . Tachycardia 08/15/2013  . Dehydration 08/15/2013  . Postoperative anemia due to acute blood loss 07/15/2013  . OA (osteoarthritis) of knee 07/14/2013   Past Medical History:  Past Medical History:  Diagnosis Date  . Arthritis   . Cancer (HCC)    chest lump ,scrotum  . Cataract    right  . Heart murmur   . HOH (hard of hearing)   . Hyperlipidemia   . Hypertension   . Left bundle branch block   . Pneumonia    hx  . Psoriasis    Past Surgical History:  Past Surgical History:  Procedure Laterality Date  . CARDIAC CATHETERIZATION  10/11/2009   no sign CAD, EF 45-50%  . COLONOSCOPY    . INGUINAL HERNIA REPAIR Right 04/29/2013   Procedure: HERNIA REPAIR INGUINAL ADULT;  Surgeon: Ralene Ok, MD;  Location: [facility];  Service: General;  Laterality: Right;  . INSERTION OF MESH Right 04/29/2013   Procedure: INSERTION OF MESH;  Surgeon: Ralene Ok, MD;  Location: [facility];  Service: General;  Laterality: Right;  . LUMBAR LAMINECTOMY/DECOMPRESSION MICRODISCECTOMY Left 01/02/2019   Procedure: Microdiscectomy - L2-L3 - DLL L3-L4 - left LUMBAR TWO-THREE MICRODISCETOMY WITH LEFT LUMBAR THREE-FOUR DECOMPRESSIVE LUMBAR DECOMPRESSIVE LAMINECTOMY;  Surgeon: Kary Kos, MD;  Location: [facility];  Service: Neurosurgery;  Laterality: Left;  . lumps     scrotum, chest   . TOTAL KNEE ARTHROPLASTY Right 07/14/2013   Procedure: RIGHT TOTAL KNEE  ARTHROPLASTY;  Surgeon: Gearlean Alf, MD;  Location: [facility];  Service: Orthopedics;  Laterality: Right;   Social History:  reports that he quit smoking about 33 years ago. His smoking use included cigarettes. He has a 20.00 pack-year smoking history. He has never used smokeless tobacco. He reports previous alcohol use of about 14.0 standard drinks of alcohol per week. He reports that he does not use drugs.  Family / Support Systems Marital Status: Married Patient Roles: Spouse Spouse/Significant Other: wife, Paul Pineda @ [phone] Children: daughter, Paul Pineda @ [phone];  daughter, Paul Pineda @ [phone] Anticipated Caregiver: wife Ability/Limitations of Caregiver: Wife notes that she does work days and cannot provide 24/7 Caregiver Availability: Intermittent Family Dynamics: Pt states his wife is "MIA", however, cannot explain what he means with this.  Pt is confused.  Wife expressing concern about pt's current confused state and frustration with not being able to see him in person.  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   Abuse/Neglect Abuse/Neglect Assessment Can Be Completed: Yes Physical Abuse: Denies Verbal Abuse: Denies Sexual Abuse: Denies Exploitation of patient/patient's resources: Denies Self-Neglect: Denies  Emotional Status Pt's affect, behavior and adjustment status: Pt very pleasantly greets me, however, it is quick to see that he is  confused and rambles between topics having to do with people who "up to something.  I don't know what.  My wife is MIA." Attempted to discuss his back surgery but he repeats X 2 that back surgery "was the plan but I did ok  just doing therapy for it."  Pt does not understand that he is to stay in the hospital.  Polite but significantly confused.  Will discuss further with MD. Recent Psychosocial Issues: Several falls recently and declining mobility. Psychiatric History: None Substance Abuse History: Family reports pt is a "heavy drinker" for many years.  Patient / Family Perceptions, Expectations & Goals Pt/Family understanding of illness & functional limitations: As noted, pt unable to explain his medical issues.  Wife with good understanding of medical issues, current functional limitations/ need for CIR. Premorbid pt/family roles/activities: Pt has had severaly falls over the prior few weeks and wife was providing assistance. Anticipated changes in roles/activities/participation: Per goals of supervision to min assist, wife will need to continue providing caregiver assistance and will need to consider how she can provide 24/7 support. Pt/family expectations/goals: Pt eager to go home ASAP.  Wife hopeful goals will be upgraded as she cannot provide 24/7 support.  Community Resources [employer]: None Premorbid Home Care/DME Agencies: None Transportation available at discharge: yes Resource referrals recommended: Neuropsychology, Advocacy groups  Discharge Planning Living Arrangements: Spouse/significant other Support Systems: Spouse/significant other, Children Type of Residence: Private residence Insurance Resources: Medicare(Blue Medicare) Financial Resources: Radio broadcast assistant Screen Referred: No Does the patient have any problems obtaining your medications?: No Home Management: Recently most home management being done by wife. Patient/Family Preliminary Plans: Pt planning to return home.  D/c plan will be dependent on gains made. Social Work Anticipated Follow Up Needs: HH/OP Expected length of stay: 14-18 days  Clinical Impression Pleasantly confused gentleman here following back  surgery.  Team and wife hopeful the confusion will resolve, however, goals currently set for supervision to min assist.  Wife unable to provide 24/7 care due to work and is aware that SNF would need to be considered if this is still the recommendation for d/c.  Will follow closely and assist with d/c planning needs and family updates.  Paul Pineda 01/08/2019, 2:02 PM

## 2019-01-10 NOTE — Progress Notes (Signed)
[facility] PHYSICAL MEDICINE & REHABILITATION PROGRESS NOTE   Subjective/Complaints: Pain is controlled. Doing fine off oxycodone. Had questions about DVT in left leg  ROS: Patient denies fever, rash, sore throat, blurred vision, nausea, vomiting, diarrhea, cough, shortness of breath or chest pain,  headache, or mood change.    Objective:   Vas [place] Lower Extremity Venous (dvt)  Result Date: 01/09/2019  Lower Venous Study Indications: Swelling.  Comparison Study: No prior study. Performing Technologist: Maudry Mayhew, MHA, RDMS, RVT, RDCS  Examination Guidelines: A complete evaluation includes B-mode imaging, spectral Doppler, color Doppler, and power Doppler as needed of all accessible portions of each vessel. Bilateral testing is considered an integral part of a complete examination. Limited examinations for reoccurring indications may be performed as noted.  +---------+---------------+---------+-----------+----------+--------------+ RIGHT    CompressibilityPhasicitySpontaneityPropertiesSummary        +---------+---------------+---------+-----------+----------+--------------+ CFV      Full           No       Yes                  Pulsatile flow +---------+---------------+---------+-----------+----------+--------------+ SFJ      Full                                                        +---------+---------------+---------+-----------+----------+--------------+ FV Prox  Full                                                        +---------+---------------+---------+-----------+----------+--------------+ FV Mid   Full                                                        +---------+---------------+---------+-----------+----------+--------------+ FV DistalFull                                                        +---------+---------------+---------+-----------+----------+--------------+ PFV      Full                                                         +---------+---------------+---------+-----------+----------+--------------+ POP      Full           No       Yes                  Pulsatile flow +---------+---------------+---------+-----------+----------+--------------+ PTV      Full                                                        +---------+---------------+---------+-----------+----------+--------------+  PERO     Full                                                        +---------+---------------+---------+-----------+----------+--------------+   +---------+---------------+---------+-----------+----------+--------------+ LEFT     CompressibilityPhasicitySpontaneityPropertiesSummary        +---------+---------------+---------+-----------+----------+--------------+ CFV      Full           No       Yes                  Pulsatile flow +---------+---------------+---------+-----------+----------+--------------+ SFJ      Full                                                        +---------+---------------+---------+-----------+----------+--------------+ FV Prox  Full                                                        +---------+---------------+---------+-----------+----------+--------------+ FV Mid   Full                                                        +---------+---------------+---------+-----------+----------+--------------+ FV DistalFull                                                        +---------+---------------+---------+-----------+----------+--------------+ PFV      Full                                                        +---------+---------------+---------+-----------+----------+--------------+ POP      Full           No       Yes                  Pulsatile flow +---------+---------------+---------+-----------+----------+--------------+ PTV      None                    No                   Acute           +---------+---------------+---------+-----------+----------+--------------+ PERO     Full                                                        +---------+---------------+---------+-----------+----------+--------------+ Gastroc  None  No                   Acute          +---------+---------------+---------+-----------+----------+--------------+     Summary: Right: There is no evidence of deep vein thrombosis in the lower extremity. No cystic structure found in the popliteal fossa. Left: Findings consistent with acute deep vein thrombosis involving the left posterior tibial vein, and left gastrocnemius vein. No cystic structure found in the popliteal fossa.  Bilateral lower extremity veins exhibit pulsatile flow, suggestive of possibly elevated right-sided heart pressure. *See table(s) above for measurements and observations. Electronically signed by Servando Snare MD on 01/09/2019 at 5:29:45 PM.    Final    Recent Labs    01/08/19 0606  WBC 8.9  HGB 11.1*  HCT 33.3*  PLT 253   Recent Labs    01/08/19 0606  NA 140  K 3.7  CL 104  CO2 28  GLUCOSE 97  BUN 13  CREATININE 1.00  CALCIUM 8.3*    Intake/Output Summary (Last 24 hours) at 01/10/2019 1134 Last data filed at 01/10/2019 0900 Gross per 24 hour  Intake 600 ml  Output 200 ml  Net 400 ml     Physical Exam: Vital Signs Blood pressure 130/88, pulse 60, temperature (!) 97.5 F (36.4 C), temperature source Oral, resp. rate 18, height 5\' 10"  (1.778 m), weight 86.3 kg, SpO2 98 %. Constitutional: No distress . Vital signs reviewed. HEENT: EOMI, oral membranes moist Neck: supple Cardiovascular: RRR without murmur. No JVD    Respiratory: CTA Bilaterally without wheezes or rales. Normal effort    GI: BS +, non-tender, non-distended  Musculoskeletal:Normal range of motion.  no foot pain today Neurological: Alert. Follows commands. Oriented to person place and time. UE 5/5. LE 3/5 HF, KE and 4/5  ADF/PF. Senses pain and LT in all 4's. DTR's trace to 1+.  Neurological exam is stable today.Skin:  Back incision clean dry and intact diffuse erythema.  Skin dry despite applications of lotion.  Psychiatric:  Pleasant. seemed to be generally appropriate.     Assessment/Plan: 1. 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  Physiatrist is providing close team supervision and 24 hour management of active medical problems listed below.  Physiatrist and rehab team continue to assess barriers to discharge/monitor patient progress toward functional and medical goals  Care Tool:  Bathing    Body parts bathed by patient: Right arm, Left arm, Chest, Abdomen, Front perineal area, Buttocks, Right upper leg, Left upper leg, Face   Body parts bathed by helper: Right lower leg, Left lower leg     Bathing assist Assist Level: Minimal Assistance - Patient > 75%     Upper Body Dressing/Undressing Upper body dressing   What is the patient wearing?: Button up shirt    Upper body assist Assist Level: Set up assist    Lower Body Dressing/Undressing Lower body dressing      What is the patient wearing?: Pants     Lower body assist Assist for lower body dressing: Moderate Assistance - Patient 50 - 74%     Toileting Toileting    Toileting assist Assist for toileting: Moderate Assistance - Patient 50 - 74% Assistive Device Comment: urinal   Transfers Chair/bed transfer  Transfers assist     Chair/bed transfer assist level: Dependent - mechanical lift     Locomotion Ambulation   Ambulation  assist   Ambulation activity did not occur: Safety/medical concerns(weakness; no +2 available)  Assist level: Minimal Assistance - Patient > 75% Assistive device: Walker-rolling Max distance: 150   Walk 10 feet activity   Assist     Assist level:  Minimal Assistance - Patient > 75% Assistive device: Walker-rolling   Walk 50 feet activity   Assist           Walk 150 feet activity   Assist    Assist level: Minimal Assistance - Patient > 75% Assistive device: Walker-rolling    Walk 10 feet on uneven surface  activity   Assist Walk 10 feet on uneven surfaces activity did not occur: Safety/medical concerns(weakness)         Wheelchair     Assist Will patient use wheelchair at discharge?: Yes Type of Wheelchair: Manual    Wheelchair assist level: Supervision/Verbal cueing Max wheelchair distance: 150    Wheelchair 50 feet with 2 turns activity    Assist        Assist Level: Supervision/Verbal cueing   Wheelchair 150 feet activity     Assist     Assist Level: Supervision/Verbal cueing   Medical Problem List and Plan: 1.Decreased functional mobilitysecondary to HNP L2-3 with 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 Status post laminotomy L3-4 with microdissection nerve root decompression partial foraminotomies 01/02/2019. No back brace needed. -Continue CIR therapies including PT, OT  2. Antithrombotics: -DVT/anticoagulation:SCDs. left posterior-tib/gastroc thrombus  -mobilize patient as much as possible  -f/u doppler prior to discharge on  -antiplatelet therapy: N/A 3. Pain Management:confusion over last 24  -dc oxycodone  -use Ultram as needed, Zanaflex 4 mg 3 times daily as needed.    -Right toe suspicious for gout flare.    -improved   -prednisone completed  - Patient denies history of gout 4. Mood:Ativan 0.5 mg twice daily -antipsychotic agents: N/A 5. Neuropsych: This patientiscapable of making decisions on hisown behalf. 6. Skin/Wound Care:Routine skin checks 7. Fluids/Electrolytes/Nutrition: encourage PO 8.Hypertension. Lisinopril 10 mg daily, Lopressor 25 mg twice daily.   -Blood pressure under control  6/12 10.Alcohol use.Counseling 11.Constipation. Laxative assistance 12. Psoriasis. Kenalog cream twice daily             -moisturize skin.    LOS: 3 days A FACE TO FACE EVALUATION WAS PERFORMED  Meredith Staggers 01/10/2019, 11:34 AM

## 2019-01-11 NOTE — Progress Notes (Signed)
At patient's request, writer accompanied patient on a walk around the unit. Patient walked the length of both halls and through the dayroom using the front wheel walker.

## 2019-01-11 NOTE — Progress Notes (Signed)
[facility] PHYSICAL MEDICINE & REHABILITATION PROGRESS NOTE   Subjective/Complaints: Patient feels well.  He has no complaints.  He denies any pain.  Denies chest pain or shortness of breath.  He would like to be able to maneuver around the hospital today.   Objective:   Physical Exam: Vital Signs Blood pressure 120/74, pulse 66, temperature 97.9 F (36.6 C), temperature source Oral, resp. rate 14, height 5\' 10"  (1.778 m), weight 86.3 kg, SpO2 97 %. Pleasant male in no acute distress.  HEENT exam atraumatic, normocephalic, extraocular muscles are intact.  Neck is supple without lymphadenopathy.  Chest clear to auscultation.  Cardiac exam S1 and S2 are regular.  Abdominal exam active bowel sounds, soft, nontender.  Extremities without edema.  Dermatologic exam he has multiple ecchymoses on his upper extremities.    Assessment/Plan: 1. 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 Status post laminotomy L3-4 with microdissection nerve root decompression partial foraminotomies 01/02/2019. No back brace needed. -Continue CIR therapies including PT, [name], Lopressor 25 mg twice daily.   -Blood pressure under control 6/12 10.Alcohol use. 11.Constipation.  Resolved. 12. Psoriasis. Kenalog cream twice daily             -moisturize skin.    LOS: 4 days A FACE TO FACE EVALUATION WAS PERFORMED  Paul Pineda 01/11/2019, 9:35 AM

## 2019-01-12 ENCOUNTER — Inpatient Hospital Stay (HOSPITAL_COMMUNITY): Payer: Medicare Other | Admitting: Occupational Therapy

## 2019-01-12 ENCOUNTER — Inpatient Hospital Stay (HOSPITAL_COMMUNITY): Payer: Medicare Other

## 2019-01-12 NOTE — Progress Notes (Signed)
[facility] PHYSICAL MEDICINE & REHABILITATION PROGRESS NOTE   Subjective/Complaints: Patient feels well.  He has no complaints.  He feels like he is getting stronger.  He feels like he is steady on his feet.  Objective:   Physical Exam: Vital Signs Blood pressure 134/77, pulse 79, [name], temperature source Oral, resp. rate 16, height 5\' 10"  (1.778 m), weight 87.7 kg, [name], normocephalic, [name], soft and nontender. Extremities no edema. Neurologic exam is alert  Dermatologic exam he has multiple ecchymoses on his upper extremities.  Assessment/Plan: 1. 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 Status post laminotomy L3-4 with microdissection nerve root decompression partial foraminotomies 01/02/2019. No back brace needed. -Continue CIR therapies including PT, [name], Lopressor 25 mg twice daily.   -Blood pressure under control 6/12 10.Alcohol use. 11.Constipation. Resolved. 12. Psoriasis. Kenalog cream twice  daily             -moisturize skin.    LOS: 5 days A FACE TO FACE EVALUATION WAS PERFORMED  Paul Pineda 01/12/2019, 7:12 AM

## 2019-01-12 NOTE — Progress Notes (Signed)
Physical Therapy Session Note  Patient Details  Name: Paul Pineda MRN: [id number] Date of Birth: [birth year]  Today's Date: 01/12/2019 PT Individual Time: 0900-1000 and 1515-1600 PT Individual Time Calculation (min): 60 min , 45 min  Short Term Goals: Week 1:  PT Short Term Goal 1 (Week 1): pt will move supine> sit with supervision PT Short Term Goal 2 (Week 1): pt will transfer bed>< w/c with mod assist of 1 person PT Short Term Goal 3 (Week 1): pt will perform gait x 30'  with LRAD and 1 person PT Short Term Goal 4 (Week 1): pt will initiate stair training PT Short Term Goal 5 (Week 1): pt will propel w/c from room >< therapy room with distant supervision  Skilled Therapeutic Interventions/Progress Updates:   tx 1:  Pt resting in bed; no c/o pain.  His affect, conversation and behavior were much improved since this therapist last saw pt on 01/09/19.  MD changed pain meds. He stated that his R knee was sore in the joint yesterday after PT.   Reviewed log rolling for bed mobility.  Pt able to state 2/3 precautions, but does not remember them functionally.  Log rolling L and sitting up with supervision.  Sit> stand with close supervision and cues for safe hand placement to push up on bed rather than pull on RW.  Stand pivot transer with supervision.  W/c propulsion  using bil UEs with supervision.  Pt did not veer around, staying on R side of hall without cues.  Gait training x 100' , x 20' with RW, close supervision. Up/down 4'' curb/step with RW, CGA, x 2.  Mod cues for sequencing.  Up/down 7" curb/step with RW, x 2 with CGA with min cues for sequencing.  PT Provided another RW , taller, which improved pt's upright stance and forward gaze.  Therapeutic exercise performed with LEs to increase strength for functional mobility: from w/c level, 20 x 1 each heel raises; 20 x 1 R/L ankle pumps with extended knee; use of Kinetron at level 30 cm/sec x 30 cycles bil; at level 40 x 20 cycles LLE  unilaterally.  Pt reported "it's pretty hard to push with my L". 20 x 1 bil adductor squeezes, bil glut sets.  In sitting with extended knee, R hamstrings noted to be tighter than L.  At end of session, pt seated in w/c with seat belt alarm set and needs at hand.  tx 2:  Pt resting in bed.  Supine (HOB raised approx 30 deres due to back pain) > sit on R side of bed as per home situation, supervision, without cues needed for back precautions.  Stand pivot transfer with supervisiion.  Sit> stand from w/c to counter, to start [employer].  Pt reported feeling "woozy" and needing to sit.  Dizziness continued in sitting x 2 minutes. See vitals. PT called Ed who dispensed pain meds.  Therapeutic exercises performed with LE to increase strength for functional mobility: 5 x 1 each: seated R/L long arc quad knee extension with ankle pumps at end range.  In supine: 10 x 2 cervical flexion, bil hip adduction with pelvic tilt; 10 x 1 each: side lying L/R for R/L hip abduction with flexed knees and hips and isolated hip extension (active assistive) with flexed knees.  At end of session, pt resting in bed with alarm set and needs at hand.      Therapy Documentation Precautions:  Precautions Precautions: Fall, Back Precaution Booklet Issued: No Precaution  Comments: Reviewed precautions; no brace Restrictions Weight Bearing Restrictions: No   Vital Signs: Therapy Vitals Pulse Rate: 78 BP: 132/78 Pain: AM- pt denies PM- 4/10 low back; pt declined meds but later in session agreed when Ed, RN suggested them         Therapy/Group: Individual Therapy  Jaquala Fuller 01/12/2019, 10:13 AM

## 2019-01-12 NOTE — Progress Notes (Signed)
Occupational Therapy Session Note  Patient Details  Name: Paul Pineda MRN: [id number] Date of Birth: Jun 10, 1946  Today's Date: 01/12/2019 OT Individual Time: 1016-1100 and 1300-1350 OT Individual Time Calculation (min): 44 min and 50 min  Short Term Goals: Week 1:  OT Short Term Goal 1 (Week 1): Pt will be able to complete squat pivot to toilet and or BSC with mod A of 1. OT Short Term Goal 2 (Week 1): Pt will be able to doff pants on BSC with min A using lateral leans and back precautions. OT Short Term Goal 3 (Week 1): Pt will be able to don pants on BSC with mod A using lateral leans and back precautions. OT Short Term Goal 4 (Week 1): Pt will be able to self cleanse on BSC with close S.  Skilled Therapeutic Interventions/Progress Updates:    Pt greeted in w/c with no c/o pain. Able to state 2/3 back precautions when asked. Requesting to shower. He ambulated with RW and steady assist to TTB with vcs for transfer technique. Pt doffed pants and shoes with balance assist. Issued him with LH sponge. Encouraged use of sponge vs bending forward to wash feet. However pt often bent forward towards feet and needed vcs to recognize and use AE instead. He was able to utilize seated figure 4 to wash between toes and rinse feet without breaking precautions. Steady assist for standing balance when he donned clean brief and hospital gown. OT donned gripper socks for time mgt. After, he ambulated back to bed using RW with steady assist again. With RN ok, set him up with skin cream to apply. Pt left in care of RN at end of session.   2nd Session 1:1 tx (50 min) Pt greeted finishing up lunch. No c/o pain. We discussed his DME needs at d/c. Pt reports that he has already purchased a TTB for his tub shower. He also has a hand held shower hose, BSC, and grab bars installed by the toilet. Supervision for supine<sit with HOB elevated and steady assist for stand pivot<w/c. Pt completed shaving w/c level at sink with  setup. Afterwards, he needed to use the restroom. Pt ambulated with RW and steady assist to toilet. He had continent B+B void, able to complete perihygiene while sitting using lateral leans. Pt proceeded to complete UB/LB dressing sit<stand from toilet. He required vcs for leaning trunk back when donning underwear via figure 4 position. [place]acher was used for threading pants. Pt donned his shirt and jacket with setup. He then ambulated back to bed and transitioned to supine via logroll technique. Pt left with all needs within reach and bed alarm set.    Therapy Documentation Precautions:  Precautions Precautions: Fall, Back Precaution Booklet Issued: No Precaution Comments: Reviewed precautions; no brace Restrictions Weight Bearing Restrictions: No ADL:       Therapy/Group: Individual Therapy   Jansen Goodpasture A Malikiah Debarr 01/12/2019, 12:30 PM

## 2019-01-13 ENCOUNTER — Inpatient Hospital Stay (HOSPITAL_COMMUNITY): Payer: Medicare Other | Admitting: Occupational Therapy

## 2019-01-13 ENCOUNTER — Inpatient Hospital Stay (HOSPITAL_COMMUNITY): Payer: Medicare Other

## 2019-01-13 NOTE — Progress Notes (Signed)
Occupational Therapy Session Note  Patient Details  Name: Paul Pineda MRN: [id number] Date of Birth: 22-Jan-1946  Today's Date: 01/13/2019 OT Individual Time: 1194-1740 OT Individual Time Calculation (min): 76 min    Short Term Goals: Week 1:  OT Short Term Goal 1 (Week 1): Pt will be able to complete squat pivot to toilet and or BSC with mod A of 1. OT Short Term Goal 2 (Week 1): Pt will be able to doff pants on BSC with min A using lateral leans and back precautions. OT Short Term Goal 3 (Week 1): Pt will be able to don pants on BSC with mod A using lateral leans and back precautions. OT Short Term Goal 4 (Week 1): Pt will be able to self cleanse on BSC with close S.  Skilled Therapeutic Interventions/Progress Updates:    Upon entering the room, pt seated in wheelchair awaiting OT arrival. Pt able to verbalize 2/3 back precautions this session and OT reviewing again for pt. Pt requesting to wash self at sink and change clothing this session. Pt ambulating with RW and CGA to bathroom for toileting needs. Pt needing steady assist for balance during hygiene and clothing management. Pt returning to sit in wheelchair and bathing self with increased time and OT assisting pt with donning medicated lotion secondary to psoriasis flare up. Pt donning clothing items with min guard for standing balance during LB clothing management. Pt utilized figure four position to thread clothing with cuing from therapist to maintain back precautions. Pt ambulating 150' with RW and CGA with min cuing for forward gaze. Pt taking seated rest break and returning back to room in same manner. Pt seated in wheelchair with chair alarm belt donned and call bell within reach upon exiting the room.   Therapy Documentation Precautions:  Precautions Precautions: Fall, Back Precaution Booklet Issued: No Precaution Comments: Reviewed precautions; no brace Restrictions Weight Bearing Restrictions: No    Pain: Pain  Assessment Pain Scale: 0-10 Pain Score: 0-No pain Faces Pain Scale: No hurt   Therapy/Group: Individual Therapy  Gypsy Decant 01/13/2019, 1:59 PM

## 2019-01-13 NOTE — Progress Notes (Addendum)
Physical Therapy Session Note  Patient Details  Name: Paul Pineda MRN: [id number] Date of Birth: 10/11/1945  Today's Date: 01/13/2019 PT Individual Time: 0800-0900 , 1330-1430 PT Individual Time Calculation (min): 60 min , 60 min  Short Term Goals: Week 1:  PT Short Term Goal 1 (Week 1): pt will move supine> sit with supervision PT Short Term Goal 2 (Week 1): pt will transfer bed>< w/c with mod assist of 1 person PT Short Term Goal 3 (Week 1): pt will perform gait x 30'  with LRAD and 1 person PT Short Term Goal 4 (Week 1): pt will initiate stair training PT Short Term Goal 5 (Week 1): pt will propel w/c from room >< therapy room with distant supervision  Skilled Therapeutic Interventions/Progress Updates:   tx 1:Pt resting in bed.  He denies pain. He reported that his wife has hired care givers x 35 hr/week to avoid pt being home along for long periods of time.  Bed mobility to sit up on R side of bed with supervision without cues needed for back precautions.  Slight dizziness reported.  Pt sat x 2 minutes and it resolved.  Sit> stand and short distance gait iwht RW in room to w/c, supervision.  Pt asked to perform peri care as he had a BM at 2am and was not sure he was thorough.  Sit> stand from w/c in BR.  Pt used washcloth thoroughly without LOB, 1 hand on wall bar, and managed clothing with supervision.  Hand washing at w/c level, supervision.  W/c propulsion on level tile for activity tolerance, x 150' with superviison.  Gait training on carpet and home like setting in ADL apt, sit>< stand with RW in low recliner; all with supervision. Pt reported that his recliner is also low.  For safety, pt  needed cues for back precautions to avoid twisting as he turned to throw away paper towel in standing with RW.    Gait training up 2 steps with RW, ascending forward , descending backwards with CGA and VCs for sequencing. Pt reports that his STE home do not have enough level space to  place RW between stairs.   Self stretching in sitting, using foot stool, x 30 seconds x 3 R/L hamstrings and heel cords.  Gait training gym> room, x 180' with [place], [place].  Pt needed cues for route finding.  At end of session, pt resting in w/c with needs at hand and seat belt alarm set.  tx 2;  Pt resting in recliner.  Sit> stand and gait as above, x 180' to/from  [place].  Discussed possible addition of a rail at home's 3 STE.  In stairwell of hallway, using R rail, turned sideways, up/down 2 steps leading with R foot ascending with min assist to boost up onto r foot, L foot descending (backwards) with CGA.  In sitting, using footstool, R/L hamstring/heel cord stretches x 30 seconds x 3, using foot stool.   Seated : 10 x 1 each-- R/L long arc quad knee extension with ankle pumps at end range; bil adductor squeezes, 15 x 1 bil scapular adduction /rowing   holding 3# weighted bar.  Supine: 10 x 1 small excursion bil bridging, 15 x 1 cervical flexion; R/L side lying for L/R clam shells hip abduction.  Pt provided with hand out of exs.  Upon returning to room, pt had emergent, continent BM.  Toilet transfer with [place].  Pt left at end of session, resting in recliner with bil  feet elevated; seat belt alarm set and needs at hand.  Pt needed max cues to remember any of the exercises he had done this session.       Therapy Documentation Precautions:  Precautions Precautions: Fall, Back Precaution Booklet Issued: No Precaution Comments: Reviewed precautions; no brace Restrictions Weight Bearing Restrictions: No        Therapy/Group: Individual Therapy  Jaloni Davoli 01/13/2019, 10:23 AM

## 2019-01-13 NOTE — Progress Notes (Signed)
[facility] Pined[facility] PHYSICAL MEDICINE & REHABILITATION PROGRESS NOTE   Subjective/Complaints: No new issues. Pain controlled. Had a good weekend  ROS: Patient denies fever, rash, sore throat, blurred vision, nausea, vomiting, diarrhea, cough, shortness of breath or chest pain, joint or back pain, headache, or mood change.    Objective:   No results found. No results for input(s): WBC, HGB, HCT, PLT in the last 72 hours. No results for input(s): NA, K, CL, CO2, GLUCOSE, BUN, CREATININE, CALCIUM in the last 72 hours.  Intake/Output Summary (Last 24 hours) at 01/13/2019 1025 Last data filed at 01/13/2019 0900 Gross per 24 hour  Intake 940 ml  Output 225 ml  Net 715 ml     Physical Exam: Vital Signs Blood pressure 127/71, pulse 70, temperature 98.4 F (36.9 C), temperature source Oral, resp. rate 17, height 5\' 10"  (1.778 m), weight 89.1 kg, SpO2 99 %. Constitutional: No distress . Vital signs reviewed. HEENT: EOMI, oral membranes moist Neck: supple Cardiovascular: RRR without murmur. No JVD    Respiratory: CTA Bilaterally without wheezes or rales. Normal effort    GI: BS +, non-tender, non-distended   Musculoskeletal:Normal range of motion.  no foot pain today Neurological: Alert and appropriate.  UE 5/5. LE 3/5 HF, KE and 4/5 ADF/PF. Senses pain and LT in all 4's. DTR's trace to 1+.  Neurological exam is stable today.Skin:  Back incision clean dry and intact diffuse erythema on trunk/limbs.  Skin dry despite applications of lotion.  Psychiatric:  Pleasant. seemed to be generally appropriate.     Assessment/Plan: 1. 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  Physiatrist is providing close team supervision and 24 hour management of active medical problems listed below.  Physiatrist and rehab team continue to assess barriers to  discharge/monitor patient progress toward functional and medical goals  Care Tool:  Bathing    Body parts bathed by patient: Right arm, Left arm, Chest, Abdomen, Front perineal area, Buttocks, Right upper leg, Left upper leg, Face, Right lower leg, Left lower leg   Body parts bathed by helper: Right lower leg, Left lower leg     Bathing assist Assist Level: Contact Guard/Touching assist(using LH sponge)     Upper Body Dressing/Undressing Upper body dressing   What is the patient wearing?: Button up shirt    Upper body assist Assist Level: Supervision/Verbal cueing    Lower Body Dressing/Undressing Lower body dressing      What is the patient wearing?: Pants     Lower body assist Assist for lower body dressing: Minimal Assistance - Patient > 75%     Toileting Toileting    Toileting assist Assist for toileting: Moderate Assistance - Patient 50 - 74% Assistive Device Comment: urinal   Transfers Chair/bed transfer  Transfers assist     Chair/bed transfer assist level: Supervision/Verbal cueing     Locomotion Ambulation   Ambulation assist   Ambulation activity did not occur: Safety/medical concerns(weakness; no +2 available)  Assist level: Supervision/Verbal cueing Assistive device: Walker-rolling Max distance: 180'   Walk 10 feet activity   Assist     Assist level: Supervision/Verbal cueing Assistive device: Walker-rolling   Walk 50 feet activity   Assist    Assist level: Supervision/Verbal cueing Assistive device: Walker-rolling    Walk 150 feet activity   Assist    Assist level: Supervision/Verbal cueing Assistive device: Walker-rolling    Walk 10 feet on uneven  surface  activity   Assist Walk 10 feet on uneven surfaces activity did not occur: Safety/medical concerns(weakness)         Wheelchair     Assist Will patient use wheelchair at discharge?: No Type of Wheelchair: Manual    Wheelchair assist level:  Supervision/Verbal cueing Max wheelchair distance: 150    Wheelchair 50 feet with 2 turns activity    Assist        Assist Level: Supervision/Verbal cueing   Wheelchair 150 feet activity     Assist     Assist Level: Supervision/Verbal cueing   Medical Problem List and Plan: 1.Decreased functional mobilitysecondary to HNP L2-3 with 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 Status post laminotomy L3-4 with microdissection nerve root decompression partial foraminotomies 01/02/2019. No back brace needed. -Continue CIR therapies including PT, OT  2. Antithrombotics: -DVT/anticoagulation: left posterior-tib/gastroc thrombus  -mobilize patient as much as possible  -f/u doppler prior to discharge   -SCD's -antiplatelet therapy: N/A 3. Pain Management:confusion over last 24  -dc oxycodone  -use Ultram as needed, Zanaflex 4 mg 3 times daily as needed.    -Right toe suspicious for gout flare.    -improved after prednisone     4. Mood:Ativan 0.5 mg twice daily -antipsychotic agents: N/A 5. Neuropsych: This patientiscapable of making decisions on hisown behalf. 6. Skin/Wound Care:Routine skin checks 7. Fluids/Electrolytes/Nutrition: encourage PO 8.Hypertension. Lisinopril 10 mg daily, Lopressor 25 mg twice daily.   -Blood pressure under control 6/15 10.Alcohol use.Counseling 11.Constipation. Laxative assistance 12. Psoriasis. Kenalog cream twice daily             -moisturize skin.    LOS: 6 days A FACE TO FACE EVALUATION WAS PERFORMED  Meredith Staggers 01/13/2019, 10:25 AM

## 2019-01-14 ENCOUNTER — Inpatient Hospital Stay (HOSPITAL_COMMUNITY): Payer: Medicare Other

## 2019-01-14 ENCOUNTER — Inpatient Hospital Stay (HOSPITAL_COMMUNITY): Payer: Medicare Other | Admitting: Rehabilitation

## 2019-01-14 ENCOUNTER — Inpatient Hospital Stay (HOSPITAL_COMMUNITY): Payer: Medicare Other | Admitting: Occupational Therapy

## 2019-01-14 DIAGNOSIS — I82402 Acute embolism and thrombosis of unspecified deep veins of left lower extremity: Secondary | ICD-10-CM

## 2019-01-14 NOTE — Plan of Care (Addendum)
See OT POC: Goals upgraded to mod I level to reflect progress and reduced support available at home.

## 2019-01-14 NOTE — Progress Notes (Signed)
Physical Therapy Session Note  Patient Details  Name: Paul Pineda MRN: [id number] Date of Birth: Nov 05, 1945  Today's Date: 01/14/2019 PT Individual Time: 0901-0959 PT Individual Time Calculation (min): 58 min   Short Term Goals: Week 1:  PT Short Term Goal 1 (Week 1): pt will move supine> sit with supervision PT Short Term Goal 2 (Week 1): pt will transfer bed>< w/c with mod assist of 1 person PT Short Term Goal 3 (Week 1): pt will perform gait x 30'  with LRAD and 1 person PT Short Term Goal 4 (Week 1): pt will initiate stair training PT Short Term Goal 5 (Week 1): pt will propel w/c from room >< therapy room with distant supervision  Skilled Therapeutic Interventions/Progress Updates:    Pt seated in w/c upon PT arrival, agreeable to therapy tx and denies pain. Pt transferred to standing with RW and supervision, ambulated x 200 ft to the gym with RW and supervision. Pt participated in berg balance test this session as detailed below, scored 30/56 and discussed these results with the patient. Pt worked on dyanmic standing balance without AD to perform the following: forwards/bakcwards ambulation 3 x 10 ft in each direction, standing on red wedge to perform horseshoe toss, toe taps on aerobic step, lateral toe taps on aerobic step, four square stepping in each direction, all with CGA-min assist for balance. Pt ambulated back to the room with RW and supervision, pt transferred to recliner and left with needs in reach and chair alarm set.   Therapy Documentation Precautions:  Precautions Precautions: Fall, Back Precaution Booklet Issued: No Precaution Comments: Reviewed precautions; no brace Restrictions Weight Bearing Restrictions: No   Balance Balance Assessed: Yes Standardized Balance Assessment Standardized Balance Assessment: Berg Balance Test Berg Balance Test Sit to Stand: Able to stand  independently using hands Standing Unsupported: Able to stand 2 minutes with  supervision Sitting with Back Unsupported but Feet Supported on Floor or Stool: Able to sit safely and securely 2 minutes Stand to Sit: Controls descent by using hands Transfers: Able to transfer safely, definite need of hands Standing Unsupported with Eyes Closed: Able to stand 10 seconds with supervision Standing Ubsupported with Feet Together: Able to place feet together independently and stand for 1 minute with supervision From Standing, Reach Forward with Outstretched Arm: Can reach forward >12 cm safely (5") From Standing Position, Pick up Object from Floor: Unable to try/needs assist to keep balance From Standing Position, Turn to Look Behind Over each Shoulder: Needs assist to keep from losing balance and falling(unable to perform secondary to back precautions) Turn 360 Degrees: Needs assistance while turning Standing Unsupported, Alternately Place Feet on Step/Stool: Able to complete >2 steps/needs minimal assist Standing Unsupported, One Foot in Front: Able to take small step independently and hold 30 seconds Standing on One Leg: Able to lift leg independently and hold equal to or more than 3 seconds Total Score: 30   Therapy/Group: Individual Therapy  Netta Corrigan, PT, DPT 01/14/2019, 7:53 AM

## 2019-01-14 NOTE — Progress Notes (Signed)
Physical Therapy Session Note  Patient Details  Name: Paul Pineda MRN: [id number] Date of Birth: 01-21-1946  Today's Date: 01/14/2019 PT Individual Time: 1410-1505 PT Individual Time Calculation (min): 55 min   Short Term Goals: Week 1:  PT Short Term Goal 1 (Week 1): pt will move supine> sit with supervision PT Short Term Goal 2 (Week 1): pt will transfer bed>< w/c with mod assist of 1 person PT Short Term Goal 3 (Week 1): pt will perform gait x 30'  with LRAD and 1 person PT Short Term Goal 4 (Week 1): pt will initiate stair training PT Short Term Goal 5 (Week 1): pt will propel w/c from room >< therapy room with distant supervision  Skilled Therapeutic Interventions/Progress Updates:   Pt received sitting in recliner, agreeable to therapy.  Reports no pain, just some soreness in low back.  Pt ambulates during session >150' x 2 reps, x 100' x 2 reps with RW at S level with min cues for safe distance from RW and upright posture/forward gaze.  Once in therapy gym, addressed dynamic standing balance with the following: bean bag toss without RW with pt stepping forward x 5 mins before needing seated rest break.  No overt LOB during task.  Progressed to having pt use squat method to reach down to bean bags on 6" step in order to maintain back precautions and strengthen LEs.  Note that pt did have 2-3 mild LOB with min A to correct.  Cues for technique and wide BOS.   In // bars, tandem gait with light single UE support forwards/backwards x 10' with cues for forward gaze/posture, forward/backward marching with emphasis on slow movements to challenge SLS-pt needs intermittent UE support, braiding x 2 reps with light BUE support to work on stepping strategy.  Attempted to work on hip and stepping strategy on balance beam, however pt needing to use restroom urgently.   Assisted to hallway restroom and pt able to do all toileting at S level (ensured pt sitting prior to closing door, PT re-entered upon pt  standing).  Pt noted skin tear from psoriasis, therefore assisted back to room to retrieve bandage.  PT assisted with placing bandage.  Ambulated to day room to perform seated nustep with LEs only x 8 mins at level 3 resistance maintaining rpms 60-70's to address strength and endurance.  Pt ambulated back to room and was left in bed due to getting vascular testing.  Bed alarm set with all needs in reach.   Therapy Documentation Precautions:  Precautions Precautions: Fall, Back Precaution Booklet Issued: No Precaution Comments: Reviewed precautions; no brace Restrictions Weight Bearing Restrictions: No    Therapy/Group: Individual Therapy  Denice Bors 01/14/2019, 3:49 PM

## 2019-01-14 NOTE — Progress Notes (Signed)
Occupational Therapy Session Note  Patient Details  Name: Paul Pineda MRN: [id number] Date of Birth: [birth year]  Today's Date: 01/14/2019 OT Individual Time: 8657-8469 OT Individual Time Calculation (min): 48 min    Short Term Goals: Week 1:  OT Short Term Goal 1 (Week 1): Pt will be able to complete squat pivot to toilet and or BSC with mod A of 1. OT Short Term Goal 2 (Week 1): Pt will be able to doff pants on BSC with min A using lateral leans and back precautions. OT Short Term Goal 3 (Week 1): Pt will be able to don pants on BSC with mod A using lateral leans and back precautions. OT Short Term Goal 4 (Week 1): Pt will be able to self cleanse on BSC with close S.  Skilled Therapeutic Interventions/Progress Updates:    Pt greeted semi-reclined in bed, chuck pad covered in blood from pt scratching skin. Pt agreeable to showering this morning. Stand-pivot bed>wc>shower bench with close supervision. Pt able to recall 2/3 back precautions and needed cues throughout session not to bend over and use figure 4 position instead to wash feet or don pants. Pt had scratched his back and bottom so bad, it was still bleeding. Nursing entered and provided medications and instructed OT on which creams to use for skin irritation. Pt needed overall supervision for BADL tasks today. Pt ambulated to recliner at end of session w/ RW and CGA. Pt left seated in recliner with chair alarm on and needs met.   Therapy Documentation Precautions:  Precautions Precautions: Fall, Back Precaution Booklet Issued: No Precaution Comments: Reviewed precautions; no brace Restrictions Weight Bearing Restrictions: No Pain:  denies pain  Therapy/Group: Individual Therapy  Valma Cava 01/14/2019, 7:53 AM

## 2019-01-14 NOTE — Progress Notes (Signed)
LLE venous duplex       has been completed. Preliminary results can be found under CV proc through chart review. Clorinda Wyble, BS, RDMS, RVT    

## 2019-01-14 NOTE — Progress Notes (Signed)
Physical Therapy Session Note  Patient Details  Name: Paul Pineda MRN: [id number] Date of Birth: 1946/02/05  Today's Date: 01/14/2019 PT Individual Time: 1050-1135 PT Individual Time Calculation (min): 45 min   Short Term Goals: Week 1:  PT Short Term Goal 1 (Week 1): pt will move supine> sit with supervision PT Short Term Goal 2 (Week 1): pt will transfer bed>< w/c with mod assist of 1 person PT Short Term Goal 3 (Week 1): pt will perform gait x 30'  with LRAD and 1 person PT Short Term Goal 4 (Week 1): pt will initiate stair training PT Short Term Goal 5 (Week 1): pt will propel w/c from room >< therapy room with distant supervision  Skilled Therapeutic Interventions/Progress Updates:     Patient in recliner in room upon PT arrival. Patient alert and agreeable to PT session.  Therapeutic Activity: Transfers: Patient performed sit to/from stand x3 with supervision for safety. Provided verbal cues for bending at the hips with a flat back to maintain spinal precautions and reaching back to sit. He performed a floor transfer x2 with CGA. PT educated on fall recovery, having someone with him when he gets up from the floor, inspecting for injury, and calling emergency personnel in the event of an injury or if he is home alone. Provided demonstration and cues for proper body mechanics to maintain spinal precautions during transfers.   Gait Training:  Patient ambulated 150 feet x2 using RW with supervision for safety. Ambulated with decreased gait speed, decreased step length, increased hip and knee flexion, forward trunk lean, and downward head gaze. Provided verbal cues for erect posture to maintain back precautions, looking ahead, and increased gait speed. He went up/down 4 6" steps using a RW on an angle going up and down forward leading with the R LE while ascending and L LE while descending with CGA for safety and balance. Provided demonstration and cues for technique and management of RW.  Patient has 2 7" steps without rails at home and reports that he did this technique to manage them PTA.  Patient in recliner in room at end of session with breaks locked, chair alarm set, and all needs within reach. Patient expressed that he would like to be home by Sunday for Father's day, PT informed him that his rehab team would be determining his d/c date today in conference.   Therapy Documentation Precautions:  Precautions Precautions: Fall, Back Precaution Booklet Issued: No Precaution Comments: Reviewed precautions; no brace Restrictions Weight Bearing Restrictions: No Pain: Patient denied pain throughout session.    Therapy/Group: Individual Therapy  Shreya Lacasse L Kallan Merrick PT, DPT  01/14/2019, 12:59 PM

## 2019-01-14 NOTE — Progress Notes (Signed)
Nutrition Follow-up  RD working remotely.  DOCUMENTATION CODES:  Not applicable  INTERVENTION:   - Continue snacks TID between meals  - Continue MVI with minerals daily  NUTRITION DIAGNOSIS:  Increased nutrient needs related to post-op healing as evidenced by estimated needs.  Ongoing, being addressed with addition of snacks  GOAL:   Patient will meet greater than or equal to 90% of their needs  Progressing  MONITOR:   PO intake, Supplement acceptance, Labs, Weight trends, Skin, I & O's  REASON FOR ASSESSMENT:   Malnutrition Screening Tool    ASSESSMENT:   Paul Pineda is a 73 year old right-handed male history of hypertension and alcohol use as reported by wife.  Per chart review patient lives with spouse and had been receiving home health therapies for decreased mobility related to back pain.  Presented 01/02/2019 with low back pain radiating to left lower extremity x2 weeks.  Denied bowel or bladder disturbances.  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  Underwent sublaminar R decompressive laminectomy L2-3 with left side microdiscectomy of L3 nerve root with partial medial facetectomy and foraminotomy 01/02/2019 per Dr. Saintclair Halsted.  No brace needed.  Hospital course pain management.  Therapy evaluations completed patient was admitted for a comprehensive rehab program.  Weight up 6 lbs since admission to CIR.  Spoke with pt via phone call to room. Pt in good spirits and states he has "no complaints" with his meals. Pt reports "some of the meals have even been outstanding!"  Pt reports he has been eating his snacks. Pt states, "I normally don't like yogurt but have been enjoying the [place] yogurt and fruit which is surprising!" Pt states he is now a [place] yogurt fan.  When asked if his appetite remains good, pt states "oh gosh yes!" Pt states that he is eating "everything you throw at me."  Pt denies any  nutrition-related concerns at this time. Will continue with current plan of care.  Meal Completion: 50-100% x last 8 recorded meals (averaging 84%)  Medications reviewed and include: MVI with minerals, Protonix, Senna  Labs reviewed.  UOP: 900 ml + 1 unmeasured occurrence x 24 hours  Diet Order:   Diet Order            Diet regular Room service appropriate? Yes; Fluid consistency: Thin  Diet effective now              EDUCATION NEEDS:   Education needs have been addressed  Skin:  Skin Assessment: Skin Integrity Issues: Incisions: medial lumbar  Last BM:  01/13/19  Height:   Ht Readings from Last 1 Encounters:  01/07/19 5\' 10"  (1.778 m)    Weight:   Wt Readings from Last 1 Encounters:  01/13/19 89.1 kg    Ideal Body Weight:  75.5 kg  BMI:  Body mass index is 28.18 kg/m.  Estimated Nutritional Needs:   Kcal:  2050-2250  Protein:  105-120 grams  Fluid:  > 2 L    Gaynell Face, MS, RD, LDN Inpatient Clinical Dietitian Pager: [phone] Weekend/After Hours: [phone]

## 2019-01-14 NOTE — Plan of Care (Signed)
Problem: RH Wheelchair Mobility Goal: LTG Patient will propel w/c in controlled environment (PT) Description: LTG: Patient will propel wheelchair in controlled environment, # of feet with assist (PT) Outcome: Not Applicable Flowsheets (Taken 01/14/2019 1301) LTG: Pt will propel w/c in controlled environ  assist needed:: (No longer applicable) -- Note: Patient is ambulatory >150' and will not need a w/c at d/c.   Problem: RH Wheelchair Mobility Goal: LTG Patient will propel w/c in home environment (PT) Description: LTG: Patient will propel wheelchair in home environment, # of feet with assistance (PT). Outcome: Not Applicable Flowsheets (Taken 01/14/2019 1301) LTG: Pt will propel w/c in home environ  assist needed:: (No longer applicable) -- Note: Patient is at ambulatory level  with goals for mod I and will not use a w/c at d/c.    Problem: RH Balance Goal: LTG Patient will maintain dynamic sitting balance (PT) Description: LTG:  Patient will maintain dynamic sitting balance with assistance during mobility activities (PT) Flowsheets (Taken 01/14/2019 1301) LTG: Pt will maintain dynamic sitting balance during mobility activities with:: (upgrade goal) Independent Note: Has made good progress with improved balance and mobility. Will be home alone during the day and needs to be mod I level at d/c for safety. Goal: LTG Patient will maintain dynamic standing balance (PT) Description: LTG:  Patient will maintain dynamic standing balance with assistance during mobility activities (PT) Flowsheets (Taken 01/14/2019 1301) LTG: Pt will maintain dynamic standing balance during mobility activities with:: (Upgrade goal) Independent with assistive device  Note: Has made good progress with improved balance and mobility. Will be home alone during the day and needs to be mod I level at d/c for safety.   Problem: RH Bed Mobility Goal: LTG Patient will perform bed mobility with assist (PT) Description: LTG:  Patient will perform bed mobility with assistance, with/without cues (PT). Flowsheets (Taken 01/14/2019 1301) LTG: Pt will perform bed mobility with assistance level of: (upgrade goal) Independent with assistive device  Note: Has made good progress with improved balance and mobility. Will be home alone during the day and needs to be mod I level at d/c for safety.   Problem: RH Bed to Chair Transfers Goal: LTG Patient will perform bed/chair transfers w/assist (PT) Description: LTG: Patient will perform bed to chair transfers with assistance (PT). Flowsheets (Taken 01/14/2019 1301) LTG: Pt will perform Bed to Chair Transfers with assistance level: (upgrade goal) Independent with assistive device  Note: Has made good progress with improved balance and mobility. Will be home alone during the day and needs to be mod I level at d/c for safety.   Problem: RH Furniture Transfers Goal: LTG Patient will perform furniture transfers w/assist (OT/PT) Description: LTG: Patient will perform furniture transfers  with assistance (OT/PT). Flowsheets (Taken 01/14/2019 1301) LTG: Pt will perform furniture transfers with assist:: (upgrade goal) Independent with assistive device  Note: Has made good progress with improved balance and mobility. Will be home alone during the day and needs to be mod I level at d/c for safety.   Problem: RH Ambulation Goal: LTG Patient will ambulate in controlled environment (PT) Description: LTG: Patient will ambulate in a controlled environment, # of feet with assistance (PT). Flowsheets (Taken 01/14/2019 1301) LTG: Pt will ambulate in controlled environ  assist needed:: (upgrade goal) Independent with assistive device LTG: Ambulation distance in controlled environment: 150 Note: Has made good progress with improved balance and mobility. Will be home alone during the day and needs to be mod I level at d/c for  safety. Goal: LTG Patient will ambulate in home environment  (PT) Description: LTG: Patient will ambulate in home environment, # of feet with assistance (PT). Flowsheets (Taken 01/14/2019 1301) LTG: Pt will ambulate in home environ  assist needed:: (upgrade goal) Independent with assistive device LTG: Ambulation distance in home environment: 50 Note: Has made good progress with improved balance and mobility. Will be home alone during the day and needs to be mod I level at d/c for safety.

## 2019-01-14 NOTE — Progress Notes (Signed)
[facility] PHYSICAL MEDICINE & REHABILITATION PROGRESS NOTE   Subjective/Complaints: No new issues. Pleased with progress. Anxious to get home!  ROS: Patient denies fever, rash, sore throat, blurred vision, nausea, vomiting, diarrhea, cough, shortness of breath or chest pain,   headache, or mood change.    Objective:   No results found. No results for input(s): WBC, HGB, HCT, PLT in the last 72 hours. No results for input(s): NA, K, CL, CO2, GLUCOSE, BUN, CREATININE, CALCIUM in the last 72 hours.  Intake/Output Summary (Last 24 hours) at 01/14/2019 1212 Last data filed at 01/14/2019 0851 Gross per 24 hour  Intake 476 ml  Output 900 ml  Net -424 ml     Physical Exam: Vital Signs Blood pressure (!) 116/51, pulse 75, temperature 98.3 F (36.8 C), temperature source Oral, resp. rate 16, height 5\' 10"  (1.778 m), weight 89.1 kg, SpO2 96 %. Constitutional: No distress . Vital signs reviewed. HEENT: EOMI, oral membranes moist Neck: supple Cardiovascular: RRR without murmur. No JVD    Respiratory: CTA Bilaterally without wheezes or rales. Normal effort    GI: BS +, non-tender, non-distended  Musculoskeletal:Normal range of motion.  no foot pain today Neurological: Alert and appropriate.  UE 5/5. LE 3/5 HF, KE and 4/5 ADF/PF. Senses pain and LT in all 4's. DTR's trace to 1+.  Neurological exam is stable today.Skin:  Back incision clean dry intact..  Skin dry, red  Psychiatric:  Pleasant. seemed to be generally appropriate.     Assessment/Plan: 1. 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  Physiatrist is providing close team supervision and 24 hour management of active medical problems listed below.  Physiatrist and rehab team continue to assess barriers to discharge/monitor patient progress toward functional and medical goals  Care  Tool:  Bathing    Body parts bathed by patient: Right arm, Left arm, Chest, Abdomen, Front perineal area, Buttocks, Right upper leg, Left upper leg, Face, Right lower leg, Left lower leg   Body parts bathed by helper: Right lower leg, Left lower leg     Bathing assist Assist Level: Contact Guard/Touching assist     Upper Body Dressing/Undressing Upper body dressing   What is the patient wearing?: Button up shirt    Upper body assist Assist Level: Supervision/Verbal cueing    Lower Body Dressing/Undressing Lower body dressing      What is the patient wearing?: Pants     Lower body assist Assist for lower body dressing: Contact Guard/Touching assist     Toileting Toileting    Toileting assist Assist for toileting: Contact Guard/Touching assist Assistive Device Comment: urinal   Transfers Chair/bed transfer  Transfers assist     Chair/bed transfer assist level: Supervision/Verbal cueing     Locomotion Ambulation   Ambulation assist   Ambulation activity did not occur: Safety/medical concerns(weakness; no +2 available)  Assist level: Supervision/Verbal cueing Assistive device: Walker-rolling Max distance: 200 ft   Walk 10 feet activity   Assist     Assist level: Supervision/Verbal cueing Assistive device: Walker-rolling   Walk 50 feet activity   Assist    Assist level: Supervision/Verbal cueing Assistive device: Walker-rolling    Walk 150 feet activity   Assist    Assist level: Supervision/Verbal cueing Assistive device: Walker-rolling    Walk 10 feet on uneven surface  activity   Assist Walk 10 feet on uneven surfaces activity did not occur: Safety/medical  concerns(weakness)         Wheelchair     Assist Will patient use wheelchair at discharge?: No Type of Wheelchair: Manual    Wheelchair assist level: Supervision/Verbal cueing Max wheelchair distance: 150    Wheelchair 50 feet with 2 turns  activity    Assist        Assist Level: Supervision/Verbal cueing   Wheelchair 150 feet activity     Assist     Assist Level: Supervision/Verbal cueing   Medical Problem List and Plan: 1.Decreased functional mobilitysecondary to HNP L2-3 with 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 Status post laminotomy L3-4 with microdissection nerve root decompression partial foraminotomies 01/02/2019. No back brace needed. -Continue CIR therapies including PT, OT  -team conference today  2. Antithrombotics: -DVT/anticoagulation: left posterior-tib/gastroc thrombus  -mobilize patient as much as possible  -recheck dopplers    -SCD's -antiplatelet therapy: N/A 3. Pain Management:confusion over last 24  -dc oxycodone  -use Ultram as needed, Zanaflex 4 mg 3 times daily as needed.    -Right toe   gout flare.    -improved after prednisone     4. Mood:Ativan 0.5 mg twice daily -antipsychotic agents: N/A 5. Neuropsych: This patientiscapable of making decisions on hisown behalf. 6. Skin/Wound Care:Routine skin checks 7. Fluids/Electrolytes/Nutrition: encourage PO 8.Hypertension. Lisinopril 10 mg daily, Lopressor 25 mg twice daily.   -Blood pressure under control 6/16 10.Alcohol use.Counseling 11.Constipation. Laxative assistance 12. Psoriasis. Kenalog cream twice daily             -moisturize skin.    LOS: 7 days A FACE TO FACE EVALUATION WAS PERFORMED  Meredith Staggers 01/14/2019, 12:12 PM

## 2019-01-15 ENCOUNTER — Inpatient Hospital Stay (HOSPITAL_COMMUNITY): Payer: Medicare Other | Admitting: Occupational Therapy

## 2019-01-15 ENCOUNTER — Inpatient Hospital Stay (HOSPITAL_COMMUNITY): Payer: Medicare Other

## 2019-01-15 NOTE — Progress Notes (Signed)
Physical Therapy Session Note  Patient Details  Name: Paul Pineda MRN: [id number] Date of Birth: [birth year]  Today's Date: 01/15/2019 PT Individual Time: 1330-1450 PT Individual Time Calculation (min): 80 min   Short Term Goals: Week 1:  PT Short Term Goal 1 (Week 1): pt will move supine> sit with supervision PT Short Term Goal 2 (Week 1): pt will transfer bed>< w/c with mod assist of 1 person PT Short Term Goal 3 (Week 1): pt will perform gait x 30'  with LRAD and 1 person PT Short Term Goal 4 (Week 1): pt will initiate stair training PT Short Term Goal 5 (Week 1): pt will propel w/c from room >< therapy room with distant supervision   Pt to discharge 01/17/19; LTGS upgraded to modified independent basic transfers and gait x 150' iwht LRAD; supervision car transfer, up/down 4 steps bil rails and up/down 2 steps with LRAD.      Skilled Therapeutic Interventions/Progress Updates:    Pt resting in recliner.  Sit> stand from recliner with Nenzel.  Gait training iwht RW on unit with supervision, x 180', x 150', .  As he sat down in armless chair, pt needed cue to avoid trunk rotation.  Reviewed HEP; pt recognized 2/3 exercises that he has done previously.  Max cues for safe performance within spinal precautions.  Pt did not remember parameters of stretching.  Seated R/L  Hamstrings/heel cord stretch x 30 seconds x 3, using footstool.   Using hand out, pt performed 10 x 1 mini bridges in hook lying; 10 x 1 R/L hip abduction with flexed knees and hips.  PT cued pt to count aloud to avoid Valsalva maneuver.    Up/down 7" platform with 6" platform on top of it, to simulate home entry. Pt has 2 steps, then short distance before threshold.  Pt remembered to lead up with R foot and down with L foot.  Cues for sequencing and CGA. He descended backwards safely.  Pt reported he needed to urinate, urgently.  Gait to return to room with close supervision, RW.  Pt stood to urinate continently at  toilet. Hand washing at sink, supervision.   Seated bil hip adductor squeezes 2 x 10; standing with bil hands on counter: 1 x 10 mini squats, heel/toe raises. With 3# weighted bar, 2 x 15 bil UE rowing; using 3# bar, pt tapped beach ball x 1 minute.  He recognized spinal precautions as he refused to attempt tapping it when PT stood to his side which would have resulted in trunk rotation.  Chest passes with beach ball x 1 minute.  Pt placed tote bag on pt's CIR RW; HEP placed in tote bat.  Pt resting in recliner with needs at hand and seat alarm set at end of session.  Therapy Documentation Precautions:  Precautions Precautions: Fall, Back Precaution Booklet Issued: No Precaution Comments: Reviewed precautions; no brace Restrictions Weight Bearing Restrictions: No Pain: 1/10 surgical, low back         Therapy/Group: Individual Therapy  Thamara Leger 01/15/2019, 4:37 PM

## 2019-01-15 NOTE — Discharge Summary (Signed)
Physician Discharge Summary  Patient ID: Paul Pineda MRN: [id number] DOB/AGE: 71-May-1947 71 y.o.  Admit date: 01/07/2019 Discharge date: 01/17/2019  Discharge Diagnoses:  Active Problems:   Lumbar radiculopathy Left posterior tibial gastrocnemius thrombus Pain management Mood Hypertension Alcohol use Severe psoriasis  Discharged Condition: Stable  Significant Diagnostic Studies: Dg Lumbar Spine 2-3 Views  Result Date: 01/02/2019 CLINICAL DATA:  Micro discectomy. EXAM: LUMBAR SPINE - 2-3 VIEW COMPARISON:  MRI 12/29/2018. FINDINGS: Lumbar spine numbered as per prior MRI. Metallic marker noted posteriorly at the level of L3. Diffuse multilevel degenerative change. No acute abnormality identified. IMPRESSION: Metallic marker noted posteriorly at the level of L3. Electronically Signed   By: Marcello Moores  Register   On: 01/02/2019 15:36   Ct Head Wo Contrast  Result Date: 12/22/2018 CLINICAL DATA:  Pain following several recent falls EXAM: CT HEAD WITHOUT CONTRAST TECHNIQUE: Contiguous axial images were obtained from the base of the skull through the vertex without intravenous contrast. COMPARISON:  Dec 15, 2017 brain MRI FINDINGS: Brain: There is stable mild diffuse atrophy. There is no intracranial mass, hemorrhage, extra-axial fluid collection, or midline shift. The brain parenchyma appears unremarkable. There is no evident acute infarct. Vascular: There is no hyperdense vessel. There is calcification in each carotid siphon region. Skull: The bony calvarium appears intact. Sinuses/Orbits: There is mild mucosal thickening in several ethmoid air cells. Other visualized paranasal sinuses are clear. There is a bony spur extending toward the right from the posterior nasal septum. Orbits appear symmetric bilaterally. There is evidence of previous cataract removal on the right. Other: Mastoid air cells are clear. IMPRESSION: Mild diffuse atrophy. Brain parenchyma appears unremarkable. No mass or hemorrhage.  There are foci of arterial vascular calcification. There is mild mucosal thickening in several ethmoid air cells. Electronically Signed   By: Lowella Grip III M.D.   On: 12/22/2018 13:25   Mr Lumbar Spine Wo Contrast  Result Date: 12/29/2018 CLINICAL DATA:  Patient status post fall 1 week ago. Low back pain. Initial encounter. EXAM: MRI LUMBAR SPINE WITHOUT CONTRAST TECHNIQUE: Multiplanar, multisequence MR imaging of the lumbar spine was performed. No intravenous contrast was administered. COMPARISON:  CT chest, abdomen and pelvis 10/08/2018. FINDINGS: Segmentation:  Standard. Alignment:  Maintained with straightening of lordosis noted. Vertebrae: The patient has a congenitally narrow central spinal canal. Marked degenerative endplate signal change is seen from L3-S1. No fracture or worrisome lesion. Conus medullaris and cauda equina: Conus extends to the L1 level. Conus and cauda equina appear normal. Paraspinal and other soft tissues: Negative. Disc levels: T11-12 and T12-L1 are imaged in the sagittal plane only and negative. L1-2: There is a shallow disc bulge more prominent to the right. No stenosis. L2-3: Loss of disc space height. Large broad-based central and right paracentral protrusion and mild-to-moderate facet arthropathy are seen. There is severe central canal and bilateral subarticular recess narrowing. The foramina remain open. L3-4: Moderate facet degenerative change is worse on the left. There is some ligamentum flavum thickening. Loss of disc space height with a disc bulge and endplate spur eccentric to the left. There is narrowing in the left subarticular recess with impingement on the descending left L4 root. Mild to moderate central canal stenosis is present overall. Right foramen is open. Mild left foraminal narrowing noted. L4-5: Broad-based disc bulge with endplate spur. There is only mild central canal stenosis but narrowing in the subarticular recesses and impingement on the  descending L5 roots is worse on the right. Mild to moderate left foraminal  narrowing is present. The right foramen is open. L5-S1: Broad-based central protrusion and endplate spur are eccentric to the right. Right worse than left subarticular recess narrowing is present and could impact the S1 roots. Moderate to moderately severe foraminal narrowing is also worse on the right. IMPRESSION: No acute abnormality. Congenitally narrow central canal. Severe central canal and bilateral subarticular recess narrowing at L2-3. Narrowing in the left subarticular recess at L3-4 with impingement on the descending left L4 root. There is mild to moderate central canal stenosis overall at L3-4. Right worse than left subarticular recess narrowing at L4-5 with impingement on the descending L5 roots, particularly the right L5 root. Right worse than left subarticular recess narrowing at L5-S1 with impingement on the descending S1 roots, particularly the right S1 root. Moderate to moderately severe foraminal narrowing at this level is worse on the right. Electronically Signed   By: Inge Rise M.D.   On: 12/29/2018 13:45   Dg Knee Complete 4 Views Left  Result Date: 12/22/2018 CLINICAL DATA:  Fall, pain EXAM: LEFT KNEE - COMPLETE 4+ VIEW; RIGHT KNEE - COMPLETE 4+ VIEW COMPARISON:  None. FINDINGS: No fracture or dislocation of the bilateral knees. 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 There is generally mild tricompartmental arthrosis of the left knee. No significant knee joint effusion. IMPRESSION: No fracture or dislocation of the bilateral knees. 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 There is generally mild tricompartmental arthrosis of the left knee. No significant knee joint effusion. Electronically Signed   By: Eddie Candle M.D.   On: 12/22/2018 13:23   Dg Knee Complete 4 Views Right  Result Date: 12/22/2018 CLINICAL DATA:   Fall, pain EXAM: LEFT KNEE - COMPLETE 4+ VIEW; RIGHT KNEE - COMPLETE 4+ VIEW COMPARISON:  None. FINDINGS: No fracture or dislocation of the bilateral knees. 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 There is generally mild tricompartmental arthrosis of the left knee. No significant knee joint effusion. IMPRESSION: No fracture or dislocation of the bilateral knees. 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 There is generally mild tricompartmental arthrosis of the left knee. No significant knee joint effusion. Electronically Signed   By: Eddie Candle M.D.   On: 12/22/2018 13:23   Vas [place] Lower Extremity Venous (dvt)  Result Date: 01/14/2019  Lower Venous Study Indications: Follow up DVT from 5 days prior: left gastroc and post tib veins.  Comparison Study: 01/09/19 Performing Technologist: June Leap RDMS, RVT  Examination Guidelines: A complete evaluation includes B-mode imaging, spectral Doppler, color Doppler, and power Doppler as needed of all accessible portions of each vessel. Bilateral testing is considered an integral part of a complete examination. Limited examinations for reoccurring indications may be performed as noted.  +-----+---------------+---------+-----------+----------+-------+ RIGHTCompressibilityPhasicitySpontaneityPropertiesSummary +-----+---------------+---------+-----------+----------+-------+ CFV  Full           Yes      Yes                          +-----+---------------+---------+-----------+----------+-------+   +---------+---------------+---------+-----------+----------+-------+ LEFT     CompressibilityPhasicitySpontaneityPropertiesSummary +---------+---------------+---------+-----------+----------+-------+ CFV      Full           Yes      Yes                          +---------+---------------+---------+-----------+----------+-------+ SFJ      Full                                                  +---------+---------------+---------+-----------+----------+-------+  FV Prox  Full                                                 +---------+---------------+---------+-----------+----------+-------+ FV Mid   Full                                                 +---------+---------------+---------+-----------+----------+-------+ FV DistalFull                                                 +---------+---------------+---------+-----------+----------+-------+ PFV      Full                                                 +---------+---------------+---------+-----------+----------+-------+ POP      Full           Yes      Yes                          +---------+---------------+---------+-----------+----------+-------+ PTV      None                                                 +---------+---------------+---------+-----------+----------+-------+ PERO     Full                                                 +---------+---------------+---------+-----------+----------+-------+ Gastroc  None                                                 +---------+---------------+---------+-----------+----------+-------+     Summary: Right: No evidence of common femoral vein obstruction. Left: Findings consistent with continued deep vein thrombosis involving the left posterior tibial vein, and left gastocnemius vein.  *See table(s) above for measurements and observations. Electronically signed by Deitra Mayo MD on 01/14/2019 at 3:40:47 PM.    Final    Vas [place] Lower Extremity Venous (dvt)  Result Date: 01/09/2019  Lower Venous Study Indications: Swelling.  Comparison Study: No prior study. Performing Technologist: Maudry Mayhew, MHA, RDMS, RVT, RDCS  Examination Guidelines: A complete evaluation includes B-mode imaging, spectral Doppler, color Doppler, and power Doppler as needed of all accessible portions of each vessel. Bilateral testing is  considered an integral part of a complete examination. Limited examinations for reoccurring indications may be performed as noted.  +---------+---------------+---------+-----------+----------+--------------+ RIGHT    CompressibilityPhasicitySpontaneityPropertiesSummary        +---------+---------------+---------+-----------+----------+--------------+ CFV      Full           No  Yes                  Pulsatile flow +---------+---------------+---------+-----------+----------+--------------+ SFJ      Full                                                        +---------+---------------+---------+-----------+----------+--------------+ FV Prox  Full                                                        +---------+---------------+---------+-----------+----------+--------------+ FV Mid   Full                                                        +---------+---------------+---------+-----------+----------+--------------+ FV DistalFull                                                        +---------+---------------+---------+-----------+----------+--------------+ PFV      Full                                                        +---------+---------------+---------+-----------+----------+--------------+ POP      Full           No       Yes                  Pulsatile flow +---------+---------------+---------+-----------+----------+--------------+ PTV      Full                                                        +---------+---------------+---------+-----------+----------+--------------+ PERO     Full                                                        +---------+---------------+---------+-----------+----------+--------------+   +---------+---------------+---------+-----------+----------+--------------+ LEFT     CompressibilityPhasicitySpontaneityPropertiesSummary         +---------+---------------+---------+-----------+----------+--------------+ CFV      Full           No       Yes                  Pulsatile flow +---------+---------------+---------+-----------+----------+--------------+ SFJ      Full                                                        +---------+---------------+---------+-----------+----------+--------------+  FV Prox  Full                                                        +---------+---------------+---------+-----------+----------+--------------+ FV Mid   Full                                                        +---------+---------------+---------+-----------+----------+--------------+ FV DistalFull                                                        +---------+---------------+---------+-----------+----------+--------------+ PFV      Full                                                        +---------+---------------+---------+-----------+----------+--------------+ POP      Full           No       Yes                  Pulsatile flow +---------+---------------+---------+-----------+----------+--------------+ PTV      None                    No                   Acute          +---------+---------------+---------+-----------+----------+--------------+ PERO     Full                                                        +---------+---------------+---------+-----------+----------+--------------+ Gastroc  None                    No                   Acute          +---------+---------------+---------+-----------+----------+--------------+     Summary: Right: There is no evidence of deep vein thrombosis in the lower extremity. No cystic structure found in the popliteal fossa. Left: Findings consistent with acute deep vein thrombosis involving the left posterior tibial vein, and left gastrocnemius vein. No cystic structure found in the popliteal fossa.  Bilateral lower extremity veins  exhibit pulsatile flow, suggestive of possibly elevated right-sided heart pressure. *See table(s) above for measurements and observations. Electronically signed by Servando Snare MD on 01/09/2019 at 5:29:45 PM.    Final     Labs:  Basic Metabolic Panel: No results for input(s): NA, K, CL, CO2, GLUCOSE, BUN, CREATININE, CALCIUM, MG, PHOS in the last 168 hours.  CBC: No results for input(s): WBC, NEUTROABS, HGB, HCT, MCV, PLT in the last 168 hours.  CBG: No results for input(s): GLUCAP in  the last 168 hours.  Family history.  Father with brain cancer Sister with psoriasis Brother with psoriasis.  Denies any colon cancer  Brief HPI: Paul Pineda is a 73 year old right-handed male history of hypertension as well as alcohol use.  Lives with spouse and had been receiving home health therapies for decreased mobility related to back pain.  Presented 01/02/2019 with low back pain radiating to lower extremities x2 weeks.  Denied bowel or bladder disturbances.  X-rays and imaging revealed herniated nucleus pulposus L2-3 with lumbar stenosis L3-4 and radiculopathy.  Underwent sublaminar right decompressive laminectomy L2-3 with left side microdiscectomy of nerve root and foraminotomies 01/02/2019 per Dr. Saintclair Halsted.  No back brace needed.  Hospital course pain management.  Patient was admitted for a comprehensive rehab program.     Hospital Course: ZAIDYN CLAIRE was admitted to rehab 01/07/2019 for inpatient therapies to consist of PT, ST and OT at least three hours five days a week. Past admission physiatrist, therapy team and rehab RN have worked together to provide customized collaborative inpatient rehab.  Pertaining to patient lumbar stenosis with radiculopathy had undergone laminotomy decompression 01/02/2019.  No back brace required he would follow-up neurosurgery surgical site healing nicely.  Vascular study showed left posterior tibial gastroc thrombosis with follow-up studies completed for any propagation of DVT  that remain unchanged.  Patient asymptomatic.  Pain managed with the use of Ultram as needed as well as Zanaflex as needed.  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  Blood pressure controlled on lisinopril and Lopressor he would follow-up with his primary MD.  Noted history of alcohol use he received full count regards to cessation of alcohol products.  Patient with extreme psoriasis Kenalog cream twice daily and follow-up outpatient.  Physical exam.  Blood pressure 102/50 pulse 85 temperature 99 respirations 18 oxygen saturations 96% room air Constitutional.  Well-developed no distress HEENT.  Normocephalic and atraumatic Eyes.  Pupils round and reactive to light no discharge without nystagmus Neck.  Supple nontender normal range of motion no thyromegaly Cardiovascular.  Normal rate and rhythm no murmur heard Respiratory effort normal no respiratory distress without wheezes GI.  Soft exhibits no distention nontender without rebound Neurological.  Alert follows commands oriented upper extremities 5 out of 5 lower extremities 3 out of 5 hip flexors, knee extension and 4 out of 5 ankle dorsi plantarflexion. Back incision dressed skin with diffuse erythema and severe flaking  Rehab course: During patient's stay in rehab weekly team conferences were held to monitor patient's progress, set goals and discuss barriers to discharge. At admission, patient required minimal assist ambulate 20 feet rolling walker, moderate assist lateral scoot transfers, minimal assist squat pivot transfers, total assist stand pivot transfers.  Moderate assist upper body bathing moderate assist lower body bathing minimal assist lower body dressing set up for upper body dressing moderate assist toilet transfers  He  has had improvement in activity tolerance, balance, postural control as well as ability to compensate for deficits. He has had improvement in functional use RUE/LUE  and RLE/LLE as  well as improvement in awareness.  Patient transferred to standing with rolling walker and supervision ambulates 200 feet to the gym with rolling walker and supervision participated in [place] balance testing 30 out of 56 discussed these results with patient and family and his need for supervision for safety.  He can gather his belongings for activities daily living and homemaking.  He was advised no driving.  Full family teaching completed plan discharge to home       Disposition: Discharge disposition: 01-Home or Self Care     Discharged home   Diet: Regular  Special Instructions: No driving, smoking or alcohol  Out patient vascular study follow up doppler arranged  Medications at discharge. 1.  Tylenol as needed 2.  Sarna lotion as needed 3.  Eucerin cream twice daily 4.  Hydrocortisone cream 1% 3 times daily 5.  Atarax 25 mg every 8 hours as needed itching 6.  Lisinopril 10 mg p.o. daily 7.  Ativan 0.5 mg p.o. twice daily 8.  Lopressor 25 mg p.o. twice daily 9.  Multivitamin daily 10.  Protonix 40 mg daily 11.  Senokot 1 tablet daily 12.  Zanaflex 4 mg 3 times daily as needed 13.  Tramadol 50 to 100 mg every 6 hours as needed pain    Follow-up Information    Meredith Staggers, MD Follow up.   Specialty: Physical Medicine and Rehabilitation Why: only as needed Contact information: [address] [place] [address] [phone]        Kary Kos, MD Follow up.   Specialty: Neurosurgery Why: Call for appointment Contact information: [address] [place] [address] [phone]           Signed: Lavon Paganini [place] 01/17/2019, 6:08 AM

## 2019-01-15 NOTE — Progress Notes (Signed)
Occupational Therapy Session Note  Patient Details  Name: Paul Pineda MRN: [id number] Date of Birth: 02/14/1946  Today's Date: 01/15/2019 OT Individual Time: 8676-1950 OT Individual Time Calculation (min): 41 min    Short Term Goals: Week 1:  OT Short Term Goal 1 (Week 1): Pt will be able to complete squat pivot to toilet and or BSC with mod A of 1. OT Short Term Goal 2 (Week 1): Pt will be able to doff pants on BSC with min A using lateral leans and back precautions. OT Short Term Goal 3 (Week 1): Pt will be able to don pants on BSC with mod A using lateral leans and back precautions. OT Short Term Goal 4 (Week 1): Pt will be able to self cleanse on BSC with close S.  Skilled Therapeutic Interventions/Progress Updates:    Upon entering the room, pt seated in recliner chair with no c/o pain and agreeable to OT intervention. Pt ambulating with RW and to vending machine. Pt making selections and needing min cuing for squatting to obtain items from vending machine. Pt not following cuing and breaking back precautions to obtain items. Pt returning to room in same manner as above while OT provided education regarding importance of following precautions and what could happen if they continued to be broken. Pt verbalized understanding. Pt also reporting fatigue. Paper handouts provided with information regarding energy conservation for self care tasks and when in the community. Pt was active participation in discussion and verbalized understanding. Pt remained in recliner chair with chair alarm activated and call bell within reach.   Therapy Documentation Precautions:  Precautions Precautions: Fall, Back Precaution Booklet Issued: No Precaution Comments: Reviewed precautions; no brace Restrictions Weight Bearing Restrictions: No General:   Vital Signs: Therapy Vitals Temp: 97.8 F (36.6 C) Pulse Rate: 86 Resp: 18 BP: 116/68 Patient Position (if appropriate): Sitting Oxygen  Therapy SpO2: 97 % O2 Device: Room Air   Therapy/Group: Individual Therapy  Gypsy Decant 01/15/2019, 4:15 PM

## 2019-01-15 NOTE — Progress Notes (Signed)
Social Work Patient ID: Paul Pineda, male   DOB: 1945-11-29, 73 y.o.   MRN: [id number]   Have reviewed team conference with pt and wife.  Both aware and agreeable with targeted d/c date of 6/19 and mod ind - supervision goals overall.  Wife pleased with report.  Reviewed DME they already have and does not appear they will need anything further.  [place] set up with [facility].  Zaliah Wissner, LCSW

## 2019-01-15 NOTE — Patient Care Conference (Signed)
Inpatient RehabilitationTeam Conference and Plan of Care Update Date: 01/14/2019   Time: 1:50 PM    Patient Name: Paul Pineda      Medical Record Number: [id number]  Date of Birth: 01-Nov-1945 Sex: Male         Room/Bed: 4W25C/4W25C-01 Payor Info: Payor: [place] / Plan: [employer] MEDICARE / Product Type: *No Product type* /    Admitting Diagnosis: 1. SCI Team  Other Neuro, lumbar radiculopathy; 15-16days  Admit Date/Time:  01/07/2019  3:01 PM Admission Comments: No comment available   Primary Diagnosis:  <principal problem not specified> Principal Problem: <principal problem not specified>  Patient Active Problem List   Diagnosis Date Noted  . Lumbar radiculopathy 01/07/2019  . Lumbar herniated disc 01/06/2019  . HNP (herniated nucleus pulposus), lumbar 01/02/2019  . Aortic atherosclerosis ([place]) 10/09/2018  . 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 Sensorineural hearing loss (SNHL), bilateral 11/22/2017  . Tinnitus, bilateral 11/22/2017  . Syncope and collapse 08/15/2013  . Tachycardia 08/15/2013  . Dehydration 08/15/2013  . Postoperative anemia due to acute blood loss 07/15/2013  . OA (osteoarthritis) of knee 07/14/2013    Expected Discharge Date: Expected Discharge Date: 01/17/19  Team Members Present: Physician leading conference: Dr. Alger Simons Social Worker Present: Lennart Pall, LCSW Nurse Present: Judee Clara, LPN PT Present: Canary Brim, PT OT Present: Laverle Hobby, OT PPS Coordinator present : Gunnar Fusi, SLP     Current Status/Progress Goal Weekly Team Focus  Medical   lumbar stenosis with radiculopathy, s/p decompression and fusion. severe psoriasis. pain mgt  stabilize medically for discharge  skin care, wound care, pain mgt   Bowel/Bladder   continent of bowel & bladder, LBM 01/13/19  remain continent  monitor for changes & assist as needed   Swallow/Nutrition/ Hydration             ADL's   min A - CGA overall for self care  care tasks and functional transfers with use of RW - cuing to maintain precautions  supervision for toileting, min A overall - goals may need to be upgraded  d/c planning, endurance, strengthening, balance, self care retraining   Mobility   much clearer cognitively; S for transfers and gait wiht RW x 180'; CGA 2 steps with RW; s w/c; min cues for back precautions  S overall including gait x 150' iwht LRAD; min assist 3 steps with LRAD and supervision up/down 4 steps bil rails  pt ed, safety with precautions, transfers,locomotion, stairs, HEP, strengthening and flexibility   Communication             Safety/Cognition/ Behavioral Observations            Pain   c/o pain to lower back, has Tramadol & tylenol prn, Kpad ordered last night, but when it came refused it  pain scale <4/10  assess & treat as needed   Skin   psoriasis exacerbation, dry, peeling, cracking skin, lots of erythema, looks slightly better than last week, has eucerin, sarna, hydrocortisone mordered plus atarax prn, excoriation from scratching  less excoriation & dryness, no new areas of excoriation  assess q shift & moisturize as much as possible      *See Care Plan and progress notes for long and short-term goals.     Barriers to Discharge  Current Status/Progress Possible Resolutions Date Resolved   Physician    Medical stability;Wound Care        see medical progress notes  Nursing                  PT                    OT                  SLP                SW                Discharge Planning/Teaching Needs:  Pt to return home with spouse who can provide intermittent support IF he is able to reach mod ind goals (if goals upgraded) vs. possible need for SNF.  Teaching needs still TBD   Team Discussion:  Doing much better overall this week; MD suspects gout flare.  Plan to upgrade some goals to mod ind with anticipation that he could be left alone for periods of time. OT recommends supervision with b/d  due to still not always remembering his back precautions.    Revisions to Treatment Plan:  NA    Continued Need for Acute Rehabilitation Level of Care: The patient requires daily medical management by a physician with specialized training in physical medicine and rehabilitation for the following conditions: Daily direction of a multidisciplinary physical rehabilitation program to ensure safe treatment while eliciting the highest outcome that is of practical value to the patient.: Yes Daily medical management of patient stability for increased activity during participation in an intensive rehabilitation regime.: Yes Daily analysis of laboratory values and/or radiology reports with any subsequent need for medication adjustment of medical intervention for : Neurological problems;Post surgical problems;Other   I attest that I was present, lead the team conference, and concur with the assessment and plan of the team.   Lennart Pall 01/15/2019, 10:57 AM    Team conference was held via web/ teleconference due to [place] - 19

## 2019-01-15 NOTE — Progress Notes (Signed)
Occupational Therapy Session Note  Patient Details  Name: Paul Pineda MRN: [id number] Date of Birth: 06/08/1946  Today's Date: 01/15/2019 OT Individual Time: 4401-0272 OT Individual Time Calculation (min): 75 min    Short Term Goals: Week 1:  OT Short Term Goal 1 (Week 1): Pt will be able to complete squat pivot to toilet and or BSC with mod A of 1. OT Short Term Goal 2 (Week 1): Pt will be able to doff pants on BSC with min A using lateral leans and back precautions. OT Short Term Goal 3 (Week 1): Pt will be able to don pants on BSC with mod A using lateral leans and back precautions. OT Short Term Goal 4 (Week 1): Pt will be able to self cleanse on BSC with close S.  Skilled Therapeutic Interventions/Progress Updates:    Pt received in bed and ready for therapy.  He was able to get out of bed, used RW to ambulate in room to gather clothing,  Pt completed toileting, shower, and dressing all with distant S.  No loss of balance.  Pt demonstrated good activity tolerance and attention.  Pt ambulated with RW to gym with S and sat on mat for postural upper back exercises with theraband.  Pt completed 2 sets of 10 reps with back extension and overhead extension exercises.   He ambulated back to room with cues to keep arms closer by side to improve posture with ambulation. Pt resting in recliner with chair alarm on and all needs met.   Therapy Documentation Precautions:  Precautions Precautions: Fall, Back Precaution Booklet Issued: No Precaution Comments: Reviewed precautions; no brace Restrictions Weight Bearing Restrictions: No      Pain: Pain Assessment Pain Score: 0-No pain   Therapy/Group: Individual Therapy  [place] 01/15/2019, 12:33 PM

## 2019-01-15 NOTE — Progress Notes (Signed)
[facility] PHYSICAL MEDICINE & REHABILITATION PROGRESS NOTE   Subjective/Complaints: Pt pleased with progress. Excited about dc date  ROS: Patient denies fever, rash, sore throat, blurred vision, nausea, vomiting, diarrhea, cough, shortness of breath or chest pain, joint or back pain, headache, or mood change.     Objective:   Vas [place] Lower Extremity Venous (dvt)  Result Date: 01/14/2019  Lower Venous Study Indications: Follow up DVT from 5 days prior: left gastroc and post tib veins.  Comparison Study: 01/09/19 Performing Technologist: June Leap RDMS, RVT  Examination Guidelines: A complete evaluation includes B-mode imaging, spectral Doppler, color Doppler, and power Doppler as needed of all accessible portions of each vessel. Bilateral testing is considered an integral part of a complete examination. Limited examinations for reoccurring indications may be performed as noted.  +-----+---------------+---------+-----------+----------+-------+ RIGHTCompressibilityPhasicitySpontaneityPropertiesSummary +-----+---------------+---------+-----------+----------+-------+ CFV  Full           Yes      Yes                          +-----+---------------+---------+-----------+----------+-------+   +---------+---------------+---------+-----------+----------+-------+ LEFT     CompressibilityPhasicitySpontaneityPropertiesSummary +---------+---------------+---------+-----------+----------+-------+ CFV      Full           Yes      Yes                          +---------+---------------+---------+-----------+----------+-------+ SFJ      Full                                                 +---------+---------------+---------+-----------+----------+-------+ FV Prox  Full                                                 +---------+---------------+---------+-----------+----------+-------+ FV Mid   Full                                                  +---------+---------------+---------+-----------+----------+-------+ FV DistalFull                                                 +---------+---------------+---------+-----------+----------+-------+ PFV      Full                                                 +---------+---------------+---------+-----------+----------+-------+ POP      Full           Yes      Yes                          +---------+---------------+---------+-----------+----------+-------+ PTV      None                                                 +---------+---------------+---------+-----------+----------+-------+  PERO     Full                                                 +---------+---------------+---------+-----------+----------+-------+ Gastroc  None                                                 +---------+---------------+---------+-----------+----------+-------+     Summary: Right: No evidence of common femoral vein obstruction. Left: Findings consistent with continued deep vein thrombosis involving the left posterior tibial vein, and left gastocnemius vein.  *See table(s) above for measurements and observations. Electronically signed by Deitra Mayo MD on 01/14/2019 at 3:40:47 PM.    Final    No results for input(s): WBC, HGB, HCT, PLT in the last 72 hours. No results for input(s): NA, K, CL, CO2, GLUCOSE, BUN, CREATININE, CALCIUM in the last 72 hours.  Intake/Output Summary (Last 24 hours) at 01/15/2019 1241 Last data filed at 01/15/2019 1006 Gross per 24 hour  Intake 682 ml  Output 850 ml  Net -168 ml     Physical Exam: Vital Signs Blood pressure (!) 109/53, pulse 66, temperature 98.3 F (36.8 C), temperature source Oral, resp. rate 16, height 5\' 10"  (1.778 m), weight 89.3 kg, SpO2 99 %. Constitutional: No distress . Vital signs reviewed. HEENT: EOMI, oral membranes moist Neck: supple Cardiovascular: RRR without murmur. No JVD    Respiratory: CTA Bilaterally without  wheezes or rales. Normal effort    GI: BS +, non-tender, non-distended  Musculoskeletal:Normal range of motion.  no calf tenderness or swelling Neurological: Alert and appropriate.  UE 5/5. LE 3/5 HF, KE and 4/5 ADF/PF. Senses pain and LT in all 4's. DTR's trace to 1+.  Neurological exam is stable today.Skin:  Back incision clean dry intact..  Skin dry, red without change Psychiatric:  pleasant    Assessment/Plan: 1. 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  Physiatrist is providing close team supervision and 24 hour management of active medical problems listed below.  Physiatrist and rehab team continue to assess barriers to discharge/monitor patient progress toward functional and medical goals  Care Tool:  Bathing    Body parts bathed by patient: Right arm, Left arm, Chest, Abdomen, Front perineal area, Buttocks, Right upper leg, Left upper leg, Face, Right lower leg, Left lower leg   Body parts bathed by helper: Right lower leg, Left lower leg     Bathing assist Assist Level: Supervision/Verbal cueing     Upper Body Dressing/Undressing Upper body dressing   What is the patient wearing?: Button up shirt    Upper body assist Assist Level: Set up assist    Lower Body Dressing/Undressing Lower body dressing      What is the patient wearing?: Pants, Underwear/pull up     Lower body assist Assist for lower body dressing: Supervision/Verbal cueing     Toileting Toileting    Toileting assist Assist for toileting: Supervision/Verbal cueing Assistive Device Comment: urinal   Transfers Chair/bed transfer  Transfers assist     Chair/bed transfer assist level: Supervision/Verbal cueing     Locomotion Ambulation   Ambulation assist   Ambulation activity  did not occur: Safety/medical concerns(weakness; no +2  available)  Assist level: Supervision/Verbal cueing Assistive device: Walker-rolling Max distance: 150'   Walk 10 feet activity   Assist     Assist level: Supervision/Verbal cueing Assistive device: Walker-rolling   Walk 50 feet activity   Assist    Assist level: Supervision/Verbal cueing Assistive device: Walker-rolling    Walk 150 feet activity   Assist    Assist level: Supervision/Verbal cueing Assistive device: Walker-rolling    Walk 10 feet on uneven surface  activity   Assist Walk 10 feet on uneven surfaces activity did not occur: Safety/medical concerns(weakness)         Wheelchair     Assist Will patient use wheelchair at discharge?: No Type of Wheelchair: Manual    Wheelchair assist level: Supervision/Verbal cueing Max wheelchair distance: 150    Wheelchair 50 feet with 2 turns activity    Assist        Assist Level: Supervision/Verbal cueing   Wheelchair 150 feet activity     Assist     Assist Level: Supervision/Verbal cueing   Medical Problem List and Plan: 1.Decreased functional mobilitysecondary to HNP L2-3 with 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 Status post laminotomy L3-4 with microdissection nerve root decompression partial foraminotomies 01/02/2019. No back brace needed. -Continue CIR therapies including PT, OT  -ELOS 6/19 2. Antithrombotics: -DVT/anticoagulation: left posterior-tib/gastroc thrombus  -mobilize patient as much as possible  -still present on repeat dopplers  -encouraged activity, ambulation, leg pumps, etc    -SCD's -antiplatelet therapy: N/A 3. Pain Management:confusion over last 2  - Ultram as needed, Zanaflex 4 mg 3 times daily as needed.    -Right toe   gout flare improved after prednisone     4. Mood:Ativan 0.5 mg twice daily -antipsychotic agents: N/A 5. Neuropsych: This patientiscapable of making decisions on hisown  behalf. 6. Skin/Wound Care:Routine skin checks 7. Fluids/Electrolytes/Nutrition: encourage PO 8.Hypertension. Lisinopril 10 mg daily, Lopressor 25 mg twice daily.   -Blood pressure under control 6/17 10.Alcohol use.Counseling 11.Constipation. Laxative assistance 12. Psoriasis. Kenalog cream twice daily             -moisturize skin.    LOS: 8 days A FACE TO FACE EVALUATION WAS PERFORMED  Meredith Staggers 01/15/2019, 12:41 PM

## 2019-01-16 ENCOUNTER — Inpatient Hospital Stay (HOSPITAL_COMMUNITY): Payer: Medicare Other | Admitting: Occupational Therapy

## 2019-01-16 ENCOUNTER — Inpatient Hospital Stay (HOSPITAL_COMMUNITY): Payer: Medicare Other

## 2019-01-16 MED ORDER — ACETAMINOPHEN 325 MG PO TABS: 650.0000 mg | ORAL_TABLET | ORAL | Status: AC | PRN

## 2019-01-16 MED ORDER — HYDROXYZINE HCL 25 MG PO TABS: 25.0000 mg | ORAL_TABLET | Freq: Four times a day (QID) | ORAL | 0 refills | Status: AC | PRN

## 2019-01-16 MED ORDER — LORAZEPAM 0.5 MG PO TABS: 0.5000 mg | ORAL_TABLET | Freq: Two times a day (BID) | ORAL | 0 refills | Status: AC

## 2019-01-16 MED ORDER — CAMPHOR-MENTHOL 0.5-0.5 % EX LOTN: TOPICAL_LOTION | CUTANEOUS | 0 refills | Status: AC | PRN

## 2019-01-16 MED ORDER — TRAMADOL HCL 50 MG PO TABS: 50.0000 mg | ORAL_TABLET | Freq: Four times a day (QID) | ORAL | 1 refills | Status: AC | PRN

## 2019-01-16 MED ORDER — LISINOPRIL 10 MG PO TABS: 10.0000 mg | ORAL_TABLET | Freq: Every day | ORAL | 0 refills | Status: AC

## 2019-01-16 MED ORDER — TIZANIDINE HCL 4 MG PO TABS: 4.0000 mg | ORAL_TABLET | Freq: Three times a day (TID) | ORAL | 0 refills | Status: AC | PRN

## 2019-01-16 MED ORDER — METOPROLOL TARTRATE 25 MG PO TABS: 25.0000 mg | ORAL_TABLET | Freq: Two times a day (BID) | ORAL | 3 refills | Status: AC

## 2019-01-16 MED ORDER — HYDROCORTISONE 1 % EX CREA: TOPICAL_CREAM | Freq: Three times a day (TID) | CUTANEOUS | 0 refills | Status: AC

## 2019-01-16 MED ORDER — PANTOPRAZOLE SODIUM 40 MG PO TBEC: 40.0000 mg | DELAYED_RELEASE_TABLET | Freq: Every day | ORAL | 0 refills | Status: AC

## 2019-01-16 NOTE — Progress Notes (Signed)
Occupational Therapy Session Note  Patient Details  Name: Paul Pineda MRN: [id number] Date of Birth: November 08, 1945  Today's Date: 01/16/2019 OT Individual Time: 1400-1500 OT Individual Time Calculation (min): 60 min    Short Term Goals: Week 1:  OT Short Term Goal 1 (Week 1): Pt will be able to complete squat pivot to toilet and or BSC with mod A of 1. OT Short Term Goal 2 (Week 1): Pt will be able to doff pants on BSC with min A using lateral leans and back precautions. OT Short Term Goal 3 (Week 1): Pt will be able to don pants on BSC with mod A using lateral leans and back precautions. OT Short Term Goal 4 (Week 1): Pt will be able to self cleanse on BSC with close S.  Skilled Therapeutic Interventions/Progress Updates:    Pt seen for OT session focusing on functional mobility and activity tolerance. Pt asleep in supine upon arrival, easily awoken and agreeable to tx session, denying pain.  He ambulated throughout session with RW at mod I level, however, required VCs throughout session for appropriate RW management in functional context, however, pt with poor carryover/ following of cuing throughout session.  He completed toileting and hand hygiene task x3 during session, completed at mod I level though with inappropriate/unsafe use of RW.  He ambulated to [employer] demonstrating good functional activity tolerance. Seated rest break provided and discussed d/c planning, energy conservation and community re-entry.  Pt practiced retrieving items from vending machine, independently initiated use of reacher to retrieve item.  He returned to room at end of session, requesting return to supine. Pt left in supine with all needs in reach.  Pt able to recall 3/3 back pre-cautions, however, required cuing throughout session to carryover in functional context.   Therapy Documentation Precautions:  Precautions Precautions: Fall, Back Precaution Booklet Issued: No Precaution Comments: Reviewed  precautions; no brace Restrictions Weight Bearing Restrictions: (P) No   Therapy/Group: Individual Therapy  Paul Pineda L 01/16/2019, 7:17 AM

## 2019-01-16 NOTE — Progress Notes (Signed)
Physical Therapy Session Note  Patient Details  Name: Paul Pineda MRN: [id number] Date of Birth: Aug 30, 1945  Today's Date: 01/16/2019 PT Individual Time: 0801-0915 PT Individual Time Calculation (min): 74 min   Short Term Goals: Week 1:  PT Short Term Goal 1 (Week 1): pt will move supine> sit with supervision PT Short Term Goal 2 (Week 1): pt will transfer bed>< w/c with mod assist of 1 person PT Short Term Goal 3 (Week 1): pt will perform gait x 30'  with LRAD and 1 person PT Short Term Goal 4 (Week 1): pt will initiate stair training PT Short Term Goal 5 (Week 1): pt will propel w/c from room >< therapy room with distant supervision  Skilled Therapeutic Interventions/Progress Updates:     Patient in bed upon PT arrival. Patient alert and agreeable to PT session. Reported a gout flair-up in his R great toe, 1-2/10 pain, this morning. Patient able to state 3/3 spinal precautions at beginning of session.  Therapeutic Activity: Bed Mobility: Patient performed supine to/from sit independently performing a log roll to maintain back precautions without cueing from therapist.  Transfers: Patient performed sit to/from stand x8 with mod I using a RW with cues x1 for keeping back straight and bending at the hips to maintain back precautions.  He performed a car transfer set to the height of a mid-size sedan with supervision using the RW and a floor to chair transfer with supervision and min cues for maintaining back precautions and correct body mechanics. Patient was able to teach back fall safety including inspecting for injury or LOC and calling for emergency services if he is injured or is unable to get up by having his phone in his pocket at all times.   Gait Training:  Patient ambulated 150 feet using RW with mod I. Ambulated with decreased gait speed, decreased step length, increased hip and knee flexion, mild forward trunk lean, and intermittent downward head gaze. Limited ambulation  distance today due to R great toe pain. He went up/down 4 6" steps using a RW with close supervision for safety, going up forwards and down backwards. Educated on performing stairs at home with this technique due to not having rails and with someone with him at all times. He also ambulated over a ramp, 10' of unlevel surfaces, and up/down a 7" curb with close supervision for safety. Also educated on have someone with him when outside and/or in the community.  Neuromuscular Re-ed: Patient performed Berg Balance Scale, see below for details. Did not perform picking up an object or turning to look behind due to spinal precautions. Total 39/48, improved from 30/48 on 01/14/19.   Therapeutic Exercise: Patient performed the following exercises from his HEP with min verbal and tactile cues for proper technique. -B clam shells x10 -Hook-lying pelvic tilts x10 -B seated hamstring stretch x30 sec  Patient in recliner in room at end of session with breaks locked, chair alarm set, and all needs within reach. Educated on fall risk/prevention and safety in the home.   Therapy Documentation Precautions:  Precautions Precautions: Back, Fall Precaution Booklet Issued: No Precaution Comments: Reviewed precautions; no brace Restrictions Weight Bearing Restrictions: No Pain: Pain Assessment Pain Scale: 0-10 Pain Score: 4  Pain Type: Chronic pain Pain Location: Back Pain Orientation: Lower Pain Descriptors / Indicators: Aching Pain Intervention(s): Shower;Ambulation/increased activity  Balance: Standardized Balance Assessment Standardized Balance Assessment: Berg Balance Test Berg Balance Test Sit to Stand: Able to stand  independently using hands Standing  Unsupported: Able to stand safely 2 minutes Sitting with Back Unsupported but Feet Supported on Floor or Stool: Able to sit safely and securely 2 minutes Stand to Sit: Controls descent by using hands Transfers: Able to transfer safely, definite  need of hands Standing Unsupported with Eyes Closed: Able to stand 10 seconds safely Standing Ubsupported with Feet Together: Able to place feet together independently and stand 1 minute safely From Standing, Reach Forward with Outstretched Arm: Can reach forward >12 cm safely (5") From Standing Position, Pick up Object from Floor: Unable to try/needs assist to keep balance(unable due to back precautions) From Standing Position, Turn to Look Behind Over each Shoulder: Needs assist to keep from losing balance and falling(unable due to back precautions) Turn 360 Degrees: Able to turn 360 degrees safely but slowly Standing Unsupported, Alternately Place Feet on Step/Stool: Able to complete 4 steps without aid or supervision Standing Unsupported, One Foot in Front: Able to plae foot ahead of the other independently and hold 30 seconds Standing on One Leg: Able to lift leg independently and hold > 10 seconds Total Score: 39     Therapy/Group: Individual Therapy  Koray Soter L Tedi Hughson PT, DPT  01/16/2019, 12:58 PM

## 2019-01-16 NOTE — Progress Notes (Signed)
[place] PHYSICAL MEDICINE & REHABILITATION PROGRESS NOTE   Subjective/Complaints: Up with PT. No new issues  ROS: Patient denies fever, rash, sore throat, blurred vision, nausea, vomiting, diarrhea, cough, shortness of breath or chest pain, joint or back pain, headache, or mood change.    Objective:   Vas [place] Lower Extremity Venous (dvt)  Result Date: 01/14/2019  Lower Venous Study Indications: Follow up DVT from 5 days prior: left gastroc and post tib veins.  Comparison Study: 01/09/19 Performing Technologist: June Leap RDMS, RVT  Examination Guidelines: A complete evaluation includes B-mode imaging, spectral Doppler, color Doppler, and power Doppler as needed of all accessible portions of each vessel. Bilateral testing is considered an integral part of a complete examination. Limited examinations for reoccurring indications may be performed as noted.  +-----+---------------+---------+-----------+----------+-------+ RIGHTCompressibilityPhasicitySpontaneityPropertiesSummary +-----+---------------+---------+-----------+----------+-------+ CFV  Full           Yes      Yes                          +-----+---------------+---------+-----------+----------+-------+   +---------+---------------+---------+-----------+----------+-------+ LEFT     CompressibilityPhasicitySpontaneityPropertiesSummary +---------+---------------+---------+-----------+----------+-------+ CFV      Full           Yes      Yes                          +---------+---------------+---------+-----------+----------+-------+ SFJ      Full                                                 +---------+---------------+---------+-----------+----------+-------+ FV Prox  Full                                                 +---------+---------------+---------+-----------+----------+-------+ FV Mid   Full                                                  +---------+---------------+---------+-----------+----------+-------+ FV DistalFull                                                 +---------+---------------+---------+-----------+----------+-------+ PFV      Full                                                 +---------+---------------+---------+-----------+----------+-------+ POP      Full           Yes      Yes                          +---------+---------------+---------+-----------+----------+-------+ PTV      None                                                 +---------+---------------+---------+-----------+----------+-------+  PERO     Full                                                 +---------+---------------+---------+-----------+----------+-------+ Gastroc  None                                                 +---------+---------------+---------+-----------+----------+-------+     Summary: Right: No evidence of common femoral vein obstruction. Left: Findings consistent with continued deep vein thrombosis involving the left posterior tibial vein, and left gastocnemius vein.  *See table(s) above for measurements and observations. Electronically signed by Deitra Mayo MD on 01/14/2019 at 3:40:47 PM.    Final    No results for input(s): WBC, HGB, HCT, PLT in the last 72 hours. No results for input(s): NA, K, CL, CO2, GLUCOSE, BUN, CREATININE, CALCIUM in the last 72 hours.  Intake/Output Summary (Last 24 hours) at 01/16/2019 1017 Last data filed at 01/16/2019 0818 Gross per 24 hour  Intake 822 ml  Output 100 ml  Net 722 ml     Physical Exam: Vital Signs Blood pressure 134/81, pulse 73, temperature 98.9 F (37.2 C), resp. rate 18, height 5\' 10"  (1.778 m), weight 89.1 kg, SpO2 97 %. Constitutional: No distress . Vital signs reviewed. HEENT: EOMI, oral membranes moist Neck: supple Cardiovascular: RRR without murmur. No JVD    Respiratory: CTA Bilaterally without wheezes or rales. Normal  effort    GI: BS +, non-tender, non-distended  Musculoskeletal:Normal range of motion.  no calf tenderness or swelling Neurological: Alert and appropriate.  UE 5/5. LE 3/5 HF, KE and 4/5 ADF/PF. Senses pain and LT in all 4's. DTR's trace to 1+.  Neurological exam is stable today.Skin:  Back incision clean dry intact..  Skin dry, red without change Psychiatric:  pleasant    Assessment/Plan: 1. 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  Physiatrist is providing close team supervision and 24 hour management of active medical problems listed below.  Physiatrist and rehab team continue to assess barriers to discharge/monitor patient progress toward functional and medical goals  Care Tool:  Bathing    Body parts bathed by patient: Right arm, Left arm, Chest, Abdomen, Front perineal area, Buttocks, Right upper leg, Left upper leg, Face, Right lower leg, Left lower leg   Body parts bathed by helper: Right lower leg, Left lower leg     Bathing assist Assist Level: Supervision/Verbal cueing     Upper Body Dressing/Undressing Upper body dressing   What is the patient wearing?: Button up shirt    Upper body assist Assist Level: Set up assist    Lower Body Dressing/Undressing Lower body dressing      What is the patient wearing?: Pants, Underwear/pull up     Lower body assist Assist for lower body dressing: Supervision/Verbal cueing     Toileting Toileting    Toileting assist Assist for toileting: Supervision/Verbal cueing Assistive Device Comment: urinal   Transfers Chair/bed transfer  Transfers assist     Chair/bed transfer assist level: Supervision/Verbal cueing Chair/bed transfer assistive device: Programmer, multimedia   Ambulation assist   Ambulation activity did  not occur: Safety/medical  concerns(weakness; no +2 available)  Assist level: Supervision/Verbal cueing Assistive device: Walker-rolling Max distance: 180   Walk 10 feet activity   Assist     Assist level: Supervision/Verbal cueing Assistive device: Walker-rolling   Walk 50 feet activity   Assist    Assist level: Supervision/Verbal cueing Assistive device: Walker-rolling    Walk 150 feet activity   Assist    Assist level: Supervision/Verbal cueing Assistive device: Walker-rolling    Walk 10 feet on uneven surface  activity   Assist Walk 10 feet on uneven surfaces activity did not occur: Safety/medical concerns(weakness)         Wheelchair     Assist Will patient use wheelchair at discharge?: No Type of Wheelchair: Manual    Wheelchair assist level: Supervision/Verbal cueing Max wheelchair distance: 150    Wheelchair 50 feet with 2 turns activity    Assist        Assist Level: Supervision/Verbal cueing   Wheelchair 150 feet activity     Assist     Assist Level: Supervision/Verbal cueing   Medical Problem List and Plan: 1.Decreased functional mobilitysecondary to HNP L2-3 with 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 Status post laminotomy L3-4 with microdissection nerve root decompression partial foraminotomies 01/02/2019. No back brace needed. -Continue CIR therapies including PT, OT  -ELOS 6/19 2. Antithrombotics: -DVT/anticoagulation: left posterior-tib/gastroc thrombus  -mobilize patient as much as possible  -still present on repeat dopplers  -encouraged activity, ambulation, leg pumps, etc    -SCD's -antiplatelet therapy: N/A 3. Pain Management:confusion over last 2  - Ultram as needed, Zanaflex 4 mg 3 times daily as needed.    -Right toe   gout flare improved after prednisone     4. Mood:Ativan 0.5 mg twice daily -antipsychotic agents: N/A 5. Neuropsych: This patientiscapable of making  decisions on hisown behalf. 6. Skin/Wound Care:Routine skin checks 7. Fluids/Electrolytes/Nutrition: encourage PO 8.Hypertension. Lisinopril 10 mg daily, Lopressor 25 mg twice daily.   -Blood pressure under control 6/17 10.Alcohol use.Counseling 11.Constipation. Laxative assistance 12. Psoriasis. Kenalog cream twice daily             -moisturize skin.    LOS: 9 days A FACE TO FACE EVALUATION WAS PERFORMED  Meredith Staggers 01/16/2019, 10:17 AM

## 2019-01-16 NOTE — Progress Notes (Signed)
Physical Therapy Discharge Summary  Patient Details  Name: Paul Pineda MRN: [id number] Date of Birth: Mar 15, 1946    Patient has met 10 of 10 long term goals due to improved activity tolerance, improved balance, improved postural control, increased strength, decreased pain and ability to compensate for deficits.  Patient to discharge at an ambulatory level Modified Independent.   Patient's care partner unavailable for family education, but is available intermittently to provide the necessary physical assistance at discharge.  Recommendation:  Patient will benefit from ongoing skilled PT services in home health setting to continue to advance safe functional mobility, address ongoing impairments in balance, strength, activity tolerance, functional mobility, patient/family education, maintaining spinal precautions, and minimize fall risk.  Equipment: No equipment provided, patient has RW at home.  Reasons for discharge: treatment goals met  Patient/family agrees with progress made and goals achieved: Yes  PT Discharge Precautions/Restrictions Precautions Precautions: Back;Fall Precaution Comments: Reviewed precautions; no brace Restrictions Weight Bearing Restrictions: No Pain Pain Assessment Pain Scale: 0-10 Pain Score: 2 Pain Type: Chronic pain Pain Location: Back and R great toe Pain Orientation: Lower Pain Descriptors / Indicators: Aching Pain Intervention(s): reposition;distraction Vision/Perception  Perception Perception: Within Functional Limits Praxis Praxis: Intact  Cognition Overall Cognitive Status: No family/caregiver present to determine baseline cognitive functioning Orientation Level: Oriented X4 Attention: Sustained Memory: Impaired Memory Impairment: Decreased short term memory;Decreased recall of new information Decreased Short Term Memory: Verbal complex;Functional complex Awareness: Appears intact Problem Solving: Appears intact Safety/Judgment:  Appears intact Sensation Sensation Light Touch: Appears Intact(mild R great toe hypersensitivity secondary to Gout) Coordination Gross Motor Movements are Fluid and Coordinated: No Fine Motor Movements are Fluid and Coordinated: Yes Coordination and Movement Description: Decreased gorss motor movement secondary to decreased strength, activity tolerance, and back precautions. Finger Nose Finger Test: [facility] Motor  Motor Motor: Other (comment) Motor - Skilled Clinical Observations: Decreased secondary to decreased strenght, activity tolerance, and back precautions. Motor - Discharge Observations: Decreased secondary to decreased strenght, activity tolerance, and back precautions.  Mobility Bed Mobility Bed Mobility: Rolling Right;Rolling Left;Supine to Sit;Sit to Supine Rolling Right: Independent Rolling Left: Independent Supine to Sit: Independent Sit to Supine: Independent Transfers Transfers: Sit to Stand;Stand to Sit;Stand Pivot Transfers Sit to Stand: Independent with assistive device Stand to Sit: Independent with assistive device Stand Pivot Transfers: Independent with assistive device Transfer (Assistive device): Rolling walker Locomotion  Gait Ambulation: Yes Gait Assistance: Independent with assistive device Gait Distance (Feet): 150 Feet Assistive device: Rolling walker Gait Gait: Yes Gait Pattern: Decreased stride length;Step-through pattern;Decreased step length - right;Decreased hip/knee flexion - left;Decreased hip/knee flexion - right;Trunk flexed;Decreased trunk rotation Gait velocity: decreased Stairs / Additional Locomotion Stairs: Yes Stairs Assistance: Supervision/Verbal cueing Stair Management Technique: Two rails;Forwards;Alternating pattern;Step to pattern(alternating ascending, step-to descending) Number of Stairs: 12 Height of Stairs: 6 Ramp: Supervision/Verbal cueing Curb: Supervision/Verbal cueing Wheelchair Mobility Wheelchair Mobility: No   Trunk/Postural Assessment  Cervical Assessment Cervical Assessment: Exceptions to WFL(mild forward head) Thoracic Assessment Thoracic Assessment: Exceptions to WFL(mild kyphosis) Lumbar Assessment Lumbar Assessment: Exceptions to WFL(mild posterior pelvic tilt) Postural Control Postural Control: Deficits on evaluation Postural Limitations: decreased postural control and delayed righting reactions  Balance Standardized Balance Assessment Standardized Balance Assessment: Berg Balance Test Berg Balance Test Sit to Stand: Able to stand  independently using hands Standing Unsupported: Able to stand safely 2 minutes Sitting with Back Unsupported but Feet Supported on Floor or Stool: Able to sit safely and securely 2 minutes Stand to Sit: Controls descent by using hands  Transfers: Able to transfer safely, definite need of hands Standing Unsupported with Eyes Closed: Able to stand 10 seconds safely Standing Ubsupported with Feet Together: Able to place feet together independently and stand 1 minute safely From Standing, Reach Forward with Outstretched Arm: Can reach forward >12 cm safely (5") From Standing Position, Pick up Object from Floor: Unable to try/needs assist to keep balance(unable due to back precautions) From Standing Position, Turn to Look Behind Over each Shoulder: Needs assist to keep from losing balance and falling(unable due to back precautions) Turn 360 Degrees: Able to turn 360 degrees safely but slowly Standing Unsupported, Alternately Place Feet on Step/Stool: Able to complete 4 steps without aid or supervision Standing Unsupported, One Foot in Front: Able to plae foot ahead of the other independently and hold 30 seconds Standing on One Leg: Able to lift leg independently and hold > 10 seconds Total Score: 39 Static Sitting Balance Static Sitting - Level of Assistance: 7: Independent Dynamic Sitting Balance Dynamic Sitting - Level of Assistance: 6: Modified independent  (Device/Increase time) Sitting balance - Comments: uses reacher for reaching to floor, independent within back precauitons Static Standing Balance Static Standing - Level of Assistance: 7: Independent Dynamic Standing Balance Dynamic Standing - Level of Assistance: 6: Modified independent (Device/Increase time)(with RW during daily activities) Extremity Assessment  RUE Assessment RUE Assessment: Within Functional Limits Active Range of Motion (AROM) Comments: WFL for functional mobility General Strength Comments: WFL for functional mobility LUE Assessment LUE Assessment: Within Functional Limits Active Range of Motion (AROM) Comments: WFL for functional mobility General Strength Comments: WFL for functional mobility RLE Assessment RLE Assessment: Exceptions to [facility] Passive Range of Motion (PROM) Comments: tight hamstrings and heel cords Active Range of Motion (AROM) Comments: WFL for functional mobility General Strength Comments: Grossly in sitting: 5/5 throughout except hip flexion 4+/5 LLE Assessment LLE Assessment: Exceptions to [facility] Passive Range of Motion (PROM) Comments: tight hamstrings and heel cords Active Range of Motion (AROM) Comments: WFL for functional mobility General Strength Comments: Grossly in sitting: 4/5 throughout except DF/PF 5/5    Alayna Mabe L Alayne Estrella PT, DPT  01/16/2019, 12:49 PM

## 2019-01-16 NOTE — Progress Notes (Signed)
Occupational Therapy Discharge Summary  Patient Details  Name: MELINDA POTTINGER MRN: [id number] Date of Birth: 1945/08/27  Today's Date: 01/16/2019 OT Individual Time: 7741-2878 OT Individual Time Calculation (min): 62 min   Therapy session: patient seated in recliner and ready for therapy session.  Patient able to ambulate in room with RW to obtain items needed for adl and perform shower/dressing tasks with AD at a mod I level.  Reviewed safe techniques for reach and transport of items, fall hazards and home set up.  Patient demonstrates good understanding of back precautions.  Patient completed long distance ambulation on unit mod I, no LOB or issues with leg weakness.  Patient remained in room, seated in recliner at close of session, call bell in reach.   He states that he is prepared for discharge tomorrow.     Patient has met 10 of 10 long term goals due to improved activity tolerance, improved balance, ability to compensate for deficits and improved coordination.  Patient to discharge at overall Modified Independent level.    Reasons goals not met: na   Recommendation:  Patient will benefit from ongoing skilled OT services in outpatient setting to continue to advance functional skills in the area of BADL.  Equipment: No equipment provided  Reasons for discharge: treatment goals met  Patient/family agrees with progress made and goals achieved: Yes  OT Discharge Precautions/Restrictions  Precautions Precautions: Back;Fall Precaution Comments: Reviewed precautions; no brace Restrictions Weight Bearing Restrictions: No General   Vital Signs   Pain Pain Assessment Pain Scale: 0-10 Pain Score: 4  Pain Type: Chronic pain Pain Location: Back Pain Orientation: Lower Pain Descriptors / Indicators: Aching Pain Intervention(s): Shower;Ambulation/increased activity ADL ADL Equipment Provided: Reacher, Long-handled sponge Eating: Independent Where Assessed-Eating:  Chair Grooming: Independent Where Assessed-Grooming: Standing at sink Upper Body Bathing: Independent Where Assessed-Upper Body Bathing: Shower Lower Body Bathing: Modified independent Where Assessed-Lower Body Bathing: Shower Upper Body Dressing: Independent Where Assessed-Upper Body Dressing: Edge of bed Lower Body Dressing: Modified independent Where Assessed-Lower Body Dressing: Edge of bed Toileting: Independent Where Assessed-Toileting: Glass blower/designer: Diplomatic Services operational officer Method: Counselling psychologist: Energy manager: Chief Financial Officer Method: Heritage manager: Gaffer Baseline Vision/History: Wears glasses Patient Visual Report: No change from baseline Perception  Perception: Within Functional Limits Praxis Praxis: Intact Cognition Overall Cognitive Status: Within Functional Limits for tasks assessed Arousal/Alertness: Awake/alert Orientation Level: Oriented X4 Attention: Sustained Focused Attention: Appears intact Sustained Attention: Appears intact Memory: Appears intact Awareness: Appears intact Problem Solving: Appears intact Safety/Judgment: Appears intact Sensation Sensation Light Touch: Appears Intact Hot/Cold: Appears Intact Coordination Fine Motor Movements are Fluid and Coordinated: Yes Finger Nose Finger Test: [facility] Motor  Motor Motor - Discharge Observations: Decreased secondary to decreased strenght, activity tolerance, and back precautions. Mobility     Trunk/Postural Assessment  Postural Control Postural Limitations: decreased postural control and delayed righting reactions  Balance Standardized Balance Assessment Standardized Balance Assessment: Berg Balance Test Berg Balance Test Sit to Stand: Able to stand  independently using hands Standing Unsupported: Able to stand safely 2 minutes Sitting with Back Unsupported  but Feet Supported on Floor or Stool: Able to sit safely and securely 2 minutes Stand to Sit: Controls descent by using hands Transfers: Able to transfer safely, definite need of hands Standing Unsupported with Eyes Closed: Able to stand 10 seconds safely Standing Ubsupported with Feet Together: Able to place feet together independently and stand 1 minute safely From  Standing, Reach Forward with Outstretched Arm: Can reach forward >12 cm safely (5") From Standing Position, Pick up Object from Floor: Unable to try/needs assist to keep balance(unable due to back precautions) From Standing Position, Turn to Look Behind Over each Shoulder: Needs assist to keep from losing balance and falling(unable due to back precautions) Turn 360 Degrees: Able to turn 360 degrees safely but slowly Standing Unsupported, Alternately Place Feet on Step/Stool: Able to complete 4 steps without aid or supervision Standing Unsupported, One Foot in Front: Able to plae foot ahead of the other independently and hold 30 seconds Standing on One Leg: Able to lift leg independently and hold > 10 seconds Total Score: 39 Static Sitting Balance Static Sitting - Level of Assistance: 7: Independent Dynamic Sitting Balance Dynamic Sitting - Level of Assistance: 6: Modified independent (Device/Increase time) Sitting balance - Comments: uses reacher for reaching to floor, independent within back precauitons Static Standing Balance Static Standing - Level of Assistance: 7: Independent Dynamic Standing Balance Dynamic Standing - Level of Assistance: 6: Modified independent (Device/Increase time) Extremity/Trunk Assessment RUE Assessment RUE Assessment: Within Functional Limits LUE Assessment LUE Assessment: Within Functional Limits   Carlos Levering 01/16/2019, 12:35 PM

## 2019-01-16 NOTE — Discharge Instructions (Signed)
Inpatient Rehab Discharge Instructions  HUDSYN CHAMPINE Discharge date and time: No discharge date for patient encounter.   Activities/Precautions/ Functional Status: Activity: activity as tolerated Diet: regular diet Wound Care: keep wound clean and dry Functional status:  ___ No restrictions     ___ Walk up steps independently ___ 24/7 supervision/assistance   ___ Walk up steps with assistance ___ Intermittent supervision/assistance  ___ Bathe/dress independently ___ Walk with walker     _x__ Bathe/dress with assistance ___ Walk Independently    ___ Shower independently ___ Walk with assistance    ___ Shower with assistance ___ No alcohol     ___ Return to work/school ________    COMMUNITY REFERRALS UPON DISCHARGE:    Home Health:   PT     OT                      Agency:  [facility]     Phone:  [phone]       Special Instructions: No driving, smoking or alcohol   My questions have been answered and I understand these instructions. I will adhere to these goals and the provided educational materials after my discharge from the hospital.  Patient/Caregiver Signature _______________________________ Date __________  Clinician Signature _______________________________________ Date __________  Please bring this form and your medication list with you to all your follow-up doctor's appointments.

## 2019-01-17 ENCOUNTER — Telehealth (HOSPITAL_COMMUNITY): Payer: Self-pay | Admitting: *Deleted

## 2019-01-17 DIAGNOSIS — I824Z2 Acute embolism and thrombosis of unspecified deep veins of left distal lower extremity: Secondary | ICD-10-CM

## 2019-01-17 NOTE — Progress Notes (Signed)
Patient was D/C today with all personal belongings. All questions were answered and patient verbalized understanding of attending follow up appts.Patient and belongings were taken down where patient left with wife. Doy Hutching, LPN

## 2019-01-17 NOTE — Telephone Encounter (Signed)
01/17/19 attempted to call pt to schedule appointment.

## 2019-01-17 NOTE — Progress Notes (Signed)
[facility] MEDICINE [facility] REHABILITATION PROGRESS NOTE   Subjective/Complaints: Up in bed. Feeling well  ROS: Patient denies fever, rash, sore throat, blurred vision, nausea, vomiting, diarrhea, cough, shortness of breath or chest pain, joint or back pain, headache, or mood change.    Objective:   No results found. No results for input(s): WBC, HGB, HCT, PLT in the last 72 hours. No results for input(s): NA, K, CL, CO2, GLUCOSE, BUN, CREATININE, CALCIUM in the last 72 hours.  Intake/Output Summary (Last 24 hours) at 01/17/2019 0914 Last data filed at 01/17/2019 0735 Gross per 24 hour  Intake 740 ml  Output 500 ml  Net 240 ml     Physical Exam: Vital Signs Blood pressure 136/88, pulse 78, temperature 98.5 F (36.9 C), resp. rate 20, height 5\' 10"  (1.778 m), weight 89.1 kg, SpO2 100 %. Constitutional: No distress . Vital signs reviewed. HEENT: EOMI, oral membranes moist Neck: supple Cardiovascular: RRR without murmur. No JVD    Respiratory: CTA Bilaterally without wheezes or rales. Normal effort    GI: BS +, non-tender, non-distended  Musculoskeletal:Normal range of motion.  no calf tenderness or swelling Neurological: Alert and appropriate.  UE 5/5. LE 3/5 HF, KE and 4/5 ADF/PF. Senses pain and LT in all 4's. DTR's trace to 1+.  Neurological exam is stable today.Skin:  Back incision clean dry intact..  Skin remains dry, red without change Psychiatric:  pleasant    Assessment/Plan: 1. 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  Physiatrist is providing close team supervision and 24 hour management of active medical problems listed below.  Physiatrist and rehab team continue to assess barriers to discharge/monitor patient progress toward functional and medical goals  Care Tool:  Bathing    Body parts bathed by patient:  Right arm, Left arm, Chest, Abdomen, Front perineal area, Buttocks, Right upper leg, Left upper leg, Face, Right lower leg, Left lower leg   Body parts bathed by helper: Right lower leg, Left lower leg     Bathing assist Assist Level: Independent with assistive device Assistive Device Comment: long handled bath sponge   Upper Body Dressing/Undressing Upper body dressing   What is the patient wearing?: Button up shirt    Upper body assist Assist Level: Independent    Lower Body Dressing/Undressing Lower body dressing      What is the patient wearing?: Underwear/pull up, Pants     Lower body assist Assist for lower body dressing: Independent with assitive device Assistive Device Comment: reacher   Toileting Toileting    Toileting assist Assist for toileting: Independent with assistive device Assistive Device Comment: urinal   Transfers Chair/bed transfer  Transfers assist     Chair/bed transfer assist level: Independent with assistive device Chair/bed transfer assistive device: Programmer, multimedia   Ambulation assist   Ambulation activity did not occur: Safety/medical concerns(weakness; no +2 available)  Assist level: Independent with assistive device Assistive device: Walker-rolling Max distance: 150'   Walk 10 feet activity   Assist     Assist level: Independent with assistive device Assistive device: Walker-rolling   Walk 50 feet activity   Assist    Assist level: Independent with assistive device Assistive device: Walker-rolling    Walk 150 feet activity   Assist    Assist level: Independent with assistive device Assistive device: Walker-rolling    Walk 10 feet on uneven surface  activity  Assist Walk 10 feet on uneven surfaces activity did not occur: Safety/medical concerns(weakness)   Assist level: Supervision/Verbal cueing Assistive device: Aeronautical engineer Will patient use wheelchair  at discharge?: No Type of Wheelchair: Manual Wheelchair activity did not occur: N/A  Wheelchair assist level: Supervision/Verbal cueing Max wheelchair distance: 150    Wheelchair 50 feet with 2 turns activity    Assist    Wheelchair 50 feet with 2 turns activity did not occur: N/A   Assist Level: Supervision/Verbal cueing   Wheelchair 150 feet activity     Assist Wheelchair 150 feet activity did not occur: N/A   Assist Level: Supervision/Verbal cueing   Medical Problem List and Plan: 1.Decreased functional mobilitysecondary to HNP L2-3 with 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 Status post laminotomy L3-4 with microdissection nerve root decompression partial foraminotomies 01/02/2019. No back brace needed. -Continue CIR therapies including PT, OT  -ELOS 6/19 2. Antithrombotics: -DVT/anticoagulation: left posterior-tib/gastroc thrombus  -should be fine as long as he maintains activity, ambulation as he's doing. Reiterated that with patient today  -do recommend f/u doppler in a month to assure resolution    -antiplatelet therapy: N/A 3. Pain Management:confusion over last 2  - Ultram as needed, Zanaflex 4 mg 3 times daily as needed.    -Right toe   gout flare improved after prednisone     4. Mood:Ativan 0.5 mg twice daily -antipsychotic agents: N/A 5. Neuropsych: This patientiscapable of making decisions on hisown behalf. 6. Skin/Wound Care:Routine skin checks 7. Fluids/Electrolytes/Nutrition: encourage PO 8.Hypertension. Lisinopril 10 mg daily, Lopressor 25 mg twice daily.   -Blood pressure under control 6/19 10.Alcohol use.Counseling 11.Constipation. Laxative assistance 12. Psoriasis. Kenalog cream twice daily             -moisturize skin.    LOS: 10 days A FACE TO FACE EVALUATION WAS PERFORMED  Meredith Staggers 01/17/2019, 9:14 AM

## 2019-01-17 NOTE — Progress Notes (Signed)
Social Work  Discharge Note  The overall goal for the admission was met for:   Discharge location: Yes - home with wife who can provide intermittent assistance  Length of Stay: Yes - 10 days  Discharge activity level: Yes - supervision to modified independent  Home/community participation: Yes  Services provided included: MD, RD, PT, OT, RN, Pharmacy and [place]: Medicare  Follow-up services arranged: Home Health: PT, OT via [facility] and Patient/Family has no preference for HH/DME agencies  Comments (or additional information):       Contact info:  Pt @ [phone]    Wife, [name] @ [phone]  Patient/Family verbalized understanding of follow-up arrangements: Yes  Individual responsible for coordination of the follow-up plan: pt  Confirmed correct DME delivered:  NA - had all needed DME    Paul Pineda

## 2019-01-22 DIAGNOSIS — L409 Psoriasis, unspecified: Secondary | ICD-10-CM | POA: Diagnosis not present

## 2019-01-22 DIAGNOSIS — L4050 Arthropathic psoriasis, unspecified: Secondary | ICD-10-CM | POA: Diagnosis not present

## 2019-01-22 DIAGNOSIS — R5383 Other fatigue: Secondary | ICD-10-CM | POA: Diagnosis not present

## 2019-01-22 DIAGNOSIS — M150 Primary generalized (osteo)arthritis: Secondary | ICD-10-CM | POA: Diagnosis not present

## 2019-01-24 DIAGNOSIS — E785 Hyperlipidemia, unspecified: Secondary | ICD-10-CM | POA: Diagnosis not present

## 2019-01-24 DIAGNOSIS — H903 Sensorineural hearing loss, bilateral: Secondary | ICD-10-CM | POA: Diagnosis not present

## 2019-01-24 DIAGNOSIS — M5116 Intervertebral disc disorders with radiculopathy, lumbar region: Secondary | ICD-10-CM | POA: Diagnosis not present

## 2019-01-24 DIAGNOSIS — I10 Essential (primary) hypertension: Secondary | ICD-10-CM | POA: Diagnosis not present

## 2019-01-24 DIAGNOSIS — H9313 Tinnitus, bilateral: Secondary | ICD-10-CM | POA: Diagnosis not present

## 2019-01-24 DIAGNOSIS — M1990 Unspecified osteoarthritis, unspecified site: Secondary | ICD-10-CM | POA: Diagnosis not present

## 2019-01-24 DIAGNOSIS — M1710 Unilateral primary osteoarthritis, unspecified knee: Secondary | ICD-10-CM | POA: Diagnosis not present

## 2019-01-24 DIAGNOSIS — Z4789 Encounter for other orthopedic aftercare: Secondary | ICD-10-CM | POA: Diagnosis not present

## 2019-01-24 DIAGNOSIS — F1010 Alcohol abuse, uncomplicated: Secondary | ICD-10-CM | POA: Diagnosis not present

## 2019-01-24 DIAGNOSIS — I700 Atherosclerosis of aorta: Secondary | ICD-10-CM | POA: Diagnosis not present

## 2019-01-24 DIAGNOSIS — L4050 Arthropathic psoriasis, unspecified: Secondary | ICD-10-CM | POA: Diagnosis not present

## 2019-01-24 DIAGNOSIS — M48061 Spinal stenosis, lumbar region without neurogenic claudication: Secondary | ICD-10-CM | POA: Diagnosis not present

## 2019-01-24 DIAGNOSIS — K5900 Constipation, unspecified: Secondary | ICD-10-CM | POA: Diagnosis not present

## 2019-02-02 DIAGNOSIS — M4800 Spinal stenosis, site unspecified: Secondary | ICD-10-CM | POA: Diagnosis not present

## 2019-02-05 DIAGNOSIS — Z09 Encounter for follow-up examination after completed treatment for conditions other than malignant neoplasm: Secondary | ICD-10-CM | POA: Diagnosis not present

## 2019-02-05 DIAGNOSIS — M5416 Radiculopathy, lumbar region: Secondary | ICD-10-CM | POA: Diagnosis not present

## 2019-02-06 ENCOUNTER — Telehealth: Payer: Self-pay | Admitting: Physical Medicine & Rehabilitation

## 2019-02-06 NOTE — Telephone Encounter (Signed)
VVSW CALLED states patient needs to wait on requested procedure due to money

## 2019-02-10 NOTE — Telephone Encounter (Signed)
This can be put off for now, but ultimately it needs to be done in the next few weeks to assess his blood clot. This isn't trivial.

## 2019-03-03 ENCOUNTER — Inpatient Hospital Stay: Payer: Medicare Other | Attending: Oncology

## 2019-03-05 DIAGNOSIS — D1801 Hemangioma of skin and subcutaneous tissue: Secondary | ICD-10-CM | POA: Diagnosis not present

## 2019-03-05 DIAGNOSIS — M4800 Spinal stenosis, site unspecified: Secondary | ICD-10-CM | POA: Diagnosis not present

## 2019-03-05 DIAGNOSIS — C44629 Squamous cell carcinoma of skin of left upper limb, including shoulder: Secondary | ICD-10-CM | POA: Diagnosis not present

## 2019-03-05 DIAGNOSIS — L400 Psoriasis vulgaris: Secondary | ICD-10-CM | POA: Diagnosis not present

## 2019-03-05 DIAGNOSIS — Z85828 Personal history of other malignant neoplasm of skin: Secondary | ICD-10-CM | POA: Diagnosis not present

## 2019-03-05 DIAGNOSIS — C44619 Basal cell carcinoma of skin of left upper limb, including shoulder: Secondary | ICD-10-CM | POA: Diagnosis not present

## 2019-03-05 DIAGNOSIS — C44222 Squamous cell carcinoma of skin of right ear and external auricular canal: Secondary | ICD-10-CM | POA: Diagnosis not present

## 2019-03-05 DIAGNOSIS — L0889 Other specified local infections of the skin and subcutaneous tissue: Secondary | ICD-10-CM | POA: Diagnosis not present

## 2019-03-05 DIAGNOSIS — L309 Dermatitis, unspecified: Secondary | ICD-10-CM | POA: Diagnosis not present

## 2019-03-06 ENCOUNTER — Telehealth: Payer: Self-pay | Admitting: Oncology

## 2019-03-06 NOTE — Telephone Encounter (Signed)
Returned call re message left to cancel 8/10 appointment. Not able to reach patient. Left message confirming cancellation and asked that patient call back at his convenience to reschedule.

## 2019-03-10 ENCOUNTER — Inpatient Hospital Stay: Payer: Medicare Other | Admitting: Oncology

## 2019-04-09 DIAGNOSIS — C44222 Squamous cell carcinoma of skin of right ear and external auricular canal: Secondary | ICD-10-CM | POA: Diagnosis not present

## 2019-04-09 DIAGNOSIS — Z85828 Personal history of other malignant neoplasm of skin: Secondary | ICD-10-CM | POA: Diagnosis not present

## 2019-04-09 DIAGNOSIS — C44529 Squamous cell carcinoma of skin of other part of trunk: Secondary | ICD-10-CM | POA: Diagnosis not present

## 2019-04-17 DIAGNOSIS — C44619 Basal cell carcinoma of skin of left upper limb, including shoulder: Secondary | ICD-10-CM | POA: Diagnosis not present

## 2019-04-17 DIAGNOSIS — Z85828 Personal history of other malignant neoplasm of skin: Secondary | ICD-10-CM | POA: Diagnosis not present

## 2019-05-02 DIAGNOSIS — L400 Psoriasis vulgaris: Secondary | ICD-10-CM | POA: Diagnosis not present

## 2019-05-02 DIAGNOSIS — L011 Impetiginization of other dermatoses: Secondary | ICD-10-CM | POA: Diagnosis not present

## 2019-05-02 DIAGNOSIS — L0889 Other specified local infections of the skin and subcutaneous tissue: Secondary | ICD-10-CM | POA: Diagnosis not present

## 2019-05-02 DIAGNOSIS — D044 Carcinoma in situ of skin of scalp and neck: Secondary | ICD-10-CM | POA: Diagnosis not present

## 2019-05-02 DIAGNOSIS — C44629 Squamous cell carcinoma of skin of left upper limb, including shoulder: Secondary | ICD-10-CM | POA: Diagnosis not present

## 2019-05-02 DIAGNOSIS — C4442 Squamous cell carcinoma of skin of scalp and neck: Secondary | ICD-10-CM | POA: Diagnosis not present

## 2019-05-02 DIAGNOSIS — Z85828 Personal history of other malignant neoplasm of skin: Secondary | ICD-10-CM | POA: Diagnosis not present

## 2019-05-02 DIAGNOSIS — C44519 Basal cell carcinoma of skin of other part of trunk: Secondary | ICD-10-CM | POA: Diagnosis not present

## 2019-06-02 DIAGNOSIS — C4442 Squamous cell carcinoma of skin of scalp and neck: Secondary | ICD-10-CM | POA: Diagnosis not present

## 2019-06-02 DIAGNOSIS — Z85828 Personal history of other malignant neoplasm of skin: Secondary | ICD-10-CM | POA: Diagnosis not present

## 2019-06-10 DIAGNOSIS — C44519 Basal cell carcinoma of skin of other part of trunk: Secondary | ICD-10-CM | POA: Diagnosis not present

## 2019-06-10 DIAGNOSIS — Z85828 Personal history of other malignant neoplasm of skin: Secondary | ICD-10-CM | POA: Diagnosis not present

## 2019-06-16 DIAGNOSIS — Z5181 Encounter for therapeutic drug level monitoring: Secondary | ICD-10-CM | POA: Diagnosis not present

## 2019-06-16 DIAGNOSIS — L409 Psoriasis, unspecified: Secondary | ICD-10-CM | POA: Diagnosis not present

## 2019-06-16 DIAGNOSIS — L4050 Arthropathic psoriasis, unspecified: Secondary | ICD-10-CM | POA: Diagnosis not present

## 2019-06-16 DIAGNOSIS — Z85828 Personal history of other malignant neoplasm of skin: Secondary | ICD-10-CM | POA: Diagnosis not present

## 2019-06-17 DIAGNOSIS — D485 Neoplasm of uncertain behavior of skin: Secondary | ICD-10-CM | POA: Diagnosis not present

## 2019-06-17 DIAGNOSIS — C44622 Squamous cell carcinoma of skin of right upper limb, including shoulder: Secondary | ICD-10-CM | POA: Diagnosis not present

## 2019-06-20 DIAGNOSIS — Z5181 Encounter for therapeutic drug level monitoring: Secondary | ICD-10-CM | POA: Diagnosis not present

## 2019-06-20 DIAGNOSIS — L409 Psoriasis, unspecified: Secondary | ICD-10-CM | POA: Diagnosis not present

## 2019-06-27 DIAGNOSIS — L409 Psoriasis, unspecified: Secondary | ICD-10-CM | POA: Diagnosis not present

## 2019-07-14 IMAGING — CR LUMBAR SPINE - 2-3 VIEW
2 series · 2 of 2 positions shown · non-contrast
Comparison: MRI 12/29/2018.

CLINICAL DATA: Micro discectomy.

EXAM:
LUMBAR SPINE - 2-3 VIEW

[xtable lateral (1 of 2)]
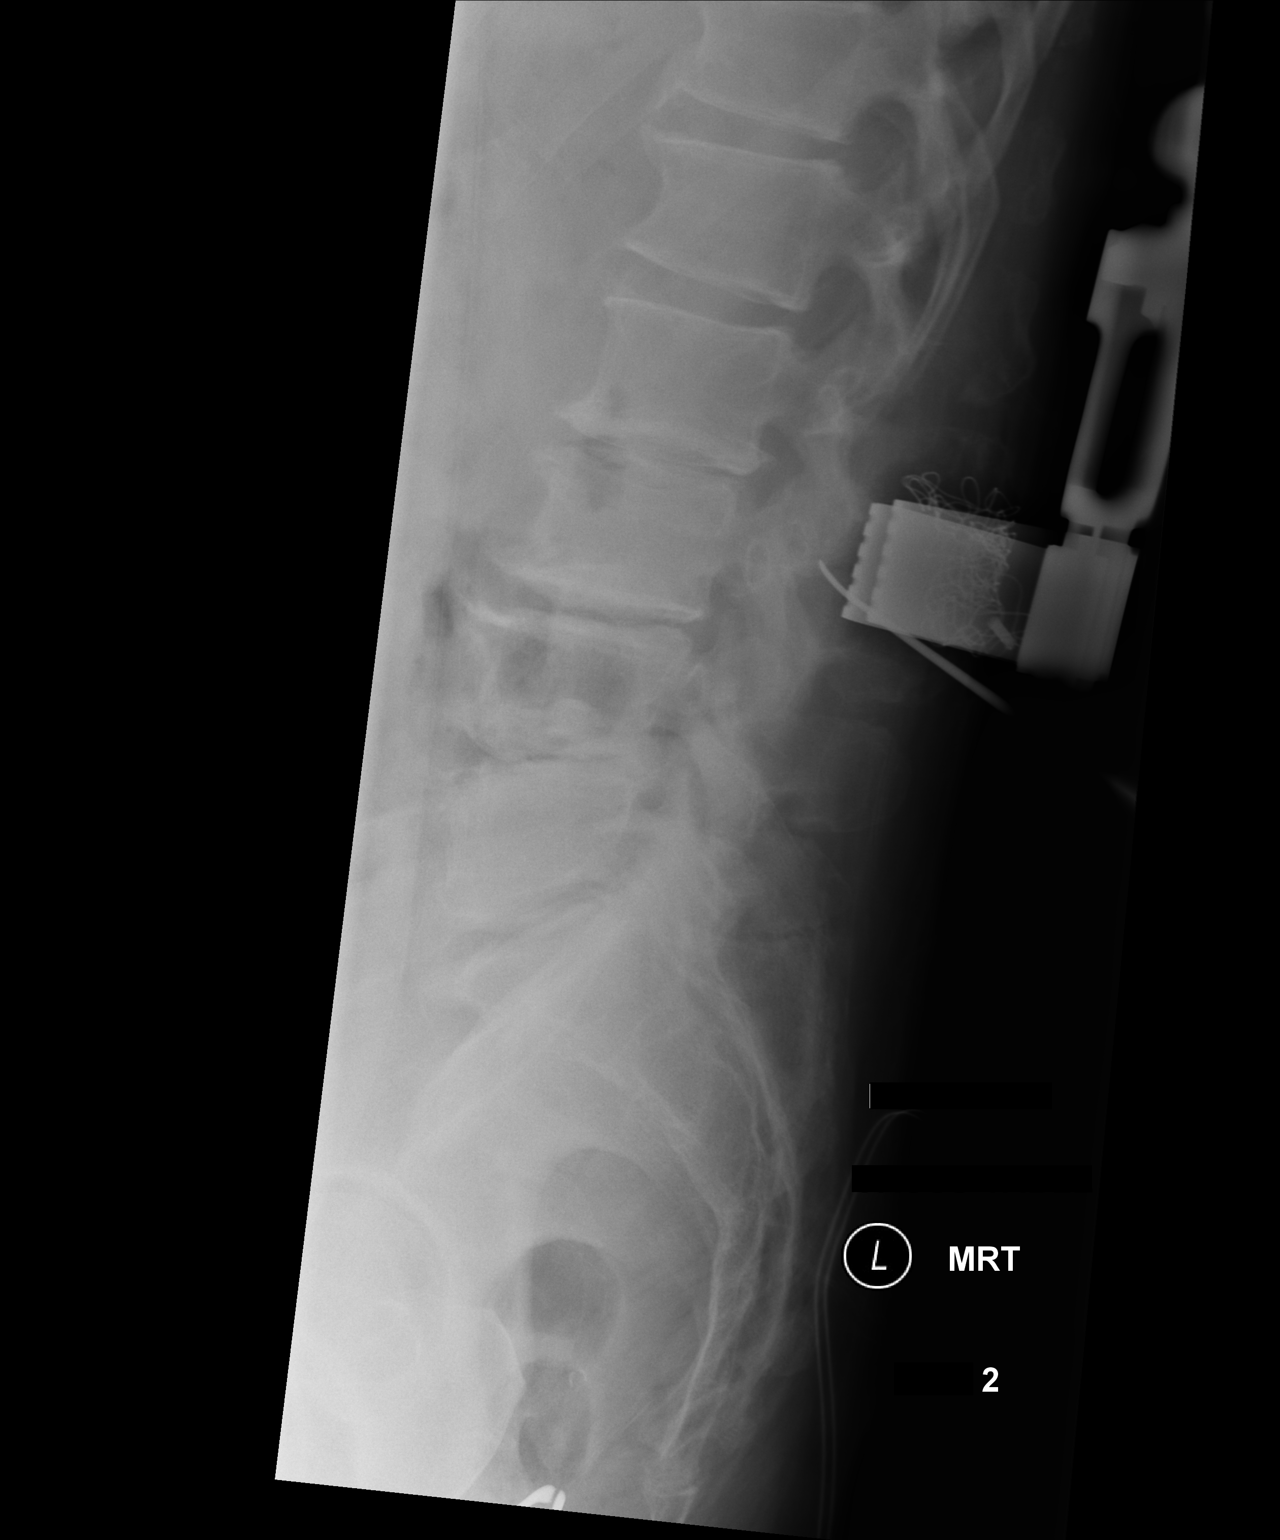

[xtable lateral (2 of 2)]
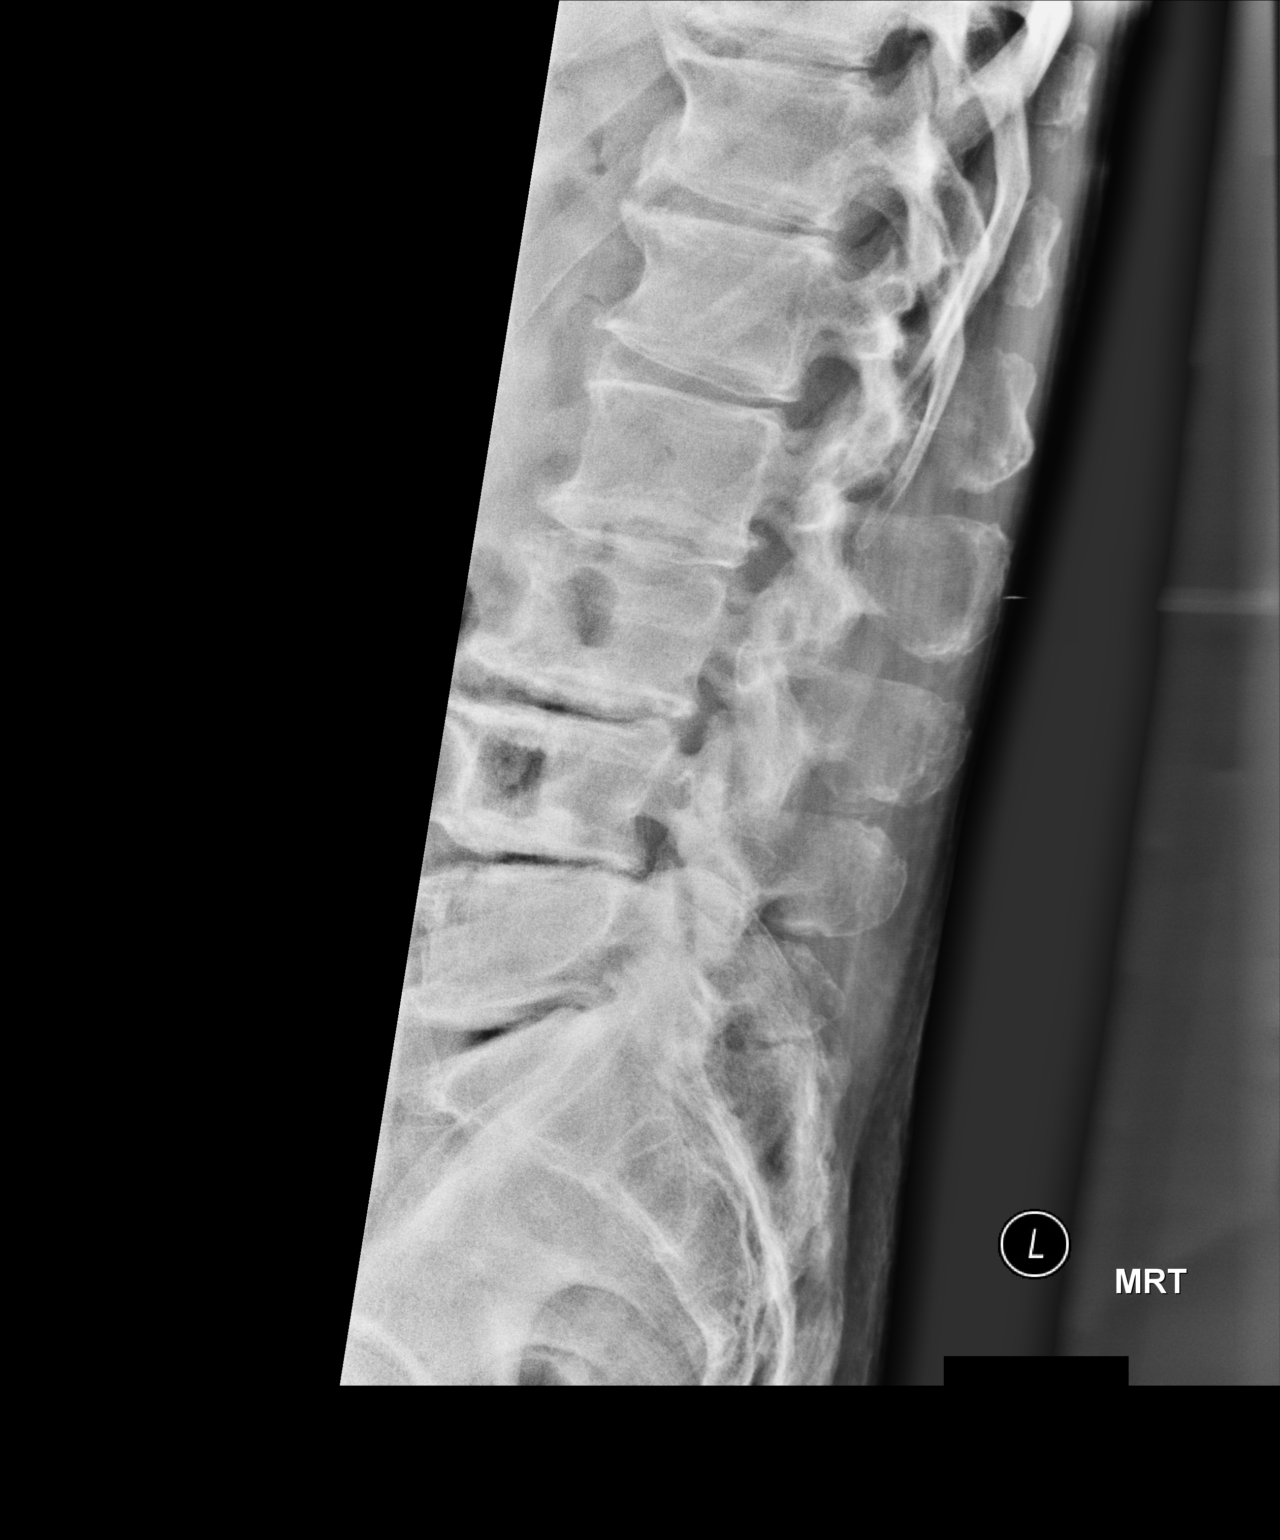

[2 of 2 positions shown; findings below may reference images not displayed]

FINDINGS: Lumbar spine numbered as per prior MRI. Metallic marker noted
posteriorly at the level of L3. Diffuse multilevel degenerative
change. No acute abnormality identified.
IMPRESSION: Metallic marker noted posteriorly at the level of L3.

## 2019-07-17 DIAGNOSIS — Z5181 Encounter for therapeutic drug level monitoring: Secondary | ICD-10-CM | POA: Diagnosis not present

## 2019-07-17 DIAGNOSIS — Z79899 Other long term (current) drug therapy: Secondary | ICD-10-CM | POA: Diagnosis not present

## 2019-07-17 DIAGNOSIS — L408 Other psoriasis: Secondary | ICD-10-CM | POA: Diagnosis not present

## 2019-07-17 DIAGNOSIS — Z7289 Other problems related to lifestyle: Secondary | ICD-10-CM | POA: Diagnosis not present

## 2019-07-17 DIAGNOSIS — L299 Pruritus, unspecified: Secondary | ICD-10-CM | POA: Diagnosis not present

## 2019-07-17 DIAGNOSIS — M2550 Pain in unspecified joint: Secondary | ICD-10-CM | POA: Diagnosis not present

## 2019-07-17 DIAGNOSIS — L409 Psoriasis, unspecified: Secondary | ICD-10-CM | POA: Diagnosis not present

## 2019-08-01 DEATH — deceased

## 2019-08-20 NOTE — Telephone Encounter (Signed)
No entry
